# Patient Record
Sex: Male | Born: 1970 | Race: White | Hispanic: No | Marital: Married | Smoking: Never smoker
Health system: Southern US, Community
[De-identification: ages and names within clinical notes are randomized; demographics above are authoritative.]

## PROBLEM LIST (undated history)

## (undated) DIAGNOSIS — M545 Low back pain, unspecified: Secondary | ICD-10-CM

## (undated) DIAGNOSIS — M5136 Other intervertebral disc degeneration, lumbar region: Secondary | ICD-10-CM

## (undated) DIAGNOSIS — M51369 Other intervertebral disc degeneration, lumbar region without mention of lumbar back pain or lower extremity pain: Secondary | ICD-10-CM

## (undated) DIAGNOSIS — I10 Essential (primary) hypertension: Secondary | ICD-10-CM

## (undated) DIAGNOSIS — G43909 Migraine, unspecified, not intractable, without status migrainosus: Secondary | ICD-10-CM

## (undated) DIAGNOSIS — E669 Obesity, unspecified: Secondary | ICD-10-CM

## (undated) DIAGNOSIS — F0781 Postconcussional syndrome: Secondary | ICD-10-CM

## (undated) HISTORY — DX: Obesity, unspecified: E66.9

## (undated) HISTORY — DX: Low back pain, unspecified: M54.50

## (undated) HISTORY — DX: Migraine, unspecified, not intractable, without status migrainosus: G43.909

## (undated) HISTORY — DX: Postconcussional syndrome: F07.81

## (undated) HISTORY — DX: Other intervertebral disc degeneration, lumbar region without mention of lumbar back pain or lower extremity pain: M51.369

## (undated) HISTORY — DX: Essential (primary) hypertension: I10

## (undated) HISTORY — DX: Low back pain: M54.5

## (undated) HISTORY — DX: Other intervertebral disc degeneration, lumbar region: M51.36

---

## 2009-09-25 ENCOUNTER — Emergency Department (HOSPITAL_COMMUNITY): Admission: EM | Admit: 2009-09-25 | Discharge: 2009-09-25 | Payer: Self-pay | Admitting: Emergency Medicine

## 2011-02-08 ENCOUNTER — Emergency Department: Payer: Self-pay | Admitting: Emergency Medicine

## 2012-12-16 DIAGNOSIS — G43709 Chronic migraine without aura, not intractable, without status migrainosus: Secondary | ICD-10-CM | POA: Insufficient documentation

## 2013-01-03 ENCOUNTER — Ambulatory Visit (INDEPENDENT_AMBULATORY_CARE_PROVIDER_SITE_OTHER): Payer: BC Managed Care – PPO | Admitting: Family Medicine

## 2013-01-03 VITALS — BP 128/96 | HR 76

## 2013-01-03 DIAGNOSIS — I10 Essential (primary) hypertension: Secondary | ICD-10-CM

## 2013-01-03 NOTE — Progress Notes (Signed)
Patient ID: Darren Baker, male   DOB: [birth year], 42 y.o.   MRN: [id number] Pt here for nurse visit BP check.

## 2013-05-12 ENCOUNTER — Telehealth: Payer: Self-pay | Admitting: Family Medicine

## 2013-05-13 NOTE — Telephone Encounter (Signed)
Pt had question about Hepatitis.  Also has started Twinrix series and did not complete.  Told OK to come get last vaccine of series

## 2013-05-13 NOTE — Telephone Encounter (Signed)
lmtrc

## 2013-05-14 ENCOUNTER — Other Ambulatory Visit: Payer: Self-pay | Admitting: Family Medicine

## 2013-05-16 ENCOUNTER — Ambulatory Visit (INDEPENDENT_AMBULATORY_CARE_PROVIDER_SITE_OTHER): Payer: BC Managed Care – PPO | Admitting: Family Medicine

## 2013-05-16 VITALS — BP 126/92

## 2013-05-16 DIAGNOSIS — E669 Obesity, unspecified: Secondary | ICD-10-CM

## 2013-05-16 DIAGNOSIS — Z79899 Other long term (current) drug therapy: Secondary | ICD-10-CM

## 2013-05-16 DIAGNOSIS — Z23 Encounter for immunization: Secondary | ICD-10-CM

## 2013-05-16 DIAGNOSIS — Z0000 Encounter for general adult medical examination without abnormal findings: Secondary | ICD-10-CM

## 2013-05-16 DIAGNOSIS — I10 Essential (primary) hypertension: Secondary | ICD-10-CM

## 2013-05-16 NOTE — Progress Notes (Signed)
Patient ID: Darren Baker, male   DOB: [birth year], 42 y.o.   MRN: [id number] Patient here to get third Twinrix vaccine (HepA/HepB).  Also for BP check.  Due for annual exam end of September.  Appointment made.  Future routine labs orders placed to be completed prior to OV.  Also wants Hep B antibodies check post completion of vaccinations.  That order also placed.

## 2013-07-04 ENCOUNTER — Encounter: Payer: Self-pay | Admitting: Family Medicine

## 2013-07-04 ENCOUNTER — Ambulatory Visit (INDEPENDENT_AMBULATORY_CARE_PROVIDER_SITE_OTHER): Payer: BC Managed Care – PPO | Admitting: Family Medicine

## 2013-07-04 VITALS — BP 122/86

## 2013-07-04 DIAGNOSIS — I10 Essential (primary) hypertension: Secondary | ICD-10-CM

## 2013-07-04 DIAGNOSIS — Z79899 Other long term (current) drug therapy: Secondary | ICD-10-CM

## 2013-07-04 DIAGNOSIS — E669 Obesity, unspecified: Secondary | ICD-10-CM

## 2013-07-04 DIAGNOSIS — Z0000 Encounter for general adult medical examination without abnormal findings: Secondary | ICD-10-CM

## 2013-07-04 DIAGNOSIS — Z23 Encounter for immunization: Secondary | ICD-10-CM

## 2013-07-04 LAB — COMPLETE METABOLIC PANEL WITH GFR
ALT: 19 U/L (ref 0–53)
AST: 19 U/L (ref 0–37)
Albumin: 4 g/dL (ref 3.5–5.2)
CO2: 28 mEq/L (ref 19–32)
Calcium: 9.4 mg/dL (ref 8.4–10.5)
Chloride: 105 mEq/L (ref 96–112)
Creat: 1.02 mg/dL (ref 0.50–1.35)
GFR, Est African American: >89 mL/min
Potassium: 4.4 mEq/L (ref 3.5–5.3)

## 2013-07-04 LAB — CBC WITH DIFFERENTIAL/PLATELET
Eosinophils Absolute: 0.4 10*3/uL (ref 0.0–0.7)
Eosinophils Relative: 5 % (ref 0–5)
Hemoglobin: 14.3 g/dL (ref 13.0–17.0)
Lymphocytes Relative: 26 % (ref 12–46)
Lymphs Abs: 2.1 10*3/uL (ref 0.7–4.0)
MCH: 29.1 pg (ref 26.0–34.0)
MCV: 82.9 fL (ref 78.0–100.0)
Monocytes Relative: 8 % (ref 3–12)
Platelets: 295 10*3/uL (ref 150–400)
RBC: 4.91 MIL/uL (ref 4.22–5.81)
WBC: 8 10*3/uL (ref 4.0–10.5)

## 2013-07-04 LAB — TSH: TSH: 2.557 u[IU]/mL (ref 0.350–4.500)

## 2013-07-04 LAB — LIPID PANEL: Cholesterol: 125 mg/dL (ref 0–200)

## 2013-07-11 ENCOUNTER — Encounter: Payer: Self-pay | Admitting: Family Medicine

## 2013-07-11 ENCOUNTER — Ambulatory Visit (INDEPENDENT_AMBULATORY_CARE_PROVIDER_SITE_OTHER): Payer: BC Managed Care – PPO | Admitting: Family Medicine

## 2013-07-11 VITALS — BP 120/98 | HR 72 | Temp 97.7°F | Resp 18 | Ht 71.0 in | Wt 275.0 lb

## 2013-07-11 DIAGNOSIS — Z0000 Encounter for general adult medical examination without abnormal findings: Secondary | ICD-10-CM

## 2013-07-11 DIAGNOSIS — E669 Obesity, unspecified: Secondary | ICD-10-CM | POA: Insufficient documentation

## 2013-07-11 DIAGNOSIS — M5136 Other intervertebral disc degeneration, lumbar region: Secondary | ICD-10-CM | POA: Insufficient documentation

## 2013-07-11 DIAGNOSIS — I10 Essential (primary) hypertension: Secondary | ICD-10-CM | POA: Insufficient documentation

## 2013-07-11 DIAGNOSIS — G43909 Migraine, unspecified, not intractable, without status migrainosus: Secondary | ICD-10-CM | POA: Insufficient documentation

## 2013-07-11 DIAGNOSIS — F0781 Postconcussional syndrome: Secondary | ICD-10-CM | POA: Insufficient documentation

## 2013-07-11 DIAGNOSIS — M545 Low back pain, unspecified: Secondary | ICD-10-CM | POA: Insufficient documentation

## 2013-07-11 MED ORDER — MELOXICAM 7.5 MG PO TABS: 7.5000 mg | ORAL_TABLET | Freq: Every day | ORAL | Status: DC

## 2013-07-11 MED ORDER — LOSARTAN POTASSIUM 50 MG PO TABS: 50.0000 mg | ORAL_TABLET | Freq: Every day | ORAL | Status: DC

## 2013-07-11 MED ORDER — HYDROCODONE-ACETAMINOPHEN 5-325 MG PO TABS: 1.0000 | ORAL_TABLET | Freq: Four times a day (QID) | ORAL | Status: DC | PRN

## 2013-07-11 MED ORDER — ORLISTAT 120 MG PO CAPS: 120.0000 mg | ORAL_CAPSULE | Freq: Three times a day (TID) | ORAL | Status: DC

## 2013-07-11 NOTE — Progress Notes (Signed)
Subjective:    Patient ID: Darren Baker, male    DOB: 1971-07-26, 42 y.o.   MRN: [id number]  HPI Patient is here today for complete physical exam.  He continues to have daily headaches. He seen by neurologist. He denied asthma migraines status post postconcussion syndrome. He is failed numerous medications for this. He reports a headache every morning when he awakens. It is a dull constant headache. He is extremely irritable. He also felt like he does not sleep very well. He is very fatigued and tired all the time. He's never been screened for sleep apnea.  He's gained 40 pounds over the last year since he is married. This could be contributing also to his fatigue. His blood pressure is also significantly elevated. He denies any chest pain although he does report some dyspnea on exertion. Past Medical History  Diagnosis Date  . Migraine   . Low back pain   . Obesity   . DDD (degenerative disc disease), lumbar   . Post concussion syndrome   . Hypertension    Current Outpatient Prescriptions on File Prior to Visit  Medication Sig Dispense Refill  . hydrochlorothiazide (HYDRODIURIL) 25 MG tablet TAKE 1 TABLET BY MOUTH DAILY  90 tablet  1   No current facility-administered medications on file prior to visit.   No Known Allergies History   Social History  . Marital Status: Single    Spouse Name: N/A    Number of Children: N/A  . Years of Education: N/A   Occupational History  . Not on file.   Social History Main Topics  . Smoking status: Never Smoker   . Smokeless tobacco: Never Used  . Alcohol Use: Yes     Comment: Rare  . Drug Use: No  . 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   Other Topics Concern  . Not on file   Social History Narrative  . No narrative on file   Family History  Problem Relation Age of Onset  . Diabetes Father   . Heart disease Father   . Hyperlipidemia Father   . Hypertension Father       Review of  Systems  All other systems reviewed and are negative.       Objective:   Physical Exam  Vitals reviewed. Constitutional: He is oriented to person, place, and time. He appears well-developed and well-nourished. No distress.  HENT:  Head: Normocephalic and atraumatic.  Right Ear: External ear normal.  Left Ear: External ear normal.  Nose: Nose normal.  Mouth/Throat: Oropharynx is clear and moist. No oropharyngeal exudate.  Eyes: Conjunctivae are normal. Pupils are equal, round, and reactive to light. Right eye exhibits no discharge. Left eye exhibits no discharge. No scleral icterus.  Neck: Normal range of motion. Neck supple. No JVD present. No tracheal deviation present. No thyromegaly present.  Cardiovascular: Normal rate, regular rhythm, normal heart sounds and intact distal pulses.  Exam reveals no gallop and no friction rub.   No murmur heard. Pulmonary/Chest: Effort normal and breath sounds normal. No stridor. No respiratory distress. He has no wheezes. He has no rales. He exhibits no tenderness.  Abdominal: Soft. Bowel sounds are normal. He exhibits no distension and no mass. There is no tenderness. There is no rebound and no guarding.  Genitourinary: Penis normal. No penile tenderness.  Musculoskeletal: Normal range of motion. He exhibits no edema and no tenderness.  Lymphadenopathy:    He has no  cervical adenopathy.  Neurological: He is alert and oriented to person, place, and time. He has normal reflexes. He displays normal reflexes. No cranial nerve deficit. He exhibits normal muscle tone. Coordination normal.  Skin: Skin is warm. No rash noted. He is not diaphoretic. No erythema. No pallor.  Psychiatric: He has a normal mood and affect. His behavior is normal. Judgment and thought content normal.   patient has a 2 cm subcutaneous mass in the suprapubic region it feels like a sebaceous cyst. It does not enlarge with Valsalva maneuver and therefore I do not feel like a  hernia.        Assessment & Plan:  1. Routine general medical examination at a health care facility Add  losartan 50 mg by mouth daily. Recheck blood pressure in one month. I recommended a sleep study to evaluate for obstructive sleep apnea given his daily headaches, his obesity, and hypertension. The patient will consider that. I reviewed the remainder of his labs with the patient they're listed below and are normal: Clinical Support on 07/04/2013  Component Date Value Range Status  . Cholesterol 07/04/2013 125  0 - 200 mg/dL Final   Comment: ATP III Classification:                                < 200        mg/dL        Desirable                               200 - 239     mg/dL        Borderline High                               >= 240        mg/dL        High                             . Triglycerides 07/04/2013 97  <150 mg/dL Final  . HDL 16/07/9603 46  >39 mg/dL Final  . Total CHOL/HDL Ratio 07/04/2013 2.7   Final  . VLDL 07/04/2013 19  0 - 40 mg/dL Final  . LDL Cholesterol 07/04/2013 60  0 - 99 mg/dL Final   Comment:                            Total Cholesterol/HDL Ratio:CHD Risk                                                 Coronary Heart Disease Risk Table                                                                 Men       Women  1/2 Average Risk              3.4        3.3                                       Average Risk              5.0        4.4                                    2X Average Risk              9.6        7.1                                    3X Average Risk             23.4       11.0                          Use the calculated Patient Ratio above and the CHD Risk table                           to determine the patient's CHD Risk.                          ATP III Classification (LDL):                                < 100        mg/dL         Optimal                               100 - 129     mg/dL          Near or Above Optimal                               130 - 159     mg/dL         Borderline High                               160 - 189     mg/dL         High                                > 190        mg/dL         Very High                             . WBC 07/04/2013 8.0  4.0 - 10.5 K/uL Final  . RBC 07/04/2013 4.91  4.22 - 5.81 MIL/uL Final  .  Hemoglobin 07/04/2013 14.3  13.0 - 17.0 g/dL Final  . HCT 16/07/9603 40.7  39.0 - 52.0 % Final  . MCV 07/04/2013 82.9  78.0 - 100.0 fL Final  . MCH 07/04/2013 29.1  26.0 - 34.0 pg Final  . MCHC 07/04/2013 35.1  30.0 - 36.0 g/dL Final  . RDW 54/06/8118 13.5  11.5 - 15.5 % Final  . Platelets 07/04/2013 295  150 - 400 K/uL Final  . Neutrophils Relative % 07/04/2013 60  43 - 77 % Final  . Neutro Abs 07/04/2013 4.9  1.7 - 7.7 K/uL Final  . Lymphocytes Relative 07/04/2013 26  12 - 46 % Final  . Lymphs Abs 07/04/2013 2.1  0.7 - 4.0 K/uL Final  . Monocytes Relative 07/04/2013 8  3 - 12 % Final  . Monocytes Absolute 07/04/2013 0.6  0.1 - 1.0 K/uL Final  . Eosinophils Relative 07/04/2013 5  0 - 5 % Final  . Eosinophils Absolute 07/04/2013 0.4  0.0 - 0.7 K/uL Final  . Basophils Relative 07/04/2013 1  0 - 1 % Final  . Basophils Absolute 07/04/2013 0.0  0.0 - 0.1 K/uL Final  . Smear Review 07/04/2013 Criteria for review not met   Final  . TSH 07/04/2013 2.557  0.350 - 4.500 uIU/mL Final  . Sodium 07/04/2013 140  135 - 145 mEq/L Final  . Potassium 07/04/2013 4.4  3.5 - 5.3 mEq/L Final  . Chloride 07/04/2013 105  96 - 112 mEq/L Final  . CO2 07/04/2013 28  19 - 32 mEq/L Final  . Glucose, Bld 07/04/2013 101* 70 - 99 mg/dL Final  . BUN 14/78/2956 14  6 - 23 mg/dL Final  . Creat 21/30/8657 1.02  0.50 - 1.35 mg/dL Final  . Total Bilirubin 07/04/2013 0.5  0.3 - 1.2 mg/dL Final  . Alkaline Phosphatase 07/04/2013 77  39 - 117 U/L Final  . AST 07/04/2013 19  0 - 37 U/L Final  . ALT 07/04/2013 19  0 - 53 U/L Final  . Total Protein 07/04/2013 6.3  6.0 - 8.3  g/dL Final  . Albumin 84/69/6295 4.0  3.5 - 5.2 g/dL Final  . Calcium 28/41/3244 9.4  8.4 - 10.5 mg/dL Final  . GFR, Est African American 07/04/2013 >89   Final  . GFR, Est Non African American 07/04/2013 89   Final   Comment:                            The estimated GFR is a calculation valid for adults (>=3 years old)                          that uses the CKD-EPI algorithm to adjust for age and sex. It is                            not to be used for children, pregnant women, hospitalized patients,                             patients on dialysis, or with rapidly changing kidney function.                          According to the NKDEP, eGFR >89 is normal, 60-89 shows mild  impairment, 30-59 shows moderate impairment, 15-29 shows severe                          impairment and <15 is ESRD.                             Marland Kitchen Hepatitis B-Post 07/04/2013 45.1   Final   Comment: A level of 10.0 mIU/mL or greater after 3 doses of Hepatitis B Vaccine                          suggests immunity to Hepatitis B.                                                     This test is performed using the [employer] Chemiluminescence                          method.  Quantitative results from this method should not be used                          interchangeably with other methods.   I recommended diet exercise and weight loss. I started the patient on orlistat 120 mg by mouth 3 times a day to help with weight loss. I also refilled his meloxicam 7.5 mg by mouth daily for low back pain. I cautioned the patient has gastrointestinal side effects. I also gave him a prescription for Norco 5/325 one by mouth every 6 hours to be used as needed for severe pain. I gave the patient 60 tablets. His previous prescription lasted 10 months. He uses the medicine very sparingly.

## 2013-07-17 ENCOUNTER — Ambulatory Visit: Payer: BC Managed Care – PPO | Admitting: *Deleted

## 2013-07-17 VITALS — BP 110/80

## 2013-07-17 DIAGNOSIS — Z0130 Encounter for examination of blood pressure without abnormal findings: Secondary | ICD-10-CM

## 2013-07-22 ENCOUNTER — Telehealth: Payer: Self-pay | Admitting: Family Medicine

## 2013-07-22 NOTE — Telephone Encounter (Signed)
Patient is concerned about the BP meds. That he has been put on recently. COZARR 50 mg  . Patient's BP is much lower. But now he is feeling weak and tired. Does he need to take half dose or continue with 50 mg ? Please call and advise .  Patient is also C/O weakness in his legs.

## 2013-07-22 NOTE — Telephone Encounter (Signed)
Has been sent to insurance. Have not heard back from them.

## 2013-07-22 NOTE — Telephone Encounter (Signed)
Prior Auth . For his Xenical needed.    [facility]  [address]   [phone]

## 2013-07-23 NOTE — Telephone Encounter (Signed)
Called pt back he advised me that his BP has been running low last couple of days, he went to pharmacy and they told heim to try taking half of the 50mg , I told him that was fine but to monitor his BP write them down and give [place] a call to see if we need to adjust his meds. He will cal me back in a week.

## 2013-11-11 ENCOUNTER — Other Ambulatory Visit: Payer: Self-pay | Admitting: Family Medicine

## 2013-12-21 ENCOUNTER — Other Ambulatory Visit: Payer: Self-pay | Admitting: Family Medicine

## 2013-12-21 DIAGNOSIS — I10 Essential (primary) hypertension: Secondary | ICD-10-CM

## 2013-12-22 ENCOUNTER — Encounter: Payer: Self-pay | Admitting: Family Medicine

## 2013-12-22 NOTE — Telephone Encounter (Signed)
Medication refill for one time only.  Patient needs to be seen.  Letter sent for patient to call and schedule 

## 2014-01-06 ENCOUNTER — Telehealth: Payer: Self-pay | Admitting: Family Medicine

## 2014-01-06 NOTE — Telephone Encounter (Signed)
Received PA request from pharmacy for [place].  Pt has been receiving since October 2014 and has been paying Insurance co-pay.  Is on next to last refill of that RX.  Now insurance is requesting PA??  PA submitted thru "Cover My Meds"  #[id number]

## 2014-01-07 ENCOUNTER — Ambulatory Visit: Payer: BC Managed Care – PPO | Admitting: Family Medicine

## 2014-01-07 VITALS — BP 108/74

## 2014-01-07 DIAGNOSIS — Z0130 Encounter for examination of blood pressure without abnormal findings: Secondary | ICD-10-CM

## 2014-01-07 NOTE — Patient Instructions (Signed)
Instructed pt to continue Atenolol 25mg  1/2 tab po qd and follow up with Dr.Pickard

## 2014-01-07 NOTE — Telephone Encounter (Signed)
rec'd message from [facility].  Told PA needs to go through [employer].  [employer] PA form completed and faxed.

## 2014-01-12 NOTE — Telephone Encounter (Signed)
Pt called back and states his current weight now is 245lb.  Down 30 lbs from 275lb at CPE in October.  BMI now 35

## 2014-01-12 NOTE — Telephone Encounter (Signed)
Received form today from [employer] that needs completing.  I need current patient's weight.  Have called and leff message for him to call me back.

## 2014-01-16 NOTE — Telephone Encounter (Signed)
Yet another form from [employer] today.  Form completed and returned

## 2014-01-20 NOTE — Telephone Encounter (Signed)
Finally have received approval for Xenical.  App good from 12/14/13 thru 01/20/15  Case ID# [id number].  Pt made aware

## 2014-01-23 ENCOUNTER — Encounter: Payer: Self-pay | Admitting: Family Medicine

## 2014-01-23 ENCOUNTER — Ambulatory Visit (INDEPENDENT_AMBULATORY_CARE_PROVIDER_SITE_OTHER): Payer: BC Managed Care – PPO | Admitting: Family Medicine

## 2014-01-23 VITALS — BP 100/62 | HR 80 | Temp 97.4°F | Resp 16 | Ht 71.0 in | Wt 248.0 lb

## 2014-01-23 DIAGNOSIS — I10 Essential (primary) hypertension: Secondary | ICD-10-CM

## 2014-01-23 MED ORDER — HYDROCODONE-ACETAMINOPHEN 5-325 MG PO TABS: 1.0000 | ORAL_TABLET | Freq: Four times a day (QID) | ORAL | Status: DC | PRN

## 2014-01-23 NOTE — Progress Notes (Signed)
Subjective:    Patient ID: Darren Baker, male    DOB: 1971/05/05, 43 y.o.   MRN: [id number]  HPI Patient is here today for recheck of his blood pressure. Since I last saw the patient, his neurologist discontinued [place] in and have the patient start atenolol 25 mg by mouth daily for headache prevention. The patient has only been able to take half of the atenolol tablet due to low bp.  He is also on hydrochlorothiazide which he had been on for years for his high blood pressure. Today his blood pressure is low at 100/62. He does think the atenolol is helping his headaches. He still complains of low back pain and neck pain. He is having MRI scan 7.5 mg twice a day. We had a discussion about a possible gastrointestinal risk of doing that including ulcers and gastritis. The patient is not currently taking anything for GI prophylaxis he does request a refill on his hydrocodone. I gave him 60 tablets in October. He still has 10 tablets remaining. He uses sparingly for when his back pain is severe. Past Medical History  Diagnosis Date  . Migraine   . Low back pain   . Obesity   . DDD (degenerative disc disease), lumbar   . Post concussion syndrome   . Hypertension    No past surgical history on file. Current Outpatient Prescriptions on File Prior to Visit  Medication Sig Dispense Refill  . eletriptan (RELPAX) 40 MG tablet Take 40 mg by mouth as needed for migraine. One tablet by mouth at onset of headache. May repeat in 2 hours if headache persists or recurs.      . hydrochlorothiazide (HYDRODIURIL) 25 MG tablet TAKE 1 TABLET BY MOUTH DAILY  90 tablet  1  . ibuprofen (ADVIL,MOTRIN) 800 MG tablet Take 800 mg by mouth every 8 (eight) hours as needed for pain.      . meloxicam (MOBIC) 7.5 MG tablet Take 1 tablet (7.5 mg total) by mouth daily.  30 tablet  5  . orlistat (XENICAL) 120 MG capsule Take 1 capsule (120 mg total) by mouth 3 (three) times daily with meals.  90 capsule  5   No current  facility-administered medications on file prior to visit.   No Known Allergies History   Social History  . Marital Status: Single    Spouse Name: N/A    Number of Children: N/A  . Years of Education: N/A   Occupational History  . Not on file.   Social History Main Topics  . Smoking status: Never Smoker   . Smokeless tobacco: Never Used  . Alcohol Use: Yes     Comment: Rare  . Drug Use: No  . Sexual Activity: Not on file     Comment: Married, works for Building surveyor.   Other Topics Concern  . Not on file   Social History Narrative  . No narrative on file      Review of Systems  All other systems reviewed and are negative.      Objective:   Physical Exam  Vitals reviewed. Constitutional: He appears well-developed and well-nourished.  Cardiovascular: Normal rate, regular rhythm, normal heart sounds and intact distal pulses.  Exam reveals no gallop and no friction rub.   No murmur heard. Pulmonary/Chest: Effort normal and breath sounds normal. No respiratory distress. He has no wheezes. He has no rales. He exhibits no tenderness.  Abdominal: Soft. Bowel sounds are normal. He exhibits no distension. There is no  tenderness. There is no rebound and no guarding.          Assessment & Plan:  1. HTN (hypertension) Blood pressure is low. He recommended that the patient discontinue hydrochlorothiazide. I recommended that he increase atenolol to 25 mg by mouth daily. I like the patient to return fasting so that I can check a CBC, CMP, and fasting lipid panel.

## 2014-02-13 ENCOUNTER — Other Ambulatory Visit: Payer: BC Managed Care – PPO

## 2014-02-13 ENCOUNTER — Ambulatory Visit: Payer: BC Managed Care – PPO | Admitting: *Deleted

## 2014-02-13 VITALS — BP 120/86

## 2014-02-13 DIAGNOSIS — I10 Essential (primary) hypertension: Secondary | ICD-10-CM

## 2014-02-13 DIAGNOSIS — Z0130 Encounter for examination of blood pressure without abnormal findings: Secondary | ICD-10-CM

## 2014-02-13 LAB — COMPLETE METABOLIC PANEL WITH GFR
ALBUMIN: 4.2 g/dL (ref 3.5–5.2)
ALT: 17 U/L (ref 0–53)
AST: 18 U/L (ref 0–37)
Alkaline Phosphatase: 87 U/L (ref 39–117)
BUN: 23 mg/dL (ref 6–23)
CALCIUM: 9.7 mg/dL (ref 8.4–10.5)
CHLORIDE: 105 meq/L (ref 96–112)
CO2: 25 mEq/L (ref 19–32)
Creat: 1.17 mg/dL (ref 0.50–1.35)
GFR, Est African American: 87 mL/min
GFR, Est Non African American: 75 mL/min
Glucose, Bld: 90 mg/dL (ref 70–99)
POTASSIUM: 4.6 meq/L (ref 3.5–5.3)
SODIUM: 139 meq/L (ref 135–145)
TOTAL PROTEIN: 6.3 g/dL (ref 6.0–8.3)
Total Bilirubin: 0.6 mg/dL (ref 0.2–1.2)

## 2014-02-13 LAB — CBC WITH DIFFERENTIAL/PLATELET
BASOS PCT: 1 % (ref 0–1)
Basophils Absolute: 0.1 10*3/uL (ref 0.0–0.1)
Eosinophils Absolute: 0.6 10*3/uL (ref 0.0–0.7)
Eosinophils Relative: 8 % — ABNORMAL HIGH (ref 0–5)
HCT: 40.6 % (ref 39.0–52.0)
HEMOGLOBIN: 14 g/dL (ref 13.0–17.0)
LYMPHS ABS: 1.8 10*3/uL (ref 0.7–4.0)
Lymphocytes Relative: 24 % (ref 12–46)
MCH: 29 pg (ref 26.0–34.0)
MCHC: 34.5 g/dL (ref 30.0–36.0)
MCV: 84.1 fL (ref 78.0–100.0)
MONOS PCT: 9 % (ref 3–12)
Monocytes Absolute: 0.7 10*3/uL (ref 0.1–1.0)
NEUTROS ABS: 4.4 10*3/uL (ref 1.7–7.7)
NEUTROS PCT: 58 % (ref 43–77)
PLATELETS: 261 10*3/uL (ref 150–400)
RBC: 4.83 MIL/uL (ref 4.22–5.81)
RDW: 13.8 % (ref 11.5–15.5)
WBC: 7.5 10*3/uL (ref 4.0–10.5)

## 2014-02-13 LAB — LIPID PANEL
Cholesterol: 135 mg/dL (ref 0–200)
HDL: 42 mg/dL (ref >39)
LDL CALC: 74 mg/dL (ref 0–99)
Total CHOL/HDL Ratio: 3.2 Ratio
Triglycerides: 95 mg/dL (ref <150)
VLDL: 19 mg/dL (ref 0–40)

## 2014-02-13 NOTE — Progress Notes (Signed)
Pt BP is 120/86 today, came in for nurse visit for BP check and lab work, pt states that he believes Dr. Dennard Schaumann took him off Atenolol and HCTZ and that his neurologist put him on some other BP medication but is not sure what name of it is. I advised pt to call me and let me know when he got home what medication he was stating about. He agrees to call.

## 2014-02-16 ENCOUNTER — Encounter: Payer: Self-pay | Admitting: Family Medicine

## 2014-06-11 ENCOUNTER — Other Ambulatory Visit: Payer: Self-pay | Admitting: Family Medicine

## 2014-06-11 NOTE — Telephone Encounter (Signed)
Ok to refill 

## 2014-06-12 NOTE — Telephone Encounter (Signed)
RX sent

## 2014-06-12 NOTE — Telephone Encounter (Signed)
ok 

## 2014-08-06 ENCOUNTER — Ambulatory Visit (INDEPENDENT_AMBULATORY_CARE_PROVIDER_SITE_OTHER): Payer: BC Managed Care – PPO | Admitting: *Deleted

## 2014-08-06 ENCOUNTER — Telehealth: Payer: Self-pay | Admitting: *Deleted

## 2014-08-06 ENCOUNTER — Encounter: Payer: Self-pay | Admitting: Family Medicine

## 2014-08-06 VITALS — BP 142/78 | HR 76 | Resp 16

## 2014-08-06 DIAGNOSIS — Z23 Encounter for immunization: Secondary | ICD-10-CM

## 2014-08-06 NOTE — Progress Notes (Signed)
Patient ID: Darren Baker, male   DOB: [birth year], 43 y.o.   MRN: [id number] Patient seen in office for Influenza Vaccination and BP check.   Tolerated IM administration well.   Patient has change medications per Neurologist. Medication list updated.   BP noted as 142/ 78.
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
## 2014-10-01 ENCOUNTER — Telehealth: Payer: Self-pay | Admitting: *Deleted

## 2014-10-01 MED ORDER — AZITHROMYCIN 250 MG PO TABS: ORAL_TABLET | ORAL | Status: DC

## 2014-10-01 NOTE — Telephone Encounter (Signed)
Call placed to patient and patient made aware.  

## 2014-10-01 NOTE — Telephone Encounter (Signed)
Received call from patient.   Reports that he has head congestion and cough x2 weeks. States that it started out with sore throat, but that has since resolved. States that cough is productive with yellow/ green mucus. States that he has head congestion.   Reports that he has been gargling salt water and has been using Mucinex DM.   Requested MD recommendations.   MD please advise.

## 2014-10-01 NOTE — Telephone Encounter (Signed)
Try z pack.   

## 2015-01-01 ENCOUNTER — Ambulatory Visit (INDEPENDENT_AMBULATORY_CARE_PROVIDER_SITE_OTHER): Payer: BC Managed Care – PPO | Admitting: Family Medicine

## 2015-01-01 ENCOUNTER — Encounter: Payer: Self-pay | Admitting: Family Medicine

## 2015-01-01 VITALS — BP 118/86 | HR 100 | Temp 98.9°F | Resp 18 | Ht 71.0 in | Wt 267.0 lb

## 2015-01-01 DIAGNOSIS — J019 Acute sinusitis, unspecified: Secondary | ICD-10-CM | POA: Diagnosis not present

## 2015-01-01 DIAGNOSIS — J029 Acute pharyngitis, unspecified: Secondary | ICD-10-CM | POA: Diagnosis not present

## 2015-01-01 LAB — RAPID STREP SCREEN (MED CTR MEBANE ONLY): STREPTOCOCCUS, GROUP A SCREEN (DIRECT): NEGATIVE

## 2015-01-01 MED ORDER — AMOXICILLIN 875 MG PO TABS
875.0000 mg | ORAL_TABLET | Freq: Two times a day (BID) | ORAL | Status: DC
Start: 2015-01-01 — End: 2015-07-16

## 2015-01-01 NOTE — Progress Notes (Signed)
   Subjective:    Patient ID: Darren Baker, male    DOB: [birth year], 44 y.o.   MRN: [id number]  HPI  Patient resents with over a week of sore throat, sinus pressure, postnasal drip, eustachian tube dysfunction, left ear pain, and subjective fevers. Strep test today in office is negative. Patient is tender to palpation over his left maxillary sinus. He is has a severe sore throat on the left side of his throat. He denies any trismus. Past Medical History  Diagnosis Date  . Migraine   . Low back pain   . Obesity   . DDD (degenerative disc disease), lumbar   . Post concussion syndrome   . Hypertension    No past surgical history on file. Current Outpatient Prescriptions on File Prior to Visit  Medication Sig Dispense Refill  . eletriptan (RELPAX) 40 MG tablet Take 40 mg by mouth as needed for migraine. One tablet by mouth at onset of headache. May repeat in 2 hours if headache persists or recurs.    . meloxicam (MOBIC) 7.5 MG tablet Take 1 tablet (7.5 mg total) by mouth daily. (Patient taking differently: Take 7.5-15 mg by mouth daily. ) 30 tablet 5   No current facility-administered medications on file prior to visit.   No Known Allergies History   Social History  . Marital Status: Single    Spouse Name: N/A  . Number of Children: N/A  . Years of Education: N/A   Occupational History  . Not on file.   Social History Main Topics  . Smoking status: Never Smoker   . Smokeless tobacco: Never Used  . Alcohol Use: Yes     Comment: Rare  . Drug Use: No  . Sexual Activity: Not on file     Comment: Married, works for Building surveyor.   Other Topics Concern  . Not on file   Social History Narrative     Review of Systems  All other systems reviewed and are negative.      Objective:   Physical Exam  Constitutional: He appears well-developed and well-nourished.  HENT:  Right Ear: Tympanic membrane, external ear and ear canal normal.  Left Ear: Tympanic membrane,  external ear and ear canal normal.  Nose: Mucosal edema and rhinorrhea present.  Mouth/Throat: Posterior oropharyngeal edema and posterior oropharyngeal erythema present.  Neck: Neck supple.  Cardiovascular: Normal rate, regular rhythm and normal heart sounds.   Pulmonary/Chest: Effort normal and breath sounds normal.  Lymphadenopathy:    He has no cervical adenopathy.  Vitals reviewed.         Assessment & Plan:  Sore throat - Plan: Rapid strep screen  Acute rhinosinusitis - Plan: amoxicillin (AMOXIL) 875 MG tablet  I believe the patient has a sinus infection causing postnasal drip which is irritating his throat. I believe it is causing the pain in his left maxillary sinus. I believe it is causing his eustachian tube dysfunction causing his left ear pain.  Begin amoxicillin 875 mg by mouth twice a day for 10 days

## 2015-01-20 ENCOUNTER — Telehealth: Payer: Self-pay | Admitting: Family Medicine

## 2015-01-20 NOTE — Telephone Encounter (Signed)
[phone] PT called and left a voicemail stating that his left ear and his throat is starting to hurt again really bad when he swallows and he believes his ear is stopped up. And is wanting to speak to you about this.

## 2015-01-20 NOTE — Telephone Encounter (Signed)
Spoke to pt and he states that he did get better on his antibx but now he is having left sided ear pain that radiates down from his ear to his throat with very sharp shooting pain in his throat and was wanting to know what to do? He is not using any nasal spray for ETD or congestion but feels better after taking sudafed. He also stated that he seen and ENT years ago and wonders if that would be an option?

## 2015-01-21 NOTE — Telephone Encounter (Signed)
Have him come in for eval.

## 2015-01-21 NOTE — Telephone Encounter (Signed)
Pt aware and appt made.

## 2015-01-22 ENCOUNTER — Ambulatory Visit (INDEPENDENT_AMBULATORY_CARE_PROVIDER_SITE_OTHER): Payer: BC Managed Care – PPO | Admitting: Family Medicine

## 2015-01-22 ENCOUNTER — Encounter: Payer: Self-pay | Admitting: Family Medicine

## 2015-01-22 VITALS — BP 116/68 | HR 86 | Temp 97.7°F | Resp 18 | Ht 71.0 in | Wt 261.0 lb

## 2015-01-22 DIAGNOSIS — J039 Acute tonsillitis, unspecified: Secondary | ICD-10-CM | POA: Diagnosis not present

## 2015-01-22 MED ORDER — CEFDINIR 300 MG PO CAPS: 300.0000 mg | ORAL_CAPSULE | Freq: Two times a day (BID) | ORAL | Status: DC

## 2015-01-22 MED ORDER — HYDROCODONE-HOMATROPINE 5-1.5 MG/5ML PO SYRP: 5.0000 mL | ORAL_SOLUTION | Freq: Three times a day (TID) | ORAL | Status: DC | PRN

## 2015-01-22 NOTE — Progress Notes (Signed)
Subjective:    Patient ID: Darren Baker, male    DOB: 06/20/1971, 44 y.o.   MRN: [id number]  HPI Please see last office visit. Patient continues to have pain in the left side of his throat. On examination today he does have a slightly swollen left posterior oropharynx with erythema. There is also white particulate matter stuck in the crypts on his residual tonsil. This is referring pain into his left ear. He has a difficult time swallowing food. He is difficult time chewing. He is tried Oceanographer mouthwash. He is tried Firefighter. He is tried ibuprofen and nothing seems to be helping. Past Medical History  Diagnosis Date  . Migraine   . Low back pain   . Obesity   . DDD (degenerative disc disease), lumbar   . Post concussion syndrome   . Hypertension    No past surgical history on file. Current Outpatient Prescriptions on File Prior to Visit  Medication Sig Dispense Refill  . amoxicillin (AMOXIL) 875 MG tablet Take 1 tablet (875 mg total) by mouth 2 (two) times daily. 20 tablet 0  . eletriptan (RELPAX) 40 MG tablet Take 40 mg by mouth as needed for migraine. One tablet by mouth at onset of headache. May repeat in 2 hours if headache persists or recurs.    Marland Kitchen HYDROcodone-acetaminophen (NORCO/VICODIN) 5-325 MG per tablet Take 1 tablet by mouth every 6 (six) hours as needed for moderate pain.    . meloxicam (MOBIC) 7.5 MG tablet Take 1 tablet (7.5 mg total) by mouth daily. (Patient taking differently: Take 7.5-15 mg by mouth daily. ) 30 tablet 5  . orlistat (XENICAL) 120 MG capsule Take 120 mg by mouth 3 (three) times daily with meals.    . verapamil (CALAN-SR) 180 MG CR tablet Take 180 mg by mouth.     No current facility-administered medications on file prior to visit.   No Known Allergies History   Social History  . Marital Status: Single    Spouse Name: N/A  . Number of Children: N/A  . Years of Education: N/A   Occupational History  . Not on file.   Social History Main  Topics  . Smoking status: Never Smoker   . Smokeless tobacco: Never Used  . Alcohol Use: Yes     Comment: Rare  . Drug Use: No  . Sexual Activity: Not on file     Comment: Married, works for Building surveyor.   Other Topics Concern  . Not on file   Social History Narrative      Review of Systems  All other systems reviewed and are negative.      Objective:   Physical Exam  Constitutional: He appears well-developed and well-nourished.  HENT:  Right Ear: External ear normal.  Left Ear: External ear normal.  Nose: Nose normal.  Mouth/Throat: Oropharyngeal exudate and posterior oropharyngeal erythema present.  Cardiovascular: Normal rate, regular rhythm and normal heart sounds.   Pulmonary/Chest: Effort normal and breath sounds normal. No respiratory distress. He has no wheezes. He has no rales.  Vitals reviewed.         Assessment & Plan:  Acute tonsillitis - Plan: cefdinir (OMNICEF) 300 MG capsule, HYDROcodone-homatropine (HYCODAN) 5-1.5 MG/5ML syrup  Patient apparently has tonsillitis that has not improved with amoxicillin. Switch the patient Omnicef 300 mg by mouth twice a day for 10 days. He has been using Hycodan for the pain due to the sore throat. I did give him a refill on this prescription  as this does seem to help with his throat more than the Chloraseptic. Recheck next week if no better

## 2015-01-26 ENCOUNTER — Ambulatory Visit: Payer: BC Managed Care – PPO | Admitting: Family Medicine

## 2015-01-26 ENCOUNTER — Telehealth: Payer: Self-pay | Admitting: *Deleted

## 2015-01-26 NOTE — Telephone Encounter (Signed)
Received request from pharmacy for PA on Xenical.   PA submitted.   Dx: E66.9  BMI: 36.5

## 2015-01-27 NOTE — Telephone Encounter (Signed)
Received PA determination.   PA approved 12/27/2014- 01/26/2016.  Case ID: [id number].  Pharmacy made aware.

## 2015-07-08 ENCOUNTER — Ambulatory Visit: Payer: BC Managed Care – PPO | Admitting: *Deleted

## 2015-07-08 ENCOUNTER — Encounter: Payer: Self-pay | Admitting: *Deleted

## 2015-07-08 VITALS — BP 132/88

## 2015-07-08 NOTE — Progress Notes (Signed)
Patient ID: Darren Baker, male   DOB: 1971-09-23, 44 y.o.   MRN: [id number]  Patient seen in office for BP check.   States that BP had been elevated at last neurology appointment, and he has been checking infrequently at home.   Advised by neurology to have BP checked x2 weeks and if BP remains elevated, HTN medications would be changed.   BP noted at 132/88.

## 2015-07-16 ENCOUNTER — Encounter: Payer: Self-pay | Admitting: Family Medicine

## 2015-07-16 ENCOUNTER — Encounter: Payer: BC Managed Care – PPO | Admitting: Family Medicine

## 2015-07-16 ENCOUNTER — Other Ambulatory Visit: Payer: Self-pay | Admitting: Family Medicine

## 2015-07-16 ENCOUNTER — Other Ambulatory Visit: Payer: BC Managed Care – PPO

## 2015-07-16 ENCOUNTER — Ambulatory Visit (INDEPENDENT_AMBULATORY_CARE_PROVIDER_SITE_OTHER): Payer: BC Managed Care – PPO | Admitting: Family Medicine

## 2015-07-16 VITALS — BP 174/110 | HR 96 | Temp 98.2°F | Resp 18 | Ht 70.5 in | Wt 262.0 lb

## 2015-07-16 DIAGNOSIS — Z79899 Other long term (current) drug therapy: Secondary | ICD-10-CM

## 2015-07-16 DIAGNOSIS — E669 Obesity, unspecified: Secondary | ICD-10-CM

## 2015-07-16 DIAGNOSIS — Z0000 Encounter for general adult medical examination without abnormal findings: Secondary | ICD-10-CM

## 2015-07-16 DIAGNOSIS — I10 Essential (primary) hypertension: Secondary | ICD-10-CM

## 2015-07-16 DIAGNOSIS — Z23 Encounter for immunization: Secondary | ICD-10-CM | POA: Diagnosis not present

## 2015-07-16 DIAGNOSIS — R0789 Other chest pain: Secondary | ICD-10-CM | POA: Diagnosis not present

## 2015-07-16 LAB — CBC WITH DIFFERENTIAL/PLATELET
Basophils Absolute: 0.1 10*3/uL (ref 0.0–0.1)
Basophils Relative: 1 % (ref 0–1)
Eosinophils Absolute: 0.3 10*3/uL (ref 0.0–0.7)
Eosinophils Relative: 5 % (ref 0–5)
HEMATOCRIT: 41.8 % (ref 39.0–52.0)
HEMOGLOBIN: 14.4 g/dL (ref 13.0–17.0)
LYMPHS ABS: 1.5 10*3/uL (ref 0.7–4.0)
LYMPHS PCT: 24 % (ref 12–46)
MCH: 28.8 pg (ref 26.0–34.0)
MCHC: 34.4 g/dL (ref 30.0–36.0)
MCV: 83.6 fL (ref 78.0–100.0)
MONO ABS: 0.5 10*3/uL (ref 0.1–1.0)
MPV: 10 fL (ref 8.6–12.4)
Monocytes Relative: 8 % (ref 3–12)
NEUTROS ABS: 3.9 10*3/uL (ref 1.7–7.7)
Neutrophils Relative %: 62 % (ref 43–77)
Platelets: 271 10*3/uL (ref 150–400)
RBC: 5 MIL/uL (ref 4.22–5.81)
RDW: 13.3 % (ref 11.5–15.5)
WBC: 6.3 10*3/uL (ref 4.0–10.5)

## 2015-07-16 LAB — LIPID PANEL
Cholesterol: 130 mg/dL (ref 125–200)
HDL: 51 mg/dL (ref >=40)
LDL Cholesterol: 67 mg/dL (ref <130)
Total CHOL/HDL Ratio: 2.5 Ratio (ref <=5.0)
Triglycerides: 58 mg/dL (ref <150)
VLDL: 12 mg/dL (ref <30)

## 2015-07-16 LAB — COMPLETE METABOLIC PANEL WITH GFR
ALBUMIN: 4.2 g/dL (ref 3.6–5.1)
ALK PHOS: 87 U/L (ref 40–115)
ALT: 17 U/L (ref 9–46)
AST: 20 U/L (ref 10–40)
BUN: 22 mg/dL (ref 7–25)
CALCIUM: 9.6 mg/dL (ref 8.6–10.3)
CO2: 25 mmol/L (ref 20–31)
CREATININE: 1.1 mg/dL (ref 0.60–1.35)
Chloride: 104 mmol/L (ref 98–110)
GFR, Est Non African American: 80 mL/min (ref >=60)
Glucose, Bld: 87 mg/dL (ref 70–99)
POTASSIUM: 4.4 mmol/L (ref 3.5–5.3)
Sodium: 138 mmol/L (ref 135–146)
Total Bilirubin: 0.6 mg/dL (ref 0.2–1.2)
Total Protein: 6.6 g/dL (ref 6.1–8.1)

## 2015-07-16 LAB — TSH: TSH: 2.092 u[IU]/mL (ref 0.350–4.500)

## 2015-07-16 MED ORDER — LOSARTAN POTASSIUM-HCTZ 50-12.5 MG PO TABS: 1.0000 | ORAL_TABLET | Freq: Every day | ORAL | Status: DC
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
## 2015-07-20 ENCOUNTER — Encounter: Payer: Self-pay | Admitting: Family Medicine

## 2015-07-22 ENCOUNTER — Ambulatory Visit (INDEPENDENT_AMBULATORY_CARE_PROVIDER_SITE_OTHER): Payer: BC Managed Care – PPO | Admitting: Family Medicine

## 2015-07-22 ENCOUNTER — Encounter: Payer: Self-pay | Admitting: Family Medicine

## 2015-07-22 VITALS — BP 112/78 | HR 84 | Temp 97.9°F | Resp 16 | Ht 71.0 in | Wt 266.0 lb

## 2015-07-22 DIAGNOSIS — I10 Essential (primary) hypertension: Secondary | ICD-10-CM
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
## 2015-07-30 ENCOUNTER — Ambulatory Visit: Payer: BC Managed Care – PPO | Admitting: Family Medicine

## 2015-10-28 ENCOUNTER — Ambulatory Visit: Payer: BC Managed Care – PPO | Admitting: *Deleted

## 2015-10-28 VITALS — BP 140/72

## 2015-10-28 DIAGNOSIS — I10 Essential (primary) hypertension: Secondary | ICD-10-CM
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
## 2016-02-04 ENCOUNTER — Telehealth: Payer: Self-pay | Admitting: Family Medicine

## 2016-02-04 MED ORDER — ORLISTAT 120 MG PO CAPS: 120.0000 mg | ORAL_CAPSULE | Freq: Three times a day (TID) | ORAL | Status: DC

## 2016-02-04 NOTE — Telephone Encounter (Signed)
Patient calling to speak to you regarding the med Xenical please call him at [phone]

## 2016-02-04 NOTE — Telephone Encounter (Signed)
Call returned to patient.   Requested refill on Xenical for regularity.   Prescription sent to pharmacy.   Call placed to pharmacy. Was advised PA is required.   PA submitted.   DX: E66.09.   BMI: 37.2.

## 2016-02-07 NOTE — Telephone Encounter (Signed)
Received PA determination.  ° °PA approved.  ° °Pharmacy made aware.  °

## 2016-02-11 ENCOUNTER — Telehealth: Payer: Self-pay | Admitting: Family Medicine

## 2016-02-11 NOTE — Telephone Encounter (Signed)
Pt would like a call back regarding his Xenical prescription. Please call [phone]

## 2016-02-11 NOTE — Telephone Encounter (Signed)
Spoke to pt and he states that this medication is still too expensive. He spoke to [facility] and they said we may need to do a tier exception for it and get the price lowered. Informed him to call the pharmacy and either fax [place] something or have them call us to find out what it is we need to actually do for him to try and get this covered.

## 2016-02-24 DIAGNOSIS — D239 Other benign neoplasm of skin, unspecified: Secondary | ICD-10-CM

## 2016-02-24 HISTORY — DX: Other benign neoplasm of skin, unspecified: D23.9

## 2016-07-03 ENCOUNTER — Other Ambulatory Visit: Payer: Self-pay | Admitting: Family Medicine

## 2016-07-03 DIAGNOSIS — I10 Essential (primary) hypertension: Secondary | ICD-10-CM

## 2017-01-09 ENCOUNTER — Other Ambulatory Visit: Payer: Self-pay | Admitting: Family Medicine

## 2017-01-09 ENCOUNTER — Ambulatory Visit (INDEPENDENT_AMBULATORY_CARE_PROVIDER_SITE_OTHER): Payer: BC Managed Care – PPO | Admitting: Family Medicine

## 2017-01-09 ENCOUNTER — Encounter: Payer: Self-pay | Admitting: Family Medicine

## 2017-01-09 VITALS — BP 114/82 | HR 69 | Temp 97.8°F | Resp 14 | Wt 262.0 lb

## 2017-01-09 DIAGNOSIS — N4889 Other specified disorders of penis: Secondary | ICD-10-CM | POA: Diagnosis not present

## 2017-01-09 DIAGNOSIS — Z0000 Encounter for general adult medical examination without abnormal findings: Secondary | ICD-10-CM

## 2017-01-09 DIAGNOSIS — N50819 Testicular pain, unspecified: Secondary | ICD-10-CM

## 2017-01-09 DIAGNOSIS — R3129 Other microscopic hematuria: Secondary | ICD-10-CM

## 2017-01-09 LAB — URINALYSIS, ROUTINE W REFLEX MICROSCOPIC
BILIRUBIN URINE: NEGATIVE
GLUCOSE, UA: NEGATIVE
Ketones, ur: NEGATIVE
Leukocytes, UA: NEGATIVE
Nitrite: NEGATIVE
Protein, ur: NEGATIVE
SPECIFIC GRAVITY, URINE: 1.005 (ref 1.001–1.035)
pH: 6.5 (ref 5.0–8.0)

## 2017-01-09 LAB — URINALYSIS, MICROSCOPIC ONLY
BACTERIA UA: NONE SEEN [HPF]
CASTS: NONE SEEN [LPF]
Crystals: NONE SEEN [HPF]
Squamous Epithelial / HPF: NONE SEEN [HPF] (ref <=5)
YEAST: NONE SEEN [HPF]

## 2017-01-09 NOTE — Progress Notes (Signed)
Dictation #1 [id number]  [id number]   Subjective:    Patient ID: Darren Baker, male    DOB: 07/02/1971, 46 y.o.   MRN: [id number]  HPI  2 weeks ago, the patient developed the sudden onset of moderate intensity pain in his testicles and in his penis.  Pain was unprovoked. Pain lasted for approximately an hour and resolve spontaneously. He was sitting. There is no dysuria. There is no visible hematuria. He denies any penile discharge or rash. He denies any injury. For almost 1 week, the patient was asymptomatic and then the pain occurred again begin with moderate to severe intensity in the testicles.  He is not able to specify exactly which one was for. The pain lasted for approximately 1 hour and then subsided. He had similar pain again today and this time some mild dysuria. Past Medical History:  Diagnosis Date  . DDD (degenerative disc disease), lumbar   . Hypertension   . Low back pain   . Migraine   . Obesity   . Post concussion syndrome    No past surgical history on file. Current Outpatient Prescriptions on File Prior to Visit  Medication Sig Dispense Refill  . eletriptan (RELPAX) 40 MG tablet Take 40 mg by mouth as needed for migraine. One tablet by mouth at onset of headache. May repeat in 2 hours if headache persists or recurs.    Marland Kitchen HYDROcodone-acetaminophen (NORCO/VICODIN) 5-325 MG per tablet Take 1 tablet by mouth every 6 (six) hours as needed for moderate pain.    Marland Kitchen losartan-hydrochlorothiazide (HYZAAR) 50-12.5 MG tablet TAKE 1 TABLET BY MOUTH DAILY. 90 tablet 3  . magnesium oxide (MAG-OX) 400 (241.3 MG) MG tablet TAKE 2 OR 3 TABLETS PER DAY AS NEEDED FOR SPASMS    . meloxicam (MOBIC) 7.5 MG tablet Take 1 tablet (7.5 mg total) by mouth daily. (Patient taking differently: Take 7.5-15 mg by mouth daily. ) 30 tablet 5  . orlistat (XENICAL) 120 MG capsule Take 1 capsule (120 mg total) by mouth 3 (three) times daily with meals. 270 capsule 3  . tiZANidine (ZANAFLEX) 4 MG  tablet Take 4 mg by mouth at bedtime.    . verapamil (CALAN-SR) 180 MG CR tablet Take 180 mg by mouth.     No current facility-administered medications on file prior to visit.    No Known Allergies Social History   Social History  . Marital status: Single    Spouse name: N/A  . Number of children: N/A  . Years of education: N/A   Occupational History  . Not on file.   Social History Main Topics  . Smoking status: Never Smoker  . Smokeless tobacco: Never Used  . Alcohol use Yes     Comment: Rare  . Drug use: No  . Sexual activity: Not on file     Comment: Married, works for [employer].   Other Topics Concern  . Not on file   Social History Narrative  . No narrative on file   Family History  Problem Relation Age of Onset  . Diabetes Father   . Heart disease Father   . Hyperlipidemia Father   . Hypertension Father      Review of Systems  All other systems reviewed and are negative.      Objective:   Physical Exam  Constitutional: He is oriented to person, place, and time. He appears well-developed and well-nourished. No distress.  HENT:  Head: Normocephalic and atraumatic.  Right Ear: External ear normal.  Left Ear: External ear normal.  Nose: Nose normal.  Mouth/Throat: Oropharynx is clear and moist. No oropharyngeal exudate.  Eyes: Conjunctivae and EOM are normal. Pupils are equal, round, and reactive to light. Right eye exhibits no discharge. Left eye exhibits no discharge. No scleral icterus.  Neck: Normal range of motion. Neck supple. No JVD present. No tracheal deviation present. No thyromegaly present.  Cardiovascular: Normal rate, regular rhythm, normal heart sounds and intact distal pulses.  Exam reveals no gallop and no friction rub.   No murmur heard. Pulmonary/Chest: Effort normal and breath sounds normal. No stridor. No respiratory distress. He has no wheezes. He has no rales. He exhibits no tenderness.  Abdominal: Soft. Bowel sounds are  normal. He exhibits no distension and no mass. There is no tenderness. There is no rebound and no guarding. Hernia confirmed negative in the right inguinal area and confirmed negative in the left inguinal area.  Genitourinary: Testes normal. Right testis shows no mass, no swelling and no tenderness. Left testis shows no mass, no swelling and no tenderness. Uncircumcised. No phimosis, paraphimosis, hypospadias, penile erythema or penile tenderness. No discharge found.  Musculoskeletal: Normal range of motion. He exhibits no edema.  Lymphadenopathy:    He has no cervical adenopathy.       Right: No inguinal adenopathy present.       Left: No inguinal adenopathy present.  Neurological: He is alert and oriented to person, place, and time. He has normal reflexes. No cranial nerve deficit. He exhibits normal muscle tone. Coordination normal.  Skin: Skin is warm. No rash noted. He is not diaphoretic. No erythema. No pallor.  Psychiatric: He has a normal mood and affect. His behavior is normal. Judgment and thought content normal.  Vitals reviewed.         Assessment & Plan:  Penile pain - Plan: Urinalysis, Routine w reflex microscopic  Other microscopic hematuria - Plan: CT RENAL STONE STUDY  Testicular pain, unspecified - Plan: CT RENAL STONE STUDY  Urine sample was significant for microscopic hematuria. Testicular exam is normal. History is concerning for possible kidney stone. Proceed with a CT scan renal stone protocol to evaluate for kidney stone.

## 2017-01-11 ENCOUNTER — Other Ambulatory Visit: Payer: BC Managed Care – PPO

## 2017-01-11 DIAGNOSIS — Z0000 Encounter for general adult medical examination without abnormal findings: Secondary | ICD-10-CM

## 2017-01-11 LAB — CBC WITH DIFFERENTIAL/PLATELET
BASOS ABS: 0 {cells}/uL (ref 0–200)
Basophils Relative: 0 %
EOS PCT: 3 %
Eosinophils Absolute: 342 cells/uL (ref 15–500)
HCT: 40.6 % (ref 38.5–50.0)
HEMOGLOBIN: 13.7 g/dL (ref 13.0–17.0)
LYMPHS ABS: 1938 {cells}/uL (ref 850–3900)
Lymphocytes Relative: 17 %
MCH: 29.2 pg (ref 27.0–33.0)
MCHC: 33.7 g/dL (ref 32.0–36.0)
MCV: 86.6 fL (ref 80.0–100.0)
MPV: 10.2 fL (ref 7.5–12.5)
Monocytes Absolute: 798 cells/uL (ref 200–950)
Monocytes Relative: 7 %
NEUTROS PCT: 73 %
Neutro Abs: 8322 cells/uL — ABNORMAL HIGH (ref 1500–7800)
PLATELETS: 278 10*3/uL (ref 140–400)
RBC: 4.69 MIL/uL (ref 4.20–5.80)
RDW: 13.4 % (ref 11.0–15.0)
WBC: 11.4 10*3/uL — ABNORMAL HIGH (ref 3.8–10.8)

## 2017-01-11 LAB — COMPLETE METABOLIC PANEL WITH GFR
ALBUMIN: 4.1 g/dL (ref 3.6–5.1)
ALK PHOS: 89 U/L (ref 40–115)
ALT: 17 U/L (ref 9–46)
AST: 20 U/L (ref 10–40)
BILIRUBIN TOTAL: 0.6 mg/dL (ref 0.2–1.2)
BUN: 19 mg/dL (ref 7–25)
CO2: 28 mmol/L (ref 20–31)
CREATININE: 1.18 mg/dL (ref 0.60–1.35)
Calcium: 9.5 mg/dL (ref 8.6–10.3)
Chloride: 104 mmol/L (ref 98–110)
GFR, Est African American: 84 mL/min (ref >=60)
GFR, Est Non African American: 73 mL/min (ref >=60)
GLUCOSE: 91 mg/dL (ref 70–99)
Potassium: 4.2 mmol/L (ref 3.5–5.3)
SODIUM: 138 mmol/L (ref 135–146)
TOTAL PROTEIN: 6.4 g/dL (ref 6.1–8.1)

## 2017-01-11 LAB — LIPID PANEL
Cholesterol: 133 mg/dL (ref <200)
HDL: 49 mg/dL (ref >40)
LDL CALC: 63 mg/dL (ref <100)
Total CHOL/HDL Ratio: 2.7 Ratio (ref <5.0)
Triglycerides: 107 mg/dL (ref <150)
VLDL: 21 mg/dL (ref <30)

## 2017-01-12 ENCOUNTER — Ambulatory Visit
Admission: RE | Admit: 2017-01-12 | Discharge: 2017-01-12 | Disposition: A | Discharge status: Home / Self Care | Payer: BC Managed Care – PPO | Source: Ambulatory Visit | Attending: Family Medicine | Admitting: Family Medicine

## 2017-01-12 DIAGNOSIS — R3129 Other microscopic hematuria: Secondary | ICD-10-CM

## 2017-01-12 DIAGNOSIS — N50819 Testicular pain, unspecified: Secondary | ICD-10-CM

## 2017-01-12 LAB — HEMOGLOBIN A1C
HEMOGLOBIN A1C: 5.3 % (ref <5.7)
MEAN PLASMA GLUCOSE: 105 mg/dL

## 2017-01-19 ENCOUNTER — Ambulatory Visit (INDEPENDENT_AMBULATORY_CARE_PROVIDER_SITE_OTHER): Payer: BC Managed Care – PPO | Admitting: Family Medicine

## 2017-01-19 ENCOUNTER — Encounter: Payer: Self-pay | Admitting: Family Medicine

## 2017-01-19 VITALS — BP 118/84 | HR 88 | Temp 97.6°F | Resp 18 | Ht 71.0 in | Wt 262.0 lb

## 2017-01-19 DIAGNOSIS — Z0000 Encounter for general adult medical examination without abnormal findings: Secondary | ICD-10-CM

## 2017-01-19 MED ORDER — CIPROFLOXACIN HCL 500 MG PO TABS: 500.0000 mg | ORAL_TABLET | Freq: Two times a day (BID) | ORAL | 0 refills | Status: DC

## 2017-01-19 NOTE — Progress Notes (Signed)
Dictation #1 [id number]  [id number]   Subjective:    Patient ID: Darren Baker, male    DOB: 11/23/1970, 46 y.o.   MRN: [id number]  HPI  01/09/17 2 weeks ago, the patient developed the sudden onset of moderate intensity pain in his testicles and in his penis.  Pain was unprovoked. Pain lasted for approximately an hour and resolve spontaneously. He was sitting. There is no dysuria. There is no visible hematuria. He denies any penile discharge or rash. He denies any injury. For almost 1 week, the patient was asymptomatic and then the pain occurred again begin with moderate to severe intensity in the testicles.  He is not able to specify exactly which one was for. The pain lasted for approximately 1 hour and then subsided. He had similar pain again today and this time some mild dysuria. At that time, my plan was: Urine sample was significant for microscopic hematuria. Testicular exam is normal. History is concerning for possible kidney stone. Proceed with a CT scan renal stone protocol to evaluate for kidney stone.  CT revealed: Lower chest: Lung bases clear Hepatobiliary: Gallbladder and liver normal appearance Pancreas: Normal appearance Spleen: Normal appearance Adrenals/Urinary Tract: Adrenal glands normal appearance. Tiny nonobstructing LEFT renal calculus. No hydronephrosis or hydroureter. No renal mass. Bladder and ureters unremarkable. Stomach/Bowel: Normal appendix. Descending and sigmoid diverticulosis without evidence of diverticulitis. Stomach and bowel loops otherwise normal appearance. Vascular/Lymphatic: Aorta normal caliber.  No adenopathy. Reproductive: Unremarkable prostate gland and seminal vesicles Other: No free air free fluid.  No hernia or inflammatory process. Musculoskeletal: Mild degenerative disc disease changes L4-L5 with AP narrowing of spinal canal. Subcutaneous nodule anterior pelvis 2.4 x 3.5 x 2.5 cm potentially a sebaceous cyst or  epidermal inclusion cyst, located RIGHT of midline at the level of the acetabular roofs. No acute osseous findings.  4/201/18 Here for CPE.  There was no finding on the CT scan that would explain his pain. Continues to have pain in his left greater than right testicle. He is now also reporting pain up in his rectum. He also continues to have some dysuria. Prostate exam was performed today and was significant for a slightly swollen but extremely tender prostate without nodularity patient does not require colonoscopy until age 78. Prostate exam was just performed. Most recent lab work as listed below  Appointment on 01/11/2017  Component Date Value Ref Range Status  . WBC 01/11/2017 11.4* 3.8 - 10.8 K/uL Final  . RBC 01/11/2017 4.69  4.20 - 5.80 MIL/uL Final  . Hemoglobin 01/11/2017 13.7  13.0 - 17.0 g/dL Final  . HCT 01/11/2017 40.6  38.5 - 50.0 % Final  . MCV 01/11/2017 86.6  80.0 - 100.0 fL Final  . MCH 01/11/2017 29.2  27.0 - 33.0 pg Final  . MCHC 01/11/2017 33.7  32.0 - 36.0 g/dL Final  . RDW 01/11/2017 13.4  11.0 - 15.0 % Final  . Platelets 01/11/2017 278  140 - 400 K/uL Final  . MPV 01/11/2017 10.2  7.5 - 12.5 fL Final  . Neutro Abs 01/11/2017 8322* 1,500 - 7,800 cells/uL Final  . Lymphs Abs 01/11/2017 1938  850 - 3,900 cells/uL Final  . Monocytes Absolute 01/11/2017 798  200 - 950 cells/uL Final  . Eosinophils Absolute 01/11/2017 342  15 - 500 cells/uL Final  . Basophils Absolute 01/11/2017 0  0 - 200 cells/uL Final  . Neutrophils Relative % 01/11/2017 73  % Final  . Lymphocytes Relative 01/11/2017 17  % Final  .  Monocytes Relative 01/11/2017 7  % Final  . Eosinophils Relative 01/11/2017 3  % Final  . Basophils Relative 01/11/2017 0  % Final  . Smear Review 01/11/2017 Criteria for review not met   Final  . Sodium 01/11/2017 138  135 - 146 mmol/L Final  . Potassium 01/11/2017 4.2  3.5 - 5.3 mmol/L Final  . Chloride 01/11/2017 104  98 - 110 mmol/L Final  . CO2 01/11/2017 28  20 -  31 mmol/L Final  . Glucose, Bld 01/11/2017 91  70 - 99 mg/dL Final  . BUN 01/11/2017 19  7 - 25 mg/dL Final  . Creat 01/11/2017 1.18  0.60 - 1.35 mg/dL Final  . Total Bilirubin 01/11/2017 0.6  0.2 - 1.2 mg/dL Final  . Alkaline Phosphatase 01/11/2017 89  40 - 115 U/L Final  . AST 01/11/2017 20  10 - 40 U/L Final  . ALT 01/11/2017 17  9 - 46 U/L Final  . Total Protein 01/11/2017 6.4  6.1 - 8.1 g/dL Final  . Albumin 01/11/2017 4.1  3.6 - 5.1 g/dL Final  . Calcium 01/11/2017 9.5  8.6 - 10.3 mg/dL Final  . GFR, Est African American 01/11/2017 84  >=60 mL/min Final  . GFR, Est Non African American 01/11/2017 73  >=60 mL/min Final  . Cholesterol 01/11/2017 133  <200 mg/dL Final  . Triglycerides 01/11/2017 107  <150 mg/dL Final  . HDL 01/11/2017 49  >40 mg/dL Final  . Total CHOL/HDL Ratio 01/11/2017 2.7  <5.0 Ratio Final  . VLDL 01/11/2017 21  <30 mg/dL Final  . LDL Cholesterol 01/11/2017 63  <100 mg/dL Final  . Hgb A1c MFr Bld 01/11/2017 5.3  <5.7 % Final   Comment:   For the purpose of screening for the presence of diabetes:   <5.7%       Consistent with the absence of diabetes 5.7-6.4 %   Consistent with increased risk for diabetes (prediabetes) >=6.5 %     Consistent with diabetes   This assay result is consistent with a decreased risk of diabetes.   Currently, no consensus exists regarding use of hemoglobin A1c for diagnosis of diabetes in children.   According to American Diabetes Association (ADA) guidelines, hemoglobin A1c <7.0% represents optimal control in non-pregnant diabetic patients. Different metrics may apply to specific patient populations. Standards of Medical Care in Diabetes (ADA).     . Mean Plasma Glucose 01/11/2017 105  mg/dL Final  Office Visit on 01/09/2017  Component Date Value Ref Range Status  . Color, Urine 01/09/2017 YELLOW  YELLOW Final  . APPearance 01/09/2017 CLEAR  CLEAR Final  . Specific Gravity, Urine 01/09/2017 1.005  1.001 - 1.035 Final  . pH  01/09/2017 6.5  5.0 - 8.0 Final  . Glucose, UA 01/09/2017 NEGATIVE  NEGATIVE Final  . Bilirubin Urine 01/09/2017 NEGATIVE  NEGATIVE Final  . Ketones, ur 01/09/2017 NEGATIVE  NEGATIVE Final  . Hgb urine dipstick 01/09/2017 TRACE* NEGATIVE Final  . Protein, ur 01/09/2017 NEGATIVE  NEGATIVE Final  . Nitrite 01/09/2017 NEGATIVE  NEGATIVE Final  . Leukocytes, UA 01/09/2017 NEGATIVE  NEGATIVE Final  . WBC, UA 01/09/2017 0-5  <=5 WBC/HPF Final  . RBC / HPF 01/09/2017 0-2  <=2 RBC/HPF Final  . Squamous Epithelial / LPF 01/09/2017 NONE SEEN  <=5 HPF Final  . Bacteria, UA 01/09/2017 NONE SEEN  NONE SEEN HPF Final  . Crystals 01/09/2017 NONE SEEN  NONE SEEN HPF Final  . Casts 01/09/2017 NONE SEEN  NONE SEEN LPF Final  .  Yeast 01/09/2017 NONE SEEN  NONE SEEN HPF Final     Past Medical History:  Diagnosis Date  . DDD (degenerative disc disease), lumbar   . Hypertension   . Low back pain   . Migraine   . Obesity   . Post concussion syndrome    No past surgical history on file. Current Outpatient Prescriptions on File Prior to Visit  Medication Sig Dispense Refill  . eletriptan (RELPAX) 40 MG tablet Take 40 mg by mouth as needed for migraine. One tablet by mouth at onset of headache. May repeat in 2 hours if headache persists or recurs.    Marland Kitchen HYDROcodone-acetaminophen (NORCO/VICODIN) 5-325 MG per tablet Take 1 tablet by mouth every 6 (six) hours as needed for moderate pain.    Marland Kitchen losartan-hydrochlorothiazide (HYZAAR) 50-12.5 MG tablet TAKE 1 TABLET BY MOUTH DAILY. 90 tablet 3  . magnesium oxide (MAG-OX) 400 (241.3 MG) MG tablet TAKE 2 OR 3 TABLETS PER DAY AS NEEDED FOR SPASMS    . meloxicam (MOBIC) 7.5 MG tablet Take 1 tablet (7.5 mg total) by mouth daily. (Patient taking differently: Take 7.5-15 mg by mouth daily. ) 30 tablet 5  . orlistat (XENICAL) 120 MG capsule Take 1 capsule (120 mg total) by mouth 3 (three) times daily with meals. 270 capsule 3  . verapamil (CALAN-SR) 180 MG CR tablet Take  180 mg by mouth.    Marland Kitchen tiZANidine (ZANAFLEX) 4 MG tablet Take 4 mg by mouth at bedtime.     No current facility-administered medications on file prior to visit.    No Known Allergies Social History   Social History  . Marital status: Single    Spouse name: N/A  . Number of children: N/A  . Years of education: N/A   Occupational History  . Not on file.   Social History Main Topics  . Smoking status: Never Smoker  . Smokeless tobacco: Never Used  . Alcohol use Yes     Comment: Rare  . Drug use: No  . Sexual activity: Not on file     Comment: Married, works for [employer].   Other Topics Concern  . Not on file   Social History Narrative  . No narrative on file   Family History  Problem Relation Age of Onset  . Diabetes Father   . Heart disease Father   . Hyperlipidemia Father   . Hypertension Father      Review of Systems  All other systems reviewed and are negative.      Objective:   Physical Exam  Constitutional: He is oriented to person, place, and time. He appears well-developed and well-nourished. No distress.  HENT:  Head: Normocephalic and atraumatic.  Right Ear: External ear normal.  Left Ear: External ear normal.  Nose: Nose normal.  Mouth/Throat: Oropharynx is clear and moist. No oropharyngeal exudate.  Eyes: Conjunctivae and EOM are normal. Pupils are equal, round, and reactive to light. Right eye exhibits no discharge. Left eye exhibits no discharge. No scleral icterus.  Neck: Normal range of motion. Neck supple. No JVD present. No tracheal deviation present. No thyromegaly present.  Cardiovascular: Normal rate, regular rhythm, normal heart sounds and intact distal pulses.  Exam reveals no gallop and no friction rub.   No murmur heard. Pulmonary/Chest: Effort normal and breath sounds normal. No stridor. No respiratory distress. He has no wheezes. He has no rales. He exhibits no tenderness.  Abdominal: Soft. Bowel sounds are normal. He exhibits  no distension and no  mass. There is no tenderness. There is no rebound and no guarding. Hernia confirmed negative in the right inguinal area and confirmed negative in the left inguinal area.  Genitourinary: Testes normal. Prostate is enlarged and tender. Right testis shows no mass, no swelling and no tenderness. Left testis shows no mass, no swelling and no tenderness. Uncircumcised. No phimosis, paraphimosis, hypospadias, penile erythema or penile tenderness. No discharge found.  Musculoskeletal: Normal range of motion. He exhibits no edema.  Lymphadenopathy:    He has no cervical adenopathy.       Right: No inguinal adenopathy present.       Left: No inguinal adenopathy present.  Neurological: He is alert and oriented to person, place, and time. He has normal reflexes. No cranial nerve deficit. He exhibits normal muscle tone. Coordination normal.  Skin: Skin is warm. No rash noted. He is not diaphoretic. No erythema. No pallor.  Psychiatric: He has a normal mood and affect. His behavior is normal. Judgment and thought content normal.  Vitals reviewed.         Assessment & Plan:  General medical exam  I believe that the patient has prostatitis. This will explain the enlarged tender prostate, dysuria, the rectal pain, and the referral of the pain into his scrotal area. Begin Cipro 500 mg by mouth twice a day for 10 days. Drink plenty of fluids. Recheck if no better. CBC, CMP, fasting lipid panel, hemoglobin A1c were excellent. Cancer screening is not yet due. Blood pressure today is well controlled. I did recommend 30 minutes a day of aerobic exercise 5 days a week and a 1500-calorie a day diet to help reduce weight.

## 2017-01-26 ENCOUNTER — Other Ambulatory Visit: Payer: Self-pay | Admitting: Family Medicine

## 2017-01-26 MED ORDER — CIPROFLOXACIN HCL 500 MG PO TABS: 500.0000 mg | ORAL_TABLET | Freq: Two times a day (BID) | ORAL | 0 refills | Status: DC

## 2017-02-02 ENCOUNTER — Ambulatory Visit (INDEPENDENT_AMBULATORY_CARE_PROVIDER_SITE_OTHER): Payer: BC Managed Care – PPO | Admitting: Family Medicine

## 2017-02-02 ENCOUNTER — Encounter: Payer: Self-pay | Admitting: Family Medicine

## 2017-02-02 VITALS — BP 110/76 | HR 74 | Temp 97.8°F | Resp 18 | Ht 71.0 in | Wt 263.0 lb

## 2017-02-02 DIAGNOSIS — N4889 Other specified disorders of penis: Secondary | ICD-10-CM

## 2017-02-02 DIAGNOSIS — R300 Dysuria: Secondary | ICD-10-CM

## 2017-02-02 DIAGNOSIS — N50819 Testicular pain, unspecified: Secondary | ICD-10-CM

## 2017-02-02 LAB — URINALYSIS, ROUTINE W REFLEX MICROSCOPIC
BILIRUBIN URINE: NEGATIVE
GLUCOSE, UA: NEGATIVE
Hgb urine dipstick: NEGATIVE
KETONES UR: NEGATIVE
Leukocytes, UA: NEGATIVE
Nitrite: NEGATIVE
PROTEIN: NEGATIVE
Specific Gravity, Urine: 1.005 (ref 1.001–1.035)
pH: 6 (ref 5.0–8.0)

## 2017-02-05 LAB — PSA

## 2017-02-05 NOTE — Progress Notes (Signed)
Dictation #1 [id number]  [id number]   Subjective:    Patient ID: Darren Baker, male    DOB: May 18, 1971, 46 y.o.   MRN: [id number]  HPI  01/09/17 2 weeks ago, the patient developed the sudden onset of moderate intensity pain in his testicles and in his penis.  Pain was unprovoked. Pain lasted for approximately an hour and resolve spontaneously. He was sitting. There is no dysuria. There is no visible hematuria. He denies any penile discharge or rash. He denies any injury. For almost 1 week, the patient was asymptomatic and then the pain occurred again begin with moderate to severe intensity in the testicles.  He is not able to specify exactly which one was for. The pain lasted for approximately 1 hour and then subsided. He had similar pain again today and this time some mild dysuria. At that time, my plan was: Urine sample was significant for microscopic hematuria. Testicular exam is normal. History is concerning for possible kidney stone. Proceed with a CT scan renal stone protocol to evaluate for kidney stone.  CT revealed: Lower chest: Lung bases clear Hepatobiliary: Gallbladder and liver normal appearance Pancreas: Normal appearance Spleen: Normal appearance Adrenals/Urinary Tract: Adrenal glands normal appearance. Tiny nonobstructing LEFT renal calculus. No hydronephrosis or hydroureter. No renal mass. Bladder and ureters unremarkable. Stomach/Bowel: Normal appendix. Descending and sigmoid diverticulosis without evidence of diverticulitis. Stomach and bowel loops otherwise normal appearance. Vascular/Lymphatic: Aorta normal caliber.  No adenopathy. Reproductive: Unremarkable prostate gland and seminal vesicles Other: No free air free fluid.  No hernia or inflammatory process. Musculoskeletal: Mild degenerative disc disease changes L4-L5 with AP narrowing of spinal canal. Subcutaneous nodule anterior pelvis 2.4 x 3.5 x 2.5 cm potentially a sebaceous cyst or  epidermal inclusion cyst, located RIGHT of midline at the level of the acetabular roofs. No acute osseous findings.  01/19/17 Here for CPE.  There was no finding on the CT scan that would explain his pain. Continues to have pain in his left greater than right testicle. He is now also reporting pain up in his rectum. He also continues to have some dysuria. Prostate exam was performed today and was significant for a slightly swollen but extremely tender prostate without nodularity patient does not require colonoscopy until age 61. Prostate exam was just performed.  At that time, my plan was: I believe that the patient has prostatitis. This will explain the enlarged tender prostate, dysuria, the rectal pain, and the referral of the pain into his scrotal area. Begin Cipro 500 mg by mouth twice a day for 10 days. Drink plenty of fluids. Recheck if no better. CBC, CMP, fasting lipid panel, hemoglobin A1c were excellent. Cancer screening is not yet due. Blood pressure today is well controlled. I did recommend 30 minutes a day of aerobic exercise 5 days a week and a 1500-calorie a day diet to help reduce weight.  02/05/17 Originally thought that Cipro improved symptoms. However he now believes that the antibiotics had no effect. He continues to have intermittent pain. It is difficult to describe. At times it is at the base of his penis. It tends to intensify after he drinks liquids. If he goes without drinking liquids for several hours the pain will go away. He no longer has any dysuria. He denies any gross hematuria. Urinalysis today is completely normal. He denies any pyuria. He describes an aching pain deep within his body located directly at the base of his penis. He no longer has any testicular pain. He denies  any rectal pain. He states that the pain may intensify after ejaculation although is difficult for him to truly tell.    Past Medical History:  Diagnosis Date  . DDD (degenerative disc disease),  lumbar   . Hypertension   . Low back pain   . Migraine   . Obesity   . Post concussion syndrome    No past surgical history on file. Current Outpatient Prescriptions on File Prior to Visit  Medication Sig Dispense Refill  . ciprofloxacin (CIPRO) 500 MG tablet Take 1 tablet (500 mg total) by mouth 2 (two) times daily. 8 tablet 0  . eletriptan (RELPAX) 40 MG tablet Take 40 mg by mouth as needed for migraine. One tablet by mouth at onset of headache. May repeat in 2 hours if headache persists or recurs.    Marland Kitchen HYDROcodone-acetaminophen (NORCO/VICODIN) 5-325 MG per tablet Take 1 tablet by mouth every 6 (six) hours as needed for moderate pain.    Marland Kitchen ketorolac (TORADOL) 10 MG tablet Take 10 mg by mouth 4 (four) times daily.    Marland Kitchen losartan-hydrochlorothiazide (HYZAAR) 50-12.5 MG tablet TAKE 1 TABLET BY MOUTH DAILY. 90 tablet 3  . magnesium oxide (MAG-OX) 400 (241.3 MG) MG tablet TAKE 2 OR 3 TABLETS PER DAY AS NEEDED FOR SPASMS    . meloxicam (MOBIC) 7.5 MG tablet Take 1 tablet (7.5 mg total) by mouth daily. (Patient taking differently: Take 7.5-15 mg by mouth daily. ) 30 tablet 5  . methocarbamol (ROBAXIN) 750 MG tablet TAKE 1 TABLET BY MOUTH FOUR TIMES A DAY AS NEEDED  1  . orlistat (XENICAL) 120 MG capsule Take 1 capsule (120 mg total) by mouth 3 (three) times daily with meals. (Patient not taking: Reported on 02/02/2017) 270 capsule 3  . tiZANidine (ZANAFLEX) 4 MG tablet Take 4 mg by mouth at bedtime.    . verapamil (CALAN-SR) 180 MG CR tablet Take 180 mg by mouth.     No current facility-administered medications on file prior to visit.    No Known Allergies Social History   Social History  . Marital status: Single    Spouse name: N/A  . Number of children: N/A  . Years of education: N/A   Occupational History  . Not on file.   Social History Main Topics  . Smoking status: Never Smoker  . Smokeless tobacco: Never Used  . Alcohol use Yes     Comment: Rare  . Drug use: No  . Sexual  activity: Not on file     Comment: Married, works for [employer].   Other Topics Concern  . Not on file   Social History Narrative  . No narrative on file   Family History  Problem Relation Age of Onset  . Diabetes Father   . Heart disease Father   . Hyperlipidemia Father   . Hypertension Father      Review of Systems  All other systems reviewed and are negative.      Objective:   Physical Exam  Constitutional: He appears well-developed and well-nourished. No distress.  Cardiovascular: Normal rate, regular rhythm and normal heart sounds.   No murmur heard. Pulmonary/Chest: Effort normal and breath sounds normal. No respiratory distress. He has no wheezes. He has no rales.  Abdominal: Soft. Bowel sounds are normal. He exhibits no distension and no mass. There is no tenderness. There is no rebound and no guarding. Hernia confirmed negative in the right inguinal area and confirmed negative in the left inguinal area.  Genitourinary: Testes normal and penis normal. Right testis shows no mass, no swelling and no tenderness. Left testis shows no mass, no swelling and no tenderness. Uncircumcised. No phimosis, paraphimosis, hypospadias, penile erythema or penile tenderness. No discharge found.  Musculoskeletal: Normal range of motion.  Lymphadenopathy:       Right: No inguinal adenopathy present.       Left: No inguinal adenopathy present.  Skin: Skin is warm. No rash noted. He is not diaphoretic. No erythema.  Vitals reviewed.         Assessment & Plan:  Penile pain - Plan: PSA, Urinalysis, Routine w reflex microscopic, Ambulatory referral to Urology  Testicular pain, unspecified - Plan: Ambulatory referral to Urology  Dysuria - Plan: Ambulatory referral to Urology At this point, I am not sure why the patient is having his persistent pelvic pain. Differential diagnosis includes chronic prostatitis/urethritis which is noninfectious versus some type of interstitial  cystitis. I will consult urology. Repeat urinalysis is normal. CT scan has revealed no cause of his pain. Exam today is normal. I have recommended the patient discontinue all caffeine. If this is interstitial cystitis, it may be triggered by caffeine and stress and I explained that to the patient. I will defer future management to urology as I do not feel comfortable continuing to treat chronic prostatitis with antibiotics without urology consultation.

## 2017-02-08 ENCOUNTER — Encounter: Payer: Self-pay | Admitting: Family Medicine

## 2017-02-08 ENCOUNTER — Ambulatory Visit (INDEPENDENT_AMBULATORY_CARE_PROVIDER_SITE_OTHER): Payer: BC Managed Care – PPO | Admitting: Family Medicine

## 2017-02-08 VITALS — BP 100/64 | HR 76 | Temp 97.6°F | Resp 18 | Ht 71.0 in | Wt 258.0 lb

## 2017-02-08 DIAGNOSIS — R42 Dizziness and giddiness: Secondary | ICD-10-CM | POA: Diagnosis not present

## 2017-02-08 DIAGNOSIS — R300 Dysuria: Secondary | ICD-10-CM | POA: Diagnosis not present

## 2017-02-08 LAB — CBC WITH DIFFERENTIAL/PLATELET
BASOS PCT: 0 %
Basophils Absolute: 0 cells/uL (ref 0–200)
EOS ABS: 238 {cells}/uL (ref 15–500)
Eosinophils Relative: 2 %
HCT: 39.8 % (ref 38.5–50.0)
Hemoglobin: 13.8 g/dL (ref 13.0–17.0)
LYMPHS ABS: 1785 {cells}/uL (ref 850–3900)
Lymphocytes Relative: 15 %
MCH: 29.5 pg (ref 27.0–33.0)
MCHC: 34.7 g/dL (ref 32.0–36.0)
MCV: 85 fL (ref 80.0–100.0)
MONO ABS: 952 {cells}/uL — AB (ref 200–950)
MPV: 10.3 fL (ref 7.5–12.5)
Monocytes Relative: 8 %
NEUTROS ABS: 8925 {cells}/uL — AB (ref 1500–7800)
Neutrophils Relative %: 75 %
Platelets: 290 10*3/uL (ref 140–400)
RBC: 4.68 MIL/uL (ref 4.20–5.80)
RDW: 13.6 % (ref 11.0–15.0)
WBC: 11.9 10*3/uL — ABNORMAL HIGH (ref 3.8–10.8)

## 2017-02-08 LAB — PSA: PSA: 0.7 ng/mL (ref <=4.0)

## 2017-02-08 NOTE — Progress Notes (Signed)
Dictation #1 [id number]  [id number]   Subjective:    Patient ID: Darren Baker, male    DOB: 1971-05-14, 46 y.o.   MRN: [id number]  HPI  01/09/17 2 weeks ago, the patient developed the sudden onset of moderate intensity pain in his testicles and in his penis.  Pain was unprovoked. Pain lasted for approximately an hour and resolve spontaneously. He was sitting. There is no dysuria. There is no visible hematuria. He denies any penile discharge or rash. He denies any injury. For almost 1 week, the patient was asymptomatic and then the pain occurred again begin with moderate to severe intensity in the testicles.  He is not able to specify exactly which one was for. The pain lasted for approximately 1 hour and then subsided. He had similar pain again today and this time some mild dysuria. At that time, my plan was: Urine sample was significant for microscopic hematuria. Testicular exam is normal. History is concerning for possible kidney stone. Proceed with a CT scan renal stone protocol to evaluate for kidney stone.  CT revealed: Lower chest: Lung bases clear Hepatobiliary: Gallbladder and liver normal appearance Pancreas: Normal appearance Spleen: Normal appearance Adrenals/Urinary Tract: Adrenal glands normal appearance. Tiny nonobstructing LEFT renal calculus. No hydronephrosis or hydroureter. No renal mass. Bladder and ureters unremarkable. Stomach/Bowel: Normal appendix. Descending and sigmoid diverticulosis without evidence of diverticulitis. Stomach and bowel loops otherwise normal appearance. Vascular/Lymphatic: Aorta normal caliber.  No adenopathy. Reproductive: Unremarkable prostate gland and seminal vesicles Other: No free air free fluid.  No hernia or inflammatory process. Musculoskeletal: Mild degenerative disc disease changes L4-L5 with AP narrowing of spinal canal. Subcutaneous nodule anterior pelvis 2.4 x 3.5 x 2.5 cm potentially a sebaceous cyst or  epidermal inclusion cyst, located RIGHT of midline at the level of the acetabular roofs. No acute osseous findings.  01/19/17 Here for CPE.  There was no finding on the CT scan that would explain his pain. Continues to have pain in his left greater than right testicle. He is now also reporting pain up in his rectum. He also continues to have some dysuria. Prostate exam was performed today and was significant for a slightly swollen but extremely tender prostate without nodularity patient does not require colonoscopy until age 54. Prostate exam was just performed.  At that time, my plan was: I believe that the patient has prostatitis. This will explain the enlarged tender prostate, dysuria, the rectal pain, and the referral of the pain into his scrotal area. Begin Cipro 500 mg by mouth twice a day for 10 days. Drink plenty of fluids. Recheck if no better. CBC, CMP, fasting lipid panel, hemoglobin A1c were excellent. Cancer screening is not yet due. Blood pressure today is well controlled. I did recommend 30 minutes a day of aerobic exercise 5 days a week and a 1500-calorie a day diet to help reduce weight.  02/05/17 Originally thought that Cipro improved symptoms. However he now believes that the antibiotics had no effect. He continues to have intermittent pain. It is difficult to describe. At times it is at the base of his penis. It tends to intensify after he drinks liquids. If he goes without drinking liquids for several hours the pain will go away. He no longer has any dysuria. He denies any gross hematuria. Urinalysis today is completely normal. He denies any pyuria. He describes an aching pain deep within his body located directly at the base of his penis. He no longer has any testicular pain. He denies  any rectal pain. He states that the pain may intensify after ejaculation although is difficult for him to truly tell.  AT that time, my plan was: At this point, I am not sure why the patient is having his  persistent pelvic pain. Differential diagnosis includes chronic prostatitis/urethritis which is noninfectious versus some type of interstitial cystitis. I will consult urology. Repeat urinalysis is normal. CT scan has revealed no cause of his pain. Exam today is normal. I have recommended the patient discontinue all caffeine. If this is interstitial cystitis, it may be triggered by caffeine and stress and I explained that to the patient. I will defer future management to urology as I do not feel comfortable continuing to treat chronic prostatitis with antibiotics without urology consultation.  02/08/17 Patient does think that his pain is improving after discontinuing caffeine although he is having a caffeine withdrawal headache. He is here today to recheck his PSA and he would also like to recheck a CBC to make sure his white count has gone back to normal. There was a laboratory error with a PSA originally forcing [place] to redraw it. However over the weekend, the patient developed dizziness. It was worse yesterday. A she reports that whenever he lies down or sits up quickly, the room will feel like it's moving. He has a positive Dix-Hallpike maneuver to the right. He denies any hearing loss. He denies any vision changes. He denies any other neurologic deficit    Past Medical History:  Diagnosis Date  . DDD (degenerative disc disease), lumbar   . Hypertension   . Low back pain   . Migraine   . Obesity   . Post concussion syndrome    No past surgical history on file. Current Outpatient Prescriptions on File Prior to Visit  Medication Sig Dispense Refill  . eletriptan (RELPAX) 40 MG tablet Take 40 mg by mouth as needed for migraine. One tablet by mouth at onset of headache. May repeat in 2 hours if headache persists or recurs.    Marland Kitchen HYDROcodone-acetaminophen (NORCO/VICODIN) 5-325 MG per tablet Take 1 tablet by mouth every 6 (six) hours as needed for moderate pain.    Marland Kitchen ketorolac (TORADOL) 10 MG tablet  Take 10 mg by mouth 4 (four) times daily.    Marland Kitchen losartan-hydrochlorothiazide (HYZAAR) 50-12.5 MG tablet TAKE 1 TABLET BY MOUTH DAILY. 90 tablet 3  . magnesium oxide (MAG-OX) 400 (241.3 MG) MG tablet TAKE 2 OR 3 TABLETS PER DAY AS NEEDED FOR SPASMS    . meloxicam (MOBIC) 7.5 MG tablet Take 1 tablet (7.5 mg total) by mouth daily. (Patient taking differently: Take 7.5-15 mg by mouth daily. ) 30 tablet 5  . methocarbamol (ROBAXIN) 750 MG tablet TAKE 1 TABLET BY MOUTH FOUR TIMES A DAY AS NEEDED  1  . orlistat (XENICAL) 120 MG capsule Take 1 capsule (120 mg total) by mouth 3 (three) times daily with meals. 270 capsule 3  . tiZANidine (ZANAFLEX) 4 MG tablet Take 4 mg by mouth at bedtime.    . verapamil (CALAN-SR) 180 MG CR tablet Take 180 mg by mouth.     No current facility-administered medications on file prior to visit.    No Known Allergies Social History   Social History  . Marital status: Single    Spouse name: N/A  . Number of children: N/A  . Years of education: N/A   Occupational History  . Not on file.   Social History Main Topics  . Smoking status: Never Smoker  .  Smokeless tobacco: Never Used  . Alcohol use Yes     Comment: Rare  . Drug use: No  . Sexual activity: Not on file     Comment: Married, works for [employer].   Other Topics Concern  . Not on file   Social History Narrative  . No narrative on file   Family History  Problem Relation Age of Onset  . Diabetes Father   . Heart disease Father   . Hyperlipidemia Father   . Hypertension Father      Review of Systems  All other systems reviewed and are negative.      Objective:   Physical Exam  Constitutional: He appears well-developed and well-nourished. No distress.  Cardiovascular: Normal rate, regular rhythm and normal heart sounds.   No murmur heard. Pulmonary/Chest: Effort normal and breath sounds normal. No respiratory distress. He has no wheezes. He has no rales.   Vitals  reviewed.  Positive Dix-Hallpike maneuver to the right. Tympanic membranes are pearly gray without middle ear effusion bilaterally. Cranial nerves II through XII are grossly intact muscle strength 5 over 5 equal and symmetric in the upper and lower extremities. He has a normal cerebellar exam       Assessment & Plan:  Dysuria - Plan: PSA, CBC with Differential/Platelet  Vertigo  I believe the patient's having vertigo. I have recommended over-the-counter meclizine and tincture of time. I believe that his dysuria is secondary to either prostatitis versus painful bladder syndrome from caffeine abuse. I have recommended abstinence from caffeine and spicy foods. He will follow with urology as planned.

## 2017-02-14 ENCOUNTER — Telehealth: Payer: Self-pay | Admitting: Family Medicine

## 2017-02-14 NOTE — Telephone Encounter (Signed)
Pt is having a lot gong on in his life and he is working on a big case and having trouble focusing and would like to know if you have any suggestions on something he could take to help calm him down so he can concentrate????? Pt is willing to come in for appointment or you can call patient personally (per pt).

## 2017-02-15 ENCOUNTER — Encounter: Payer: Self-pay | Admitting: Family Medicine

## 2017-02-15 ENCOUNTER — Ambulatory Visit (INDEPENDENT_AMBULATORY_CARE_PROVIDER_SITE_OTHER): Payer: BC Managed Care – PPO | Admitting: Family Medicine

## 2017-02-15 VITALS — BP 110/62 | HR 76 | Temp 97.8°F | Resp 18 | Ht 71.0 in | Wt 258.0 lb

## 2017-02-15 DIAGNOSIS — F411 Generalized anxiety disorder: Secondary | ICD-10-CM | POA: Diagnosis not present

## 2017-02-15 DIAGNOSIS — F429 Obsessive-compulsive disorder, unspecified: Secondary | ICD-10-CM | POA: Diagnosis not present

## 2017-02-15 MED ORDER — ALPRAZOLAM 0.5 MG PO TABS: 0.5000 mg | ORAL_TABLET | Freq: Three times a day (TID) | ORAL | 0 refills | Status: DC | PRN

## 2017-02-15 NOTE — Telephone Encounter (Signed)
Patient returned call and made aware.   Appointment scheduled for 02/15/2017 @ 3:30pm.

## 2017-02-15 NOTE — Telephone Encounter (Signed)
LMOVM to schedule appt.

## 2017-02-15 NOTE — Progress Notes (Signed)
Subjective:    Patient ID: Darren Baker, male    DOB: September 28, 1971, 46 y.o.   MRN: [id number]  HPI Patient works in [employer]. 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 He has no experience in this area. He's been on this case in September. He feels unprepared and this causes a tremendous amount of anxiety for him. Furthermore he is constantly anxious. He denies any panic attacks. He denies any depression. He does occasionally report trouble sleeping due to anxiety. He states that from an early age, he has demonstrated obsessive-compulsive characteristics. For instance, he will frequently check the locks on the dorsal 4 times before he would leave the home. He acknowledges that this is unnecessary but he did not do these things, he would feel extremely anxious. He also states that "is silly as it sounds" he would feel like something bad would happen to him if he did not follow through on his routine.  He would perform similar habits as far as checking the iron, checking the stove, checking his file cabinets, checking his sink. He denies any depression or anhedonia. He denies any delusions or hallucinations. He denies any paranoia. He does report inability to focus. I believe his trouble focusing sitting is because he is so anxious on a daily basis. He tends to avoid the difficult case and focus on easy past that he can accomplish quickly. However this gives him further behind and increases his deadline causing even more anxiety. He does have somewhat pressured speech. He tends to jump from subject to subject while talking. Other than that, he demonstrates no history of bipolar tendencies. He is not overly animated are hyperactive. He denies getting in trouble for hyperactivity in school. He denies problems with focusing in school although some of his symptoms sound consistent with ADD Past Medical History:  Diagnosis Date  . DDD (degenerative disc disease), lumbar   .  Hypertension   . Low back pain   . Migraine   . Obesity   . Post concussion syndrome    No past surgical history on file. Current Outpatient Prescriptions on File Prior to Visit  Medication Sig Dispense Refill  . eletriptan (RELPAX) 40 MG tablet Take 40 mg by mouth as needed for migraine. One tablet by mouth at onset of headache. May repeat in 2 hours if headache persists or recurs.    Marland Kitchen HYDROcodone-acetaminophen (NORCO/VICODIN) 5-325 MG per tablet Take 1 tablet by mouth every 6 (six) hours as needed for moderate pain.    Marland Kitchen ketorolac (TORADOL) 10 MG tablet Take 10 mg by mouth 4 (four) times daily.    Marland Kitchen losartan-hydrochlorothiazide (HYZAAR) 50-12.5 MG tablet TAKE 1 TABLET BY MOUTH DAILY. 90 tablet 3  . magnesium oxide (MAG-OX) 400 (241.3 MG) MG tablet TAKE 2 OR 3 TABLETS PER DAY AS NEEDED FOR SPASMS    . meloxicam (MOBIC) 7.5 MG tablet Take 1 tablet (7.5 mg total) by mouth daily. (Patient taking differently: Take 7.5-15 mg by mouth daily. ) 30 tablet 5  . methocarbamol (ROBAXIN) 750 MG tablet TAKE 1 TABLET BY MOUTH FOUR TIMES A DAY AS NEEDED  1  . orlistat (XENICAL) 120 MG capsule Take 1 capsule (120 mg total) by mouth 3 (three) times daily with meals. 270 capsule 3  . tiZANidine (ZANAFLEX) 4 MG tablet Take 4 mg by mouth at bedtime.    . verapamil (CALAN-SR) 180 MG CR tablet Take 180 mg by mouth.  No current facility-administered medications on file prior to visit.    No Known Allergies Social History   Social History  . Marital status: Single    Spouse name: N/A  . Number of children: N/A  . Years of education: N/A   Occupational History  . Not on file.   Social History Main Topics  . Smoking status: Never Smoker  . Smokeless tobacco: Never Used  . Alcohol use Yes     Comment: Rare  . Drug use: No  . Sexual activity: Not on file     Comment: Married, works for [employer].   Other Topics Concern  . Not on file   Social History Narrative  . No narrative on file       Review of Systems  All other systems reviewed and are negative.      Objective:   Physical Exam  Constitutional: He appears well-developed and well-nourished.  Cardiovascular: Normal rate, regular rhythm and normal heart sounds.   Pulmonary/Chest: Effort normal and breath sounds normal. No respiratory distress. He has no wheezes. He has no rales. He exhibits no tenderness.  Psychiatric: He has a normal mood and affect. His behavior is normal. Judgment and thought content normal.  Vitals reviewed.         Assessment & Plan:  GAD (generalized anxiety disorder) - Plan: ALPRAZolam (XANAX) 0.5 MG tablet  Obsessive-compulsive disorder, unspecified type  Some of his symptoms are consistent with ADD particularly his trouble focusing and avoiding tasks that require sustained effort. However most of his symptoms are consistent with obsessive-compulsive disorder. I believe that the patient has underlying anxiety disorder and obsessive-compulsive disorder. I believe that his recent struggle with his assigned criminal case has made him feel more uncomfortable and unprepared exacerbating his OCD and his anxiety. I really believe that the patient would benefit from a medication such as Lexapro to address OCD and anxiety. However he needs something more immediate to help calm him down that he can focus as this case comes to a close within the next 3-4 weeks. Therefore we will try Xanax on an as-needed basis temporarily over the next 3-4 weeks to see if this can help calm him down to focus. Moving forward in the future, if he does see benefit from Xanax, I would consider starting him on Lexapro. If the medications are ineffective, I will keep ADD in the back of my mind.

## 2017-02-15 NOTE — Telephone Encounter (Signed)
Can he come by to discuss ssri vs prn benzo

## 2017-04-23 ENCOUNTER — Encounter: Payer: Self-pay | Admitting: Family Medicine

## 2017-04-23 ENCOUNTER — Ambulatory Visit (INDEPENDENT_AMBULATORY_CARE_PROVIDER_SITE_OTHER): Payer: BC Managed Care – PPO | Admitting: Family Medicine

## 2017-04-23 VITALS — BP 130/90 | HR 84 | Temp 98.1°F | Resp 18 | Ht 71.0 in | Wt 268.0 lb

## 2017-04-23 DIAGNOSIS — L723 Sebaceous cyst: Secondary | ICD-10-CM | POA: Diagnosis not present

## 2017-04-23 MED ORDER — ESCITALOPRAM OXALATE 10 MG PO TABS: 10.0000 mg | ORAL_TABLET | Freq: Every day | ORAL | 5 refills | Status: DC

## 2017-04-23 NOTE — Progress Notes (Signed)
Subjective:    Patient ID: Darren Baker, male    DOB: 1971-04-16, 46 y.o.   MRN: [id number]  HPI  02/15/17 Patient works in [employer].  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 He has no experience in this area. He's been on this case in September. He feels unprepared and this causes a tremendous amount of anxiety for him. Furthermore he is constantly anxious. He denies any panic attacks. He denies any depression. He does occasionally report trouble sleeping due to anxiety. He states that from an early age, he has demonstrated obsessive-compulsive characteristics. For instance, he will frequently check the locks on the dorsal 4 times before he would leave the home. He acknowledges that this is unnecessary but he did not do these things, he would feel extremely anxious. He also states that "is silly as it sounds" he would feel like something bad would happen to him if he did not follow through on his routine.  He would perform similar habits as far as checking the iron, checking the stove, checking his file cabinets, checking his sink. He denies any depression or anhedonia. He denies any delusions or hallucinations. He denies any paranoia. He does report inability to focus. I believe his trouble focusing sitting is because he is so anxious on a daily basis. He tends to avoid the difficult case and focus on easy past that he can accomplish quickly. However this gives him further behind and increases his deadline causing even more anxiety. He does have somewhat pressured speech. He tends to jump from subject to subject while talking. Other than that, he demonstrates no history of bipolar tendencies. He is not overly animated are hyperactive. He denies getting in trouble for hyperactivity in school. He denies problems with focusing in school although some of his symptoms sound consistent with ADD.  At that time, my plan was: Some of his symptoms are consistent with ADD  particularly his trouble focusing and avoiding tasks that require sustained effort. However most of his symptoms are consistent with obsessive-compulsive disorder. I believe that the patient has underlying anxiety disorder and obsessive-compulsive disorder. I believe that his recent struggle with his assigned criminal case has made him feel more uncomfortable and unprepared exacerbating his OCD and his anxiety. I really believe that the patient would benefit from a medication such as Lexapro to address OCD and anxiety. However he needs something more immediate to help calm him down that he can focus as this case comes to a close within the next 3-4 weeks. Therefore we will try Xanax on an as-needed basis temporarily over the next 3-4 weeks to see if this can help calm him down to focus. Moving forward in the future, if he does see benefit from Xanax, I would consider starting him on Lexapro. If the medications are ineffective, I will keep ADD in the back of my mind.  04/23/17  Patient has had a subcutaneous mass below his umbilicus at the crease in his abdomen for quite some time. Recently it has become more swollen. It is now draining foul-smelling material to a poor in the skin. It is mildly tender to the touch. The skin around it is indurated. Symptoms are consistent with an inflamed sebaceous cyst. He is requesting referral to a surgeon for excision. In the past he underwent incision and drainage and the cyst has returned and therefore he would like it removed in its entirety. Given its location, I believe this to  be better, was by general surgeon. He also saw only minimal benefit from the Xanax. However he was taking a half a tablet. If he took a whole tablet, this would help calm him down and help him sleep. He continues to suffer from anxiety. He is interested in trying Lexapro to help manage the anxiety Past Medical History:  Diagnosis Date  . DDD (degenerative disc disease), lumbar   . Hypertension     . Low back pain   . Migraine   . Obesity   . Post concussion syndrome    No past surgical history on file. Current Outpatient Prescriptions on File Prior to Visit  Medication Sig Dispense Refill  . ALPRAZolam (XANAX) 0.5 MG tablet Take 1 tablet (0.5 mg total) by mouth 3 (three) times daily as needed for anxiety. 30 tablet 0  . eletriptan (RELPAX) 40 MG tablet Take 40 mg by mouth as needed for migraine. One tablet by mouth at onset of headache. May repeat in 2 hours if headache persists or recurs.    Marland Kitchen HYDROcodone-acetaminophen (NORCO/VICODIN) 5-325 MG per tablet Take 1 tablet by mouth every 6 (six) hours as needed for moderate pain.    Marland Kitchen losartan-hydrochlorothiazide (HYZAAR) 50-12.5 MG tablet TAKE 1 TABLET BY MOUTH DAILY. 90 tablet 3  . magnesium oxide (MAG-OX) 400 (241.3 MG) MG tablet TAKE 2 OR 3 TABLETS PER DAY AS NEEDED FOR SPASMS    . meloxicam (MOBIC) 7.5 MG tablet Take 1 tablet (7.5 mg total) by mouth daily. (Patient taking differently: Take 7.5-15 mg by mouth daily. ) 30 tablet 5  . methocarbamol (ROBAXIN) 750 MG tablet TAKE 1 TABLET BY MOUTH FOUR TIMES A DAY AS NEEDED  1  . ketorolac (TORADOL) 10 MG tablet Take 10 mg by mouth 4 (four) times daily.    Marland Kitchen orlistat (XENICAL) 120 MG capsule Take 1 capsule (120 mg total) by mouth 3 (three) times daily with meals. (Patient not taking: Reported on 04/23/2017) 270 capsule 3  . tiZANidine (ZANAFLEX) 4 MG tablet Take 4 mg by mouth at bedtime.    . verapamil (CALAN-SR) 180 MG CR tablet Take 180 mg by mouth.     No current facility-administered medications on file prior to visit.    No Known Allergies Social History   Social History  . Marital status: Single    Spouse name: N/A  . Number of children: N/A  . Years of education: N/A   Occupational History  . Not on file.   Social History Main Topics  . Smoking status: Never Smoker  . Smokeless tobacco: Never Used  . Alcohol use Yes     Comment: Rare  . Drug use: No  . Sexual  activity: Not on file     Comment: Married, works for [employer].   Other Topics Concern  . Not on file   Social History Narrative  . No narrative on file      Review of Systems  All other systems reviewed and are negative.      Objective:   Physical Exam  Constitutional: He appears well-developed and well-nourished.  Cardiovascular: Normal rate, regular rhythm and normal heart sounds.   Pulmonary/Chest: Effort normal and breath sounds normal. No respiratory distress. He has no wheezes. He has no rales. He exhibits no tenderness.  Psychiatric: He has a normal mood and affect. His behavior is normal. Judgment and thought content normal.  Vitals reviewed.         Assessment & Plan:  Sebaceous cyst -  Plan: Ambulatory referral to General Surgery  3 cm inflamed sebaceous cyst on the lower abdomen just above the suprapubic space. We'll consult general surgery for an excision. If worsening, he will need incision and drainage. Regarding his anxiety, I will have the patient discontinue Xanax and start Lexapro 10 mg a day and reassess in one month. I also encouraged him to a joint appointment with the therapist. I believe a lot of his issues are anxiety related and a psychologist may help better manage his anxiety the medication alone

## 2017-04-24 ENCOUNTER — Telehealth: Payer: Self-pay | Admitting: Family Medicine

## 2017-04-24 NOTE — Telephone Encounter (Signed)
Pt calling in regards to referral to [facility]. Says his situation has got worse and needs to be seen ASAP to have this removed. Please call.

## 2017-04-25 NOTE — Telephone Encounter (Signed)
Pt called back and has scheduled appt with [facility] for this am at 11.

## 2017-04-25 NOTE — Telephone Encounter (Signed)
Called pt LMOVM that it could be several things 1) hematoma? 2) necrotic? - informed pt if it has changed that quickly to go to urgent care or if he thinks it can wait until Thursday we can put him on schedule to see Dr. Dennard Schaumann.

## 2017-04-25 NOTE — Telephone Encounter (Signed)
Patient called back this morning stating his area is now turning black. I have faxed the referral to [facility]. He wants to know if he should come back in and see Dr. Dennard Schaumann.  CB#  [phone]

## 2017-06-01 ENCOUNTER — Encounter: Payer: Self-pay | Admitting: Family Medicine

## 2017-06-01 ENCOUNTER — Ambulatory Visit (INDEPENDENT_AMBULATORY_CARE_PROVIDER_SITE_OTHER): Payer: BC Managed Care – PPO | Admitting: Family Medicine

## 2017-06-01 VITALS — BP 110/78 | HR 84 | Temp 98.0°F | Resp 18 | Ht 71.0 in | Wt 268.0 lb

## 2017-06-01 DIAGNOSIS — F411 Generalized anxiety disorder: Secondary | ICD-10-CM

## 2017-06-01 DIAGNOSIS — F429 Obsessive-compulsive disorder, unspecified: Secondary | ICD-10-CM
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
## 2017-06-20 ENCOUNTER — Telehealth: Payer: Self-pay | Admitting: Family Medicine

## 2017-06-20 NOTE — Telephone Encounter (Signed)
Pt states that he is having a reaction to the med sample that pickard gave him, he does not know the name of the medication. Please call him back.
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
## 2017-06-21 NOTE — Telephone Encounter (Signed)
Spoke with patient express understanding Has appt 06/25/2017 to verify rash

## 2017-06-21 NOTE — Telephone Encounter (Signed)
Itching in feet and groin sound like it may be athletes foot and jock itch given heat and sweat.  I doubt its trintellix if that is the only spot he is itching.  I would try lotrimin cream applied bid to both areas prior to stopping the med. If he develops diffuse rash, then I would stop trintellix by reducing dose 50% 1 week then discontinue.

## 2017-06-25 ENCOUNTER — Ambulatory Visit (INDEPENDENT_AMBULATORY_CARE_PROVIDER_SITE_OTHER): Payer: BC Managed Care – PPO | Admitting: Family Medicine

## 2017-06-25 ENCOUNTER — Encounter: Payer: Self-pay | Admitting: Family Medicine

## 2017-06-25 VITALS — BP 120/76 | HR 70 | Temp 97.9°F | Resp 18 | Ht 71.0 in | Wt 260.0 lb

## 2017-06-25 DIAGNOSIS — I10 Essential (primary) hypertension: Secondary | ICD-10-CM

## 2017-06-25 DIAGNOSIS — F429 Obsessive-compulsive disorder, unspecified: Secondary | ICD-10-CM

## 2017-06-25 DIAGNOSIS — L304 Erythema intertrigo: Secondary | ICD-10-CM

## 2017-06-25 DIAGNOSIS — F411 Generalized anxiety disorder: Secondary | ICD-10-CM | POA: Diagnosis not present

## 2017-06-25 MED ORDER — LOSARTAN POTASSIUM-HCTZ 50-12.5 MG PO TABS: 1.0000 | ORAL_TABLET | Freq: Every day | ORAL | 3 refills | Status: DC

## 2017-06-25 MED ORDER — FLUCONAZOLE 150 MG PO TABS: 150.0000 mg | ORAL_TABLET | ORAL | 0 refills | Status: DC

## 2017-06-25 NOTE — Progress Notes (Signed)
Subjective:    Patient ID: Darren Baker, male    DOB: 1971-04-16, 46 y.o.   MRN: [id number]  HPI  02/15/17 Patient works in [employer].  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 He has no experience in this area. He's been on this case in September. He feels unprepared and this causes a tremendous amount of anxiety for him. Furthermore he is constantly anxious. He denies any panic attacks. He denies any depression. He does occasionally report trouble sleeping due to anxiety. He states that from an early age, he has demonstrated obsessive-compulsive characteristics. For instance, he will frequently check the locks on the dorsal 4 times before he would leave the home. He acknowledges that this is unnecessary but he did not do these things, he would feel extremely anxious. He also states that "is silly as it sounds" he would feel like something bad would happen to him if he did not follow through on his routine.  He would perform similar habits as far as checking the iron, checking the stove, checking his file cabinets, checking his sink. He denies any depression or anhedonia. He denies any delusions or hallucinations. He denies any paranoia. He does report inability to focus. I believe his trouble focusing sitting is because he is so anxious on a daily basis. He tends to avoid the difficult case and focus on easy past that he can accomplish quickly. However this gives him further behind and increases his deadline causing even more anxiety. He does have somewhat pressured speech. He tends to jump from subject to subject while talking. Other than that, he demonstrates no history of bipolar tendencies. He is not overly animated are hyperactive. He denies getting in trouble for hyperactivity in school. He denies problems with focusing in school although some of his symptoms sound consistent with ADD.  At that time, my plan was: Some of his symptoms are consistent with ADD  particularly his trouble focusing and avoiding tasks that require sustained effort. However most of his symptoms are consistent with obsessive-compulsive disorder. I believe that the patient has underlying anxiety disorder and obsessive-compulsive disorder. I believe that his recent struggle with his assigned criminal case has made him feel more uncomfortable and unprepared exacerbating his OCD and his anxiety. I really believe that the patient would benefit from a medication such as Lexapro to address OCD and anxiety. However he needs something more immediate to help calm him down that he can focus as this case comes to a close within the next 3-4 weeks. Therefore we will try Xanax on an as-needed basis temporarily over the next 3-4 weeks to see if this can help calm him down to focus. Moving forward in the future, if he does see benefit from Xanax, I would consider starting him on Lexapro. If the medications are ineffective, I will keep ADD in the back of my mind.  04/23/17  Patient has had a subcutaneous mass below his umbilicus at the crease in his abdomen for quite some time. Recently it has become more swollen. It is now draining foul-smelling material to a poor in the skin. It is mildly tender to the touch. The skin around it is indurated. Symptoms are consistent with an inflamed sebaceous cyst. He is requesting referral to a surgeon for excision. In the past he underwent incision and drainage and the cyst has returned and therefore he would like it removed in its entirety. Given its location, I believe this to  be better, was by general surgeon. He also saw only minimal benefit from the Xanax. However he was taking a half a tablet. If he took a whole tablet, this would help calm him down and help him sleep. He continues to suffer from anxiety. He is interested in trying Lexapro to help manage the anxiety.  At that time, my plan was: 3 cm inflamed sebaceous cyst on the lower abdomen just above the  suprapubic space. We'll consult general surgery for an excision. If worsening, he will need incision and drainage. Regarding his anxiety, I will have the patient discontinue Xanax and start Lexapro 10 mg a day and reassess in one month. I also encouraged him to a joint appointment with the therapist. I believe a lot of his issues are anxiety related and a psychologist may help better manage his anxiety the medication alone  06/01/17 The patient's wife has seen benefit since starting the Lexapro. She feels that he is calmer and less apt to lose his temper. She also feels that he is not as anxious. Patient denies any benefit on the medication. In fact he thinks is causing him to have blurry vision and also affecting his sex strive. He would like to try something different to help manage his anxiety and his OCD tendencies.  At that time, my plan was: Discontinue Lexapro and replaced with Trintellix 5 mg a day for 1 week and then 10 mg poqday thereafter and recheck in 1 month.  06/25/17 The patient has not seen any benefit since switching to Trintellix.  In fact after making the change, he developed itching on his upper chest, on the dorsums of both feet, and in the crease between his scrotum and his thigh under his scrotum. There is no visible rash on his upper chest. There is no rash on the dorsum of his feet. 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 Past Medical History:  Diagnosis Date  . DDD (degenerative disc disease), lumbar   . Hypertension   . Low back pain   . Migraine   . Obesity   . Post concussion syndrome    No past surgical history on file. Current Outpatient Prescriptions on File Prior to Visit  Medication Sig Dispense Refill  . eletriptan (RELPAX) 40 MG tablet Take 40 mg by mouth as needed for migraine. One tablet by mouth at onset of headache. May repeat in 2  hours if headache persists or recurs.    Marland Kitchen escitalopram (LEXAPRO) 10 MG tablet Take 1 tablet (10 mg total) by mouth daily. 30 tablet 5  . HYDROcodone-acetaminophen (NORCO/VICODIN) 5-325 MG per tablet Take 1 tablet by mouth every 6 (six) hours as needed for moderate pain.    Marland Kitchen losartan-hydrochlorothiazide (HYZAAR) 50-12.5 MG tablet TAKE 1 TABLET BY MOUTH DAILY. 90 tablet 3  . magnesium oxide (MAG-OX) 400 (241.3 MG) MG tablet TAKE 3-4 TABLETS PER DAY AS NEEDED FOR SPASMS    . meloxicam (MOBIC) 7.5 MG tablet Take 1 tablet (7.5 mg total) by mouth daily. (Patient taking differently: Take 7.5-15 mg by mouth daily. ) 30 tablet 5  . methocarbamol (ROBAXIN) 750 MG tablet TAKE 1 TABLET BY MOUTH FOUR TIMES A DAY AS NEEDED  1  . verapamil (CALAN-SR) 240 MG CR tablet Take 240 mg by mouth daily.  3  . ALPRAZolam (XANAX) 0.5 MG tablet Take 1 tablet (0.5 mg  total) by mouth 3 (three) times daily as needed for anxiety. (Patient not taking: Reported on 06/01/2017) 30 tablet 0   No current facility-administered medications on file prior to visit.    No Known Allergies Social History   Social History  . Marital status: Single    Spouse name: N/A  . Number of children: N/A  . Years of education: N/A   Occupational History  . Not on file.   Social History Main Topics  . Smoking status: Never Smoker  . Smokeless tobacco: Never Used  . Alcohol use Yes     Comment: Rare  . Drug use: No  . Sexual activity: Not on file     Comment: Married, works for [employer].   Other Topics Concern  . Not on file   Social History Narrative  . No narrative on file      Review of Systems  All other systems reviewed and are negative.      Objective:   Physical Exam  Constitutional: He appears well-developed and well-nourished.  Cardiovascular: Normal rate, regular rhythm and normal heart sounds.   Pulmonary/Chest: Effort normal and breath sounds normal. No respiratory distress. He has no wheezes. He has no  rales. He exhibits no tenderness.  Skin: Rash noted. There is erythema.  Psychiatric: He has a normal mood and affect. His behavior is normal. Judgment and thought content normal.  Vitals reviewed.         Assessment & Plan:  Obsessive-compulsive disorder, unspecified type  Benign essential HTN - Plan: losartan-hydrochlorothiazide (HYZAAR) 50-12.5 MG tablet  GAD (generalized anxiety disorder)  Intertrigo  I am not sure if the itching on his chest or on the tops of his feet have anything to do with the change in his medication. There is no visible rash and he states that the itching is getting better. He would like to try the trintellix 10 mg a day for an additional 2-3 weeks. If no benefit is seen at that time, I would recommend weaning off the medication and consult and psychiatry. I refilled his blood pressure medicine today as his blood pressure is well controlled and he is out of the medication. However I do believe that the rash in his private area is Candida intertrigo. He is tried Lotrimin cream twice daily with no benefit. Therefore I'm going to switch the patient to Diflucan 150 mg by mouth every other day for 7 doses. He can continue to use Lotrimin cream in that area as a barrier cream we also discussed the need to keep the area dry and clean.

## 2017-07-05 ENCOUNTER — Telehealth: Payer: Self-pay | Admitting: Family Medicine

## 2017-07-05 NOTE — Telephone Encounter (Signed)
lvmtrc  

## 2017-07-05 NOTE — Telephone Encounter (Addendum)
Dr Dennard Schaumann told this patient to call back if he was no better from last week, he says he needs to have a couple more days of the antibiotic, and also wants to talk about additional medications [phone]

## 2017-07-06 MED ORDER — FLUCONAZOLE 150 MG PO TABS: 150.0000 mg | ORAL_TABLET | ORAL | 0 refills | Status: DC

## 2017-07-06 NOTE — Telephone Encounter (Signed)
rx sent over to pharmacy patient aware of provider recommendations

## 2017-07-06 NOTE — Telephone Encounter (Signed)
Spoke with patient he states he is a lot better but has not cleared up completely and would lik another round of diflucan .Patient states he is breaking  out on chest area and  itching on top of feet.Patient is wanting to stop the trintellix   Pls advise

## 2017-07-06 NOTE — Telephone Encounter (Signed)
Stop the trintellix, 1 pill every otherday for a week then every third day for a week then stop. Diflucan 150 poQOD for 7 doses ( 7 pills)

## 2017-08-09 ENCOUNTER — Encounter: Payer: Self-pay | Admitting: Family Medicine

## 2017-08-09 ENCOUNTER — Other Ambulatory Visit: Payer: Self-pay

## 2017-08-09 ENCOUNTER — Ambulatory Visit: Payer: BC Managed Care – PPO | Admitting: Family Medicine

## 2017-08-09 VITALS — BP 120/72 | HR 94 | Temp 97.8°F | Resp 18 | Ht 71.0 in | Wt 272.0 lb

## 2017-08-09 DIAGNOSIS — F429 Obsessive-compulsive disorder, unspecified: Secondary | ICD-10-CM

## 2017-08-09 DIAGNOSIS — L304 Erythema intertrigo: Secondary | ICD-10-CM

## 2017-08-09 DIAGNOSIS — F411 Generalized anxiety disorder: Secondary | ICD-10-CM

## 2017-08-09 MED ORDER — CLOTRIMAZOLE-BETAMETHASONE 1-0.05 % EX CREA: 1.0000 "application " | TOPICAL_CREAM | Freq: Two times a day (BID) | CUTANEOUS | 0 refills | Status: DC

## 2017-08-09 MED ORDER — FLUOXETINE HCL 10 MG PO CAPS: 10.0000 mg | ORAL_CAPSULE | Freq: Every day | ORAL | 3 refills | Status: DC

## 2017-08-09 NOTE — Progress Notes (Signed)
Subjective:    Patient ID: Darren Baker, male    DOB: April 12, 1971, 46 y.o.   MRN: [id number]  HPI  02/15/17 Patient works in [employer].  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 He has no experience in this area. He's been on this case in September. He feels unprepared and this causes a tremendous amount of anxiety for him. Furthermore he is constantly anxious. He denies any panic attacks. He denies any depression. He does occasionally report trouble sleeping due to anxiety. He states that from an early age, he has demonstrated obsessive-compulsive characteristics. For instance, he will frequently check the locks on the dorsal 4 times before he would leave the home. He acknowledges that this is unnecessary but he did not do these things, he would feel extremely anxious. He also states that "is silly as it sounds" he would feel like something bad would happen to him if he did not follow through on his routine.  He would perform similar habits as far as checking the iron, checking the stove, checking his file cabinets, checking his sink. He denies any depression or anhedonia. He denies any delusions or hallucinations. He denies any paranoia. He does report inability to focus. I believe his trouble focusing sitting is because he is so anxious on a daily basis. He tends to avoid the difficult case and focus on easy past that he can accomplish quickly. However this gives him further behind and increases his deadline causing even more anxiety. He does have somewhat pressured speech. He tends to jump from subject to subject while talking. Other than that, he demonstrates no history of bipolar tendencies. He is not overly animated are hyperactive. He denies getting in trouble for hyperactivity in school. He denies problems with focusing in school although some of his symptoms sound consistent with ADD.  At that time, my plan was: Some of his symptoms are consistent with ADD  particularly his trouble focusing and avoiding tasks that require sustained effort. However most of his symptoms are consistent with obsessive-compulsive disorder. I believe that the patient has underlying anxiety disorder and obsessive-compulsive disorder. I believe that his recent struggle with his assigned criminal case has made him feel more uncomfortable and unprepared exacerbating his OCD and his anxiety. I really believe that the patient would benefit from a medication such as Lexapro to address OCD and anxiety. However he needs something more immediate to help calm him down that he can focus as this case comes to a close within the next 3-4 weeks. Therefore we will try Xanax on an as-needed basis temporarily over the next 3-4 weeks to see if this can help calm him down to focus. Moving forward in the future, if he does see benefit from Xanax, I would consider starting him on Lexapro. If the medications are ineffective, I will keep ADD in the back of my mind.  04/23/17  Patient has had a subcutaneous mass below his umbilicus at the crease in his abdomen for quite some time. Recently it has become more swollen. It is now draining foul-smelling material to a poor in the skin. It is mildly tender to the touch. The skin around it is indurated. Symptoms are consistent with an inflamed sebaceous cyst. He is requesting referral to a surgeon for excision. In the past he underwent incision and drainage and the cyst has returned and therefore he would like it removed in its entirety. Given its location, I believe this to  be better, was by general surgeon. He also saw only minimal benefit from the Xanax. However he was taking a half a tablet. If he took a whole tablet, this would help calm him down and help him sleep. He continues to suffer from anxiety. He is interested in trying Lexapro to help manage the anxiety.  At that time, my plan was: 3 cm inflamed sebaceous cyst on the lower abdomen just above the  suprapubic space. We'll consult general surgery for an excision. If worsening, he will need incision and drainage. Regarding his anxiety, I will have the patient discontinue Xanax and start Lexapro 10 mg a day and reassess in one month. I also encouraged him to a joint appointment with the therapist. I believe a lot of his issues are anxiety related and a psychologist may help better manage his anxiety the medication alone  06/01/17 The patient's wife has seen benefit since starting the Lexapro. She feels that he is calmer and less apt to lose his temper. She also feels that he is not as anxious. Patient denies any benefit on the medication. In fact he thinks is causing him to have blurry vision and also affecting his sex strive. He would like to try something different to help manage his anxiety and his OCD tendencies.  At that time, my plan was: Discontinue Lexapro and replaced with Trintellix 5 mg a day for 1 week and then 10 mg poqday thereafter and recheck in 1 month.  06/25/17 The patient has not seen any benefit since switching to Trintellix.  In fact after making the change, he developed itching on his upper chest, on the dorsums of both feet, and in the crease between his scrotum and his thigh under his scrotum. There is no visible rash on his upper chest. There is no rash on the dorsum of his feet. 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  At that time, my plan was: I am not sure if the itching on his chest or on the tops of his feet have anything to do with the change in his medication. There is no visible rash and he states that the itching is getting better. He would like to try the trintellix 10 mg a day for an additional 2-3 weeks. If no benefit is seen at that time, I would recommend weaning off the medication and consult and psychiatry. I refilled his blood pressure  medicine today as his blood pressure is well controlled and he is out of the medication. However I do believe that the rash in his private area is Candida intertrigo. He is tried Lotrimin cream twice daily with no benefit. Therefore I'm going to switch the patient to Diflucan 150 mg by mouth every other day for 7 doses. He can continue to use Lotrimin cream in that area as a barrier cream we also discussed the need to keep the area dry and clean.  08/09/17 Patient is here today stating that he needs something for his anxiety.  His wife saw a definite benefit while he was taking Lexapro.  However this medication caused blurry vision.  He saw benefit while taking Trintellix but the medication caused itching and a rash.  In the past he has tried Effexor for migraines which made him feel terrible.  However he continues to have problems with anxiety, OCD tendencies, and he is requesting to  try different alternative.  He also continues to complain of a rash in his private area.  The rash includes the skin on his upper bilateral thighs, in the crease of his legs, and onto his scrotum.  It is no longer bright red and erythematous.  It is now pink and hyperpigmented showing chronic inflammation but no active fungal infection.  He has seen no benefit from the Diflucan.  He is tried Lotrimin without benefit.  States that the area stays damp and wet constantly causing the irritation Past Medical History:  Diagnosis Date  . DDD (degenerative disc disease), lumbar   . Hypertension   . Low back pain   . Migraine   . Obesity   . Post concussion syndrome    No past surgical history on file. Current Outpatient Medications on File Prior to Visit  Medication Sig Dispense Refill  . eletriptan (RELPAX) 40 MG tablet Take 40 mg by mouth as needed for migraine. One tablet by mouth at onset of headache. May repeat in 2 hours if headache persists or recurs.    Marland Kitchen HYDROcodone-acetaminophen (NORCO/VICODIN) 5-325 MG per tablet  Take 1 tablet by mouth every 6 (six) hours as needed for moderate pain.    Marland Kitchen losartan-hydrochlorothiazide (HYZAAR) 50-12.5 MG tablet Take 1 tablet by mouth daily. 90 tablet 3  . magnesium oxide (MAG-OX) 400 (241.3 MG) MG tablet TAKE 3-4 TABLETS PER DAY AS NEEDED FOR SPASMS    . meloxicam (MOBIC) 7.5 MG tablet Take 1 tablet (7.5 mg total) by mouth daily. (Patient taking differently: Take 7.5-15 mg by mouth daily. ) 30 tablet 5  . methocarbamol (ROBAXIN) 750 MG tablet TAKE 1 TABLET BY MOUTH FOUR TIMES A DAY AS NEEDED  1  . verapamil (CALAN-SR) 240 MG CR tablet Take 240 mg by mouth daily.  3  . ALPRAZolam (XANAX) 0.5 MG tablet Take 1 tablet (0.5 mg total) by mouth 3 (three) times daily as needed for anxiety. (Patient not taking: Reported on 08/09/2017) 30 tablet 0   No current facility-administered medications on file prior to visit.    No Known Allergies Social History   Socioeconomic History  . Marital status: Single    Spouse name: Not on file  . Number of children: Not on file  . Years of education: Not on file  . Highest education level: Not on file  Social Needs  . Financial resource strain: Not on file  . Food insecurity - worry: Not on file  . Food insecurity - inability: Not on file  . Transportation needs - medical: Not on file  . Transportation needs - non-medical: Not on file  Occupational History  . Not on file  Tobacco Use  . Smoking status: Never Smoker  . Smokeless tobacco: Never Used  Substance and Sexual Activity  . Alcohol use: Yes    Comment: Rare  . Drug use: No  . Sexual activity: Not on file    Comment: Married, works for [employer].  Other Topics Concern  . Not on file  Social History Narrative  . Not on file      Review of Systems  All other systems reviewed and are negative.      Objective:   Physical Exam  Constitutional: He appears well-developed and well-nourished.  Cardiovascular: Normal rate, regular rhythm and normal heart sounds.    Pulmonary/Chest: Effort normal and breath sounds normal. No respiratory distress. He has no wheezes. He has no rales. He exhibits no tenderness.  Genitourinary:  Skin: Rash noted. There is erythema.  Psychiatric: He has a normal mood and affect. His behavior is normal. Judgment and thought content normal.  Vitals reviewed.         Assessment & Plan:  Obsessive-compulsive disorder, unspecified type  GAD (generalized anxiety disorder)  Intertrigo  I explained to the patient that intertrigo is a combination of yeast, sweat and moisture, heat, and friction.  I do not see an active fungal infection today given the lack of erythema.  I do see chronic irritation given the hyperpigmentation and chronic skin changes.  I recommended addressing the heat and friction by weight loss.  I recommended addressing the sweat and moisture by keeping the area as dry as possible and using xerac ac 6.25% applied once daily and rinsed off the following day.  Once the sweating is controlled, he can switch to twice a week.  Meanwhile he can use Lotrisone applied twice daily as needed for inflammation.  Regarding his OCD and GAD, I recommended trying Prozac 10 mg a day and recheck in 1 month

## 2017-08-22 ENCOUNTER — Other Ambulatory Visit: Payer: Self-pay | Admitting: Family Medicine

## 2017-08-22 MED ORDER — ALUMINUM CHLORIDE IN ALCOHOL 15 % EX SOLN: 1.0000 "application " | Freq: Every day | CUTANEOUS | 1 refills | Status: DC

## 2017-08-31 ENCOUNTER — Telehealth: Payer: Self-pay | Admitting: Family Medicine

## 2017-08-31 NOTE — Telephone Encounter (Signed)
Patient is calling to say he is looking for advice to try to get off "prozac" Please call [phone] to advise

## 2017-09-03 NOTE — Telephone Encounter (Signed)
He started 10 mg 11/8.  Such a low dose should be able to stop it.  But he could first go to every other day for 2 weeks then stop.

## 2017-09-03 NOTE — Telephone Encounter (Signed)
Pt aware via vm and aware that the medications that have been recommended (Xerac and Hypercare) which the pharm is not able to get however we can send him to see a dermatologist and for him to call back if he would like for [place] to set up that apt for him.

## 2018-02-01 ENCOUNTER — Other Ambulatory Visit: Payer: BC Managed Care – PPO

## 2018-02-01 DIAGNOSIS — Z0000 Encounter for general adult medical examination without abnormal findings: Secondary | ICD-10-CM

## 2018-02-01 DIAGNOSIS — Z125 Encounter for screening for malignant neoplasm of prostate: Secondary | ICD-10-CM

## 2018-02-01 DIAGNOSIS — I10 Essential (primary) hypertension: Secondary | ICD-10-CM

## 2018-02-01 LAB — LIPID PANEL
Cholesterol: 123 mg/dL (ref <200)
HDL: 47 mg/dL (ref >40)
LDL Cholesterol (Calc): 61 mg/dL (calc)
Non-HDL Cholesterol (Calc): 76 mg/dL (calc) (ref <130)
TRIGLYCERIDES: 72 mg/dL (ref <150)
Total CHOL/HDL Ratio: 2.6 (calc) (ref <5.0)

## 2018-02-01 LAB — COMPREHENSIVE METABOLIC PANEL
AG RATIO: 2 (calc) (ref 1.0–2.5)
ALT: 15 U/L (ref 9–46)
AST: 17 U/L (ref 10–40)
Albumin: 4.3 g/dL (ref 3.6–5.1)
Alkaline phosphatase (APISO): 71 U/L (ref 40–115)
BUN: 24 mg/dL (ref 7–25)
CHLORIDE: 108 mmol/L (ref 98–110)
CO2: 29 mmol/L (ref 20–32)
Calcium: 9.6 mg/dL (ref 8.6–10.3)
Creat: 1.14 mg/dL (ref 0.60–1.35)
Globulin: 2.1 g/dL (calc) (ref 1.9–3.7)
Glucose, Bld: 93 mg/dL (ref 65–99)
Potassium: 4.6 mmol/L (ref 3.5–5.3)
SODIUM: 141 mmol/L (ref 135–146)
TOTAL PROTEIN: 6.4 g/dL (ref 6.1–8.1)
Total Bilirubin: 0.4 mg/dL (ref 0.2–1.2)

## 2018-02-01 LAB — PSA: PSA: 0.7 ng/mL (ref <=4.0)

## 2018-02-02 LAB — CBC WITH DIFFERENTIAL/PLATELET
BASOS ABS: 59 {cells}/uL (ref 0–200)
Basophils Relative: 0.9 %
Eosinophils Absolute: 267 cells/uL (ref 15–500)
Eosinophils Relative: 4.1 %
HEMATOCRIT: 38.9 % (ref 38.5–50.0)
HEMOGLOBIN: 13.2 g/dL (ref 13.2–17.1)
Lymphs Abs: 1216 cells/uL (ref 850–3900)
MCH: 28.8 pg (ref 27.0–33.0)
MCHC: 33.9 g/dL (ref 32.0–36.0)
MCV: 84.9 fL (ref 80.0–100.0)
MONOS PCT: 11.2 %
MPV: 10.9 fL (ref 7.5–12.5)
NEUTROS PCT: 65.1 %
Neutro Abs: 4232 cells/uL (ref 1500–7800)
Platelets: 298 10*3/uL (ref 140–400)
RBC: 4.58 10*6/uL (ref 4.20–5.80)
RDW: 12.8 % (ref 11.0–15.0)
Total Lymphocyte: 18.7 %
WBC mixed population: 728 cells/uL (ref 200–950)
WBC: 6.5 10*3/uL (ref 3.8–10.8)

## 2018-02-02 LAB — HEMOGLOBIN A1C
HEMOGLOBIN A1C: 5.5 %{Hb} (ref <5.7)
Mean Plasma Glucose: 111 (calc)
eAG (mmol/L): 6.2 (calc)

## 2018-02-08 ENCOUNTER — Ambulatory Visit (INDEPENDENT_AMBULATORY_CARE_PROVIDER_SITE_OTHER): Payer: BC Managed Care – PPO | Admitting: Family Medicine

## 2018-02-08 ENCOUNTER — Encounter: Payer: Self-pay | Admitting: Family Medicine

## 2018-02-08 VITALS — BP 110/70 | HR 86 | Temp 98.0°F | Resp 18 | Ht 71.0 in | Wt 260.0 lb

## 2018-02-08 DIAGNOSIS — Z0000 Encounter for general adult medical examination without abnormal findings: Secondary | ICD-10-CM | POA: Diagnosis not present

## 2018-02-08 NOTE — Progress Notes (Signed)
Subjective:    Patient ID: Darren Baker, male    DOB: 05-10-1971, 47 y.o.   MRN: [id number]  HPI  patient presents today with numerous concerns.  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  After that he developed head congestion, rhinorrhea, and sinus pressure with postnasal drip.  Ever since that time, he has felt a tickle sensation in the back of his throat causing him to cough.  He is not taking anything for sinuses or allergies.  He denies any fevers or chills.  Second he is concerned about the rash that he has on his lower abdomen and in between the crease between his thighs and his scrotum.  He reports occasional erythema in this area and skin irritation.  He admits to sweating.  He is trying to dry off well after showers however he is not using any type of medicated powder.  When he uses the Lotrisone cream, the rash improves within a day or 2 but it comes back once he stops it.  Second he wants to discuss stress at work.  He feels like he is constantly under pressure and anxiety.  Third he reports constipation.  Ever since he discontinued alli, he has had a difficult time having bowel movements.  I gave the patient Linzess however this caused diarrhea and he was unable to tolerate it.  He cannot tolerate MiraLAX because he hates to take a powder.  He would like something this a pill form to help him go to the bathroom.  He states at times, it feels like his bowel movements are tearing him and cause severe pain.  At other times he is even noticed trace bright red blood covering the stool and on the toilet tissue when he wipes.  This only occurs after a particularly painful bowel movement after several days of constipation.  His most recent lab work as listed below: Lab on 02/01/2018  Component Date Value Ref Range Status  . WBC 02/01/2018 6.5  3.8 - 10.8 Thousand/uL Final  . RBC 02/01/2018 4.58  4.20 - 5.80 Million/uL Final  . Hemoglobin 02/01/2018  13.2  13.2 - 17.1 g/dL Final  . HCT 02/01/2018 38.9  38.5 - 50.0 % Final  . MCV 02/01/2018 84.9  80.0 - 100.0 fL Final  . MCH 02/01/2018 28.8  27.0 - 33.0 pg Final  . MCHC 02/01/2018 33.9  32.0 - 36.0 g/dL Final  . RDW 02/01/2018 12.8  11.0 - 15.0 % Final  . Platelets 02/01/2018 298  140 - 400 Thousand/uL Final  . MPV 02/01/2018 10.9  7.5 - 12.5 fL Final  . Neutro Abs 02/01/2018 4,232  1,500 - 7,800 cells/uL Final  . Lymphs Abs 02/01/2018 1,216  850 - 3,900 cells/uL Final  . WBC mixed population 02/01/2018 728  200 - 950 cells/uL Final  . Eosinophils Absolute 02/01/2018 267  15 - 500 cells/uL Final  . Basophils Absolute 02/01/2018 59  0 - 200 cells/uL Final  . Neutrophils Relative % 02/01/2018 65.1  % Final  . Total Lymphocyte 02/01/2018 18.7  % Final  . Monocytes Relative 02/01/2018 11.2  % Final  . Eosinophils Relative 02/01/2018 4.1  % Final  . Basophils Relative 02/01/2018 0.9  % Final  . Glucose, Bld 02/01/2018 93  65 - 99 mg/dL Final   Comment: .            Fasting reference interval .   . BUN 02/01/2018 24  7 - 25 mg/dL Final  . Creat 02/01/2018 1.14  0.60 - 1.35 mg/dL Final  . BUN/Creatinine Ratio 22/11/5425 NOT APPLICABLE  6 - 22 (calc) Final  . Sodium 02/01/2018 141  135 - 146 mmol/L Final  . Potassium 02/01/2018 4.6  3.5 - 5.3 mmol/L Final  . Chloride 02/01/2018 108  98 - 110 mmol/L Final  . CO2 02/01/2018 29  20 - 32 mmol/L Final  . Calcium 02/01/2018 9.6  8.6 - 10.3 mg/dL Final  . Total Protein 02/01/2018 6.4  6.1 - 8.1 g/dL Final  . Albumin 02/01/2018 4.3  3.6 - 5.1 g/dL Final  . Globulin 02/01/2018 2.1  1.9 - 3.7 g/dL (calc) Final  . AG Ratio 02/01/2018 2.0  1.0 - 2.5 (calc) Final  . Total Bilirubin 02/01/2018 0.4  0.2 - 1.2 mg/dL Final  . Alkaline phosphatase (APISO) 02/01/2018 71  40 - 115 U/L Final  . AST 02/01/2018 17  10 - 40 U/L Final  . ALT 02/01/2018 15  9 - 46 U/L Final  . Hgb A1c MFr Bld 02/01/2018 5.5  <5.7 % of total Hgb Final   Comment: For the  purpose of screening for the presence of diabetes: . <5.7%       Consistent with the absence of diabetes 5.7-6.4%    Consistent with increased risk for diabetes             (prediabetes) > or =6.5%  Consistent with diabetes . This assay result is consistent with a decreased risk of diabetes. . Currently, no consensus exists regarding use of hemoglobin A1c for diagnosis of diabetes in children. . According to American Diabetes Association (ADA) guidelines, hemoglobin A1c <7.0% represents optimal control in non-pregnant diabetic patients. Different metrics may apply to specific patient populations.  Standards of Medical Care in Diabetes(ADA). .   . Mean Plasma Glucose 02/01/2018 111  (calc) Final  . eAG (mmol/L) 02/01/2018 6.2  (calc) Final  . Cholesterol 02/01/2018 123  <200 mg/dL Final  . HDL 02/01/2018 47  >40 mg/dL Final  . Triglycerides 02/01/2018 72  <150 mg/dL Final  . LDL Cholesterol (Calc) 02/01/2018 61  mg/dL (calc) Final   Comment: Reference range: <100 . Desirable range <100 mg/dL for primary prevention;   <70 mg/dL for patients with CHD or diabetic patients  with > or = 2 CHD risk factors. Marland Kitchen LDL-C is now calculated using the Martin-Hopkins  calculation, which is a validated novel method providing  better accuracy than the Friedewald equation in the  estimation of LDL-C.  Cresenciano Genre et al. Annamaria Helling. 0623;762(83): 2061-2068  (http://education.[facility].com/faq/FAQ164)   . Total CHOL/HDL Ratio 02/01/2018 2.6  <5.0 (calc) Final  . Non-HDL Cholesterol (Calc) 02/01/2018 76  <130 mg/dL (calc) Final   Comment: For patients with diabetes plus 1 major ASCVD risk  factor, treating to a non-HDL-C goal of <100 mg/dL  (LDL-C of <70 mg/dL) is considered a therapeutic  option.   Marland Kitchen PSA 02/01/2018 0.7  < OR = 4.0 ng/mL Final   Comment: The total PSA value from this assay system is  standardized against the WHO standard. The test  result will be approximately 20% lower  when compared  to the equimolar-standardized total PSA (Beckman  Coulter). Comparison of serial PSA results should be  interpreted with this fact in mind. . This test was performed using the Siemens  chemiluminescent method. Values obtained from  different assay methods cannot be used interchangeably. PSA levels, regardless of value, should not be interpreted as  absolute evidence of the presence or absence of disease.      Past Medical History:  Diagnosis Date  . DDD (degenerative disc disease), lumbar   . Hypertension   . Low back pain   . Migraine   . Obesity   . Post concussion syndrome    Current Outpatient Medications on File Prior to Visit  Medication Sig Dispense Refill  . eletriptan (RELPAX) 40 MG tablet Take 40 mg by mouth as needed for migraine. One tablet by mouth at onset of headache. May repeat in 2 hours if headache persists or recurs.    Marland Kitchen HYDROcodone-acetaminophen (NORCO/VICODIN) 5-325 MG per tablet Take 1 tablet by mouth every 6 (six) hours as needed for moderate pain.    Marland Kitchen losartan-hydrochlorothiazide (HYZAAR) 50-12.5 MG tablet Take 1 tablet by mouth daily. 90 tablet 3  . magnesium oxide (MAG-OX) 400 (241.3 MG) MG tablet TAKE 3-4 TABLETS PER DAY AS NEEDED FOR SPASMS    . meloxicam (MOBIC) 7.5 MG tablet Take 1 tablet (7.5 mg total) by mouth daily. (Patient taking differently: Take 7.5-15 mg by mouth daily. ) 30 tablet 5  . methocarbamol (ROBAXIN) 750 MG tablet TAKE 1 TABLET BY MOUTH FOUR TIMES A DAY AS NEEDED  1  . verapamil (CALAN-SR) 240 MG CR tablet Take 240 mg by mouth daily.  3   No current facility-administered medications on file prior to visit.    No Known Allergies Social History   Socioeconomic History  . Marital status: Single    Spouse name: Not on file  . Number of children: Not on file  . Years of education: Not on file  . Highest education level: Not on file  Occupational History  . Not on file  Social Needs  . Financial resource strain:  Not on file  . Food insecurity:    Worry: Not on file    Inability: Not on file  . Transportation needs:    Medical: Not on file    Non-medical: Not on file  Tobacco Use  . Smoking status: Never Smoker  . Smokeless tobacco: Never Used  Substance and Sexual Activity  . Alcohol use: Yes    Comment: Rare  . Drug use: No  . Sexual activity: Not on file    Comment: Married, works for [employer].  Lifestyle  . Physical activity:    Days per week: Not on file    Minutes per session: Not on file  . Stress: Not on file  Relationships  . Social connections:    Talks on phone: Not on file    Gets together: Not on file    Attends religious service: Not on file    Active member of club or organization: Not on file    Attends meetings of clubs or organizations: Not on file    Relationship status: Not on file  . Intimate partner violence:    Fear of current or ex partner: Not on file    Emotionally abused: Not on file    Physically abused: Not on file    Forced sexual activity: Not on file  Other Topics Concern  . Not on file  Social History Narrative  . Not on file   Family History  Problem Relation Age of Onset  . Diabetes Father   . Heart disease Father   . Hyperlipidemia Father   . Hypertension Father      Review of Systems  All other systems reviewed and are negative.  Objective:   Physical Exam  Constitutional: He is oriented to person, place, and time. He appears well-developed and well-nourished. No distress.  HENT:  Head: Normocephalic and atraumatic.  Right Ear: External ear normal.  Left Ear: External ear normal.  Nose: Nose normal.  Mouth/Throat: Oropharynx is clear and moist. No oropharyngeal exudate.  Eyes: Pupils are equal, round, and reactive to light. Conjunctivae and EOM are normal. Right eye exhibits no discharge. Left eye exhibits no discharge. No scleral icterus.  Neck: Normal range of motion. Neck supple. No JVD present. No tracheal  deviation present. No thyromegaly present.  Cardiovascular: Normal rate, regular rhythm, normal heart sounds and intact distal pulses. Exam reveals no gallop and no friction rub.  No murmur heard. Pulmonary/Chest: Effort normal and breath sounds normal. No stridor. No respiratory distress. He has no wheezes. He has no rales. He exhibits no tenderness.  Abdominal: Soft. Bowel sounds are normal. He exhibits no distension and no mass. There is no tenderness. There is no rebound and no guarding. No hernia.  Genitourinary: Rectum normal and penis normal.  Musculoskeletal: He exhibits no edema, tenderness or deformity.  Lymphadenopathy:    He has no cervical adenopathy.  Neurological: He is alert and oriented to person, place, and time. No cranial nerve deficit or sensory deficit. He exhibits normal muscle tone. Coordination normal.  Skin: Skin is warm. Rash noted. He is not diaphoretic. No pallor.  Vitals reviewed.         Assessment & Plan:  General medical exam I reviewed all the patient's lab work with the patient.  I tried to address all of his concerns.  However he had so many questions, I do not feel that I was adequately able to address all of his concerns.  First I recommended Xyzal 5 mg a day for postnasal drip and allergies.  Second I recommended that he try Goldbond powder every day to act as a desiccant powder to keep the sweat down in between the skin folds which is causing the intertrigo.  I also recommended that the patient continue to try to lose weight to help control the intertrigo.  Third I recommended that he start using either senna or Colace every day to help control his constipation.  I believe that his constipation is so severe that is causing him to occasionally experience bright red blood per rectum when he has the tearing bowel movements which sound like anal fissures.  On visual inspection of the rectum today I appreciate no anal fissure or hemorrhoid.  However if he  continues to notice bright red blood per rectum even after correcting his constipation, I want the patient see GI for a colonoscopy.  Patient will contact me next week and let me know if the symptoms have improved.  Patient will notify me immediately if he continues to experience occasional episodes of bright red blood per rectum.  I would have a low threshold to consult GI for a colonoscopy

## 2018-06-15 ENCOUNTER — Other Ambulatory Visit: Payer: Self-pay | Admitting: Family Medicine

## 2018-06-19 ENCOUNTER — Telehealth: Payer: Self-pay | Admitting: Family Medicine

## 2018-06-19 MED ORDER — LISINOPRIL 20 MG PO TABS: 20.0000 mg | ORAL_TABLET | Freq: Every day | ORAL | 3 refills | Status: DC

## 2018-06-19 NOTE — Telephone Encounter (Signed)
Pharm sent note stating Losartan on back order can we change medication. Per Dr. Dennard Schaumann ok to switch to Lisinopril 20mg  qd. Pharm aware and new rx sent to [facility].

## 2018-09-17 ENCOUNTER — Other Ambulatory Visit: Payer: Self-pay | Admitting: Family Medicine

## 2018-09-17 DIAGNOSIS — I10 Essential (primary) hypertension: Secondary | ICD-10-CM

## 2018-10-30 ENCOUNTER — Telehealth: Payer: Self-pay | Admitting: Family Medicine

## 2018-10-30 NOTE — Telephone Encounter (Signed)
Pt has been taking Linzess 72mg  and on most days he is having to take 2 pills and would like to know if we can increase the mg so he only has to take one. Wants to go to the next dose up form the 72mg .

## 2018-10-31 MED ORDER — LINACLOTIDE 145 MCG PO CAPS: 145.0000 ug | ORAL_CAPSULE | Freq: Every day | ORAL | 3 refills | Status: DC

## 2018-10-31 NOTE — Telephone Encounter (Signed)
Medication called/sent to requested pharmacy and pt aware 

## 2018-10-31 NOTE — Telephone Encounter (Signed)
Yes he can increase to 145 mcg poqday

## 2019-02-28 ENCOUNTER — Ambulatory Visit: Payer: BC Managed Care – PPO | Admitting: Family Medicine

## 2019-02-28 ENCOUNTER — Other Ambulatory Visit: Payer: Self-pay

## 2019-02-28 ENCOUNTER — Encounter: Payer: Self-pay | Admitting: Family Medicine

## 2019-02-28 VITALS — BP 130/76 | HR 74 | Temp 98.6°F | Resp 16 | Ht 71.0 in | Wt 254.0 lb

## 2019-02-28 DIAGNOSIS — R1011 Right upper quadrant pain: Secondary | ICD-10-CM | POA: Diagnosis not present

## 2019-02-28 MED ORDER — CLOTRIMAZOLE-BETAMETHASONE 1-0.05 % EX CREA: 1.0000 "application " | TOPICAL_CREAM | Freq: Two times a day (BID) | CUTANEOUS | 0 refills | Status: DC

## 2019-02-28 MED ORDER — HYDROCODONE-ACETAMINOPHEN 5-325 MG PO TABS: 1.0000 | ORAL_TABLET | Freq: Four times a day (QID) | ORAL | 0 refills | Status: AC | PRN

## 2019-02-28 NOTE — Progress Notes (Signed)
Subjective:    Patient ID: Darren Baker, male    DOB: Mar 16, 1971, 48 y.o.   MRN: [id number]  HPI Patient has had 2-3 episodes of sharp right upper quadrant abdominal pain.  It was intense.  It occurred unprovoked.  It lasted several hours and gradually subsided.  The last event occurred after eating a fatty meal.  He denies any fever.  He denies any jaundice.  He denies any acid reflux.  He denies any nausea or vomiting.  He would graded the pain as a scale of 5 or 6/10 when it occurred.  He is concerned that he may have gallstones.  He denies any melena or hematochezia.  He is also requesting that I begin to refill his hydrocodone.  He is currently on hydrocodone/acetaminophen 5/325 mg.  He gets 90 tablets.  This last 6 months.  His last prescription was filled in December.  He has been getting this through Dr. Ace Gins through [facility]. for degenerative disc disease in his back.  However due to changes in her clinic policy, she will no longer refill narcotic pain medication.  She is asked if I would be willing to fill the pain medication for him while she will still be willing to care for his chronic pain.  I agreed to this. Past Medical History:  Diagnosis Date  . DDD (degenerative disc disease), lumbar   . Hypertension   . Low back pain   . Migraine   . Obesity   . Post concussion syndrome     Current Outpatient Medications on File Prior to Visit  Medication Sig Dispense Refill  . eletriptan (RELPAX) 40 MG tablet Take 40 mg by mouth as needed for migraine. One tablet by mouth at onset of headache. May repeat in 2 hours if headache persists or recurs.    Marland Kitchen linaclotide (LINZESS) 145 MCG CAPS capsule Take 1 capsule (145 mcg total) by mouth daily before breakfast. 90 capsule 3  . losartan-hydrochlorothiazide (HYZAAR) 50-12.5 MG tablet TAKE 1 TABLET BY MOUTH EVERY DAY 90 tablet 3  . magnesium oxide (MAG-OX) 400 (241.3 MG) MG tablet Take 800 mg by mouth daily.     . meloxicam (MOBIC)  7.5 MG tablet Take 1 tablet (7.5 mg total) by mouth daily. (Patient taking differently: Take 7.5-15 mg by mouth daily. ) 30 tablet 5  . UBRELVY 100 MG TABS TAKE 1 TAB AT ONSET OF HEADACHE. MAY REPEAT IN 2 HOURS. MAX 2 TABS/24 HOURS    . verapamil (CALAN-SR) 240 MG CR tablet Take 240 mg by mouth daily.  3   No current facility-administered medications on file prior to visit.    No Known Allergies Social History   Socioeconomic History  . Marital status: Single    Spouse name: Not on file  . Number of children: Not on file  . Years of education: Not on file  . Highest education level: Not on file  Occupational History  . Not on file  Social Needs  . Financial resource strain: Not on file  . Food insecurity:    Worry: Not on file    Inability: Not on file  . Transportation needs:    Medical: Not on file    Non-medical: Not on file  Tobacco Use  . Smoking status: Never Smoker  . Smokeless tobacco: Never Used  Substance and Sexual Activity  . Alcohol use: Yes    Comment: Rare  . Drug use: No  . Sexual activity: Not on file  Comment: Married, works for Building [employer].  Lifestyle  . Physical activity:    Days per week: Not on file    Minutes per session: Not on file  . Stress: Not on file  Relationships  . Social connections:    Talks on phone: Not on file    Gets together: Not on file    Attends religious service: Not on file    Active member of club or organization: Not on file    Attends meetings of clubs or organizations: Not on file    Relationship status: Not on file  . Intimate partner violence:    Fear of current or ex partner: Not on file    Emotionally abused: Not on file    Physically abused: Not on file    Forced sexual activity: Not on file  Other Topics Concern  . Not on file  Social History Narrative  . Not on file      Review of Systems  All other systems reviewed and are negative.      Objective:   Physical Exam Vitals signs reviewed.   Constitutional:      General: He is not in acute distress.    Appearance: He is obese. He is not ill-appearing or toxic-appearing.  Cardiovascular:     Rate and Rhythm: Normal rate and regular rhythm.  Pulmonary:     Effort: Pulmonary effort is normal.     Breath sounds: Normal breath sounds.  Abdominal:     General: Bowel sounds are normal. There is no distension.     Palpations: Abdomen is soft.     Tenderness: There is no abdominal tenderness. There is no guarding.  Neurological:     Mental Status: He is alert.           Assessment & Plan:  RUQ pain - Plan: CBC with Differential/Platelet, COMPLETE METABOLIC PANEL WITH GFR, Lipase, US Abdomen Complete  Patient's pain sounds like biliary colic.  I will schedule the patient for right upper quadrant ultrasound.  Meanwhile check CBC, CMP, lipase.  I explained to the patient if he develops severe unrelenting right upper quadrant pain, fever, jaundice, he is directed to go immediately to the emergency room for possible cholecystitis or cholangitis.  Otherwise we will obtain a right upper quadrant ultrasound as an outpatient and if gallstones are found, I would consult general surgery to discuss cholecystectomy.  Other possibilities on the differential diagnosis due to his chronic NSAID use would be a duodenal ulcer however the pain pattern sounds more consistent with biliary colic.  I would be willing to give the patient hydrocodone/acetaminophen 5/325 1 p.o. every 6 hours as needed.  90 tablets will last 6 months.  He is using the medication sparingly for chronic low back pain that I am well aware of that he has been seeing a pain clinic for several years due to [employer] with his job.

## 2019-03-01 LAB — COMPLETE METABOLIC PANEL WITH GFR
AG Ratio: 1.8 (calc) (ref 1.0–2.5)
ALT: 13 U/L (ref 9–46)
AST: 16 U/L (ref 10–35)
Albumin: 4.2 g/dL (ref 3.6–5.1)
Alkaline phosphatase (APISO): 83 U/L (ref 35–144)
BUN: 17 mg/dL (ref 7–25)
CO2: 24 mmol/L (ref 20–32)
Calcium: 9.4 mg/dL (ref 8.6–10.3)
Chloride: 106 mmol/L (ref 98–110)
Creat: 1.14 mg/dL (ref 0.70–1.33)
GFR, Est African American: 86 mL/min/{1.73_m2} (ref >=60)
GFR, Est Non African American: 75 mL/min/{1.73_m2} (ref >=60)
Globulin: 2.3 g/dL (calc) (ref 1.9–3.7)
Glucose, Bld: 103 mg/dL — ABNORMAL HIGH (ref 65–99)
Potassium: 4.3 mmol/L (ref 3.5–5.3)
Sodium: 140 mmol/L (ref 135–146)
Total Bilirubin: 0.4 mg/dL (ref 0.2–1.2)
Total Protein: 6.5 g/dL (ref 6.1–8.1)

## 2019-03-01 LAB — CBC WITH DIFFERENTIAL/PLATELET
Absolute Monocytes: 756 cells/uL (ref 200–950)
Basophils Absolute: 63 cells/uL (ref 0–200)
Basophils Relative: 0.9 %
Eosinophils Absolute: 392 cells/uL (ref 15–500)
Eosinophils Relative: 5.6 %
HCT: 41.2 % (ref 38.5–50.0)
Hemoglobin: 14.1 g/dL (ref 13.2–17.1)
Lymphs Abs: 1701 cells/uL (ref 850–3900)
MCH: 29.4 pg (ref 27.0–33.0)
MCHC: 34.2 g/dL (ref 32.0–36.0)
MCV: 86 fL (ref 80.0–100.0)
MPV: 11.1 fL (ref 7.5–12.5)
Monocytes Relative: 10.8 %
Neutro Abs: 4088 cells/uL (ref 1500–7800)
Neutrophils Relative %: 58.4 %
Platelets: 291 10*3/uL (ref 140–400)
RBC: 4.79 10*6/uL (ref 4.20–5.80)
RDW: 12.7 % (ref 11.0–15.0)
Total Lymphocyte: 24.3 %
WBC: 7 10*3/uL (ref 3.8–10.8)

## 2019-03-01 LAB — LIPASE: Lipase: 78 U/L — ABNORMAL HIGH (ref 7–60)

## 2019-03-04 ENCOUNTER — Ambulatory Visit (HOSPITAL_COMMUNITY)
Admission: RE | Admit: 2019-03-04 | Discharge: 2019-03-04 | Disposition: A | Discharge status: Home / Self Care | Payer: BC Managed Care – PPO | Source: Ambulatory Visit | Attending: Family Medicine | Admitting: Family Medicine

## 2019-03-04 ENCOUNTER — Encounter (HOSPITAL_COMMUNITY): Payer: Self-pay

## 2019-03-04 ENCOUNTER — Other Ambulatory Visit: Payer: Self-pay

## 2019-03-04 ENCOUNTER — Other Ambulatory Visit: Payer: Self-pay | Admitting: Family Medicine

## 2019-03-04 DIAGNOSIS — R748 Abnormal levels of other serum enzymes: Secondary | ICD-10-CM

## 2019-03-04 DIAGNOSIS — R1011 Right upper quadrant pain: Secondary | ICD-10-CM

## 2019-03-04 MED ORDER — IOHEXOL 300 MG/ML  SOLN
100.0000 mL | Freq: Once | INTRAMUSCULAR | Status: AC | PRN
  Administered 2019-03-04: 100 mL via INTRAVENOUS

## 2019-03-28 ENCOUNTER — Encounter: Payer: BC Managed Care – PPO | Admitting: Family Medicine

## 2019-04-25 ENCOUNTER — Encounter: Payer: BC Managed Care – PPO | Admitting: Family Medicine

## 2019-04-28 ENCOUNTER — Other Ambulatory Visit: Payer: Self-pay

## 2019-04-29 ENCOUNTER — Encounter: Payer: Self-pay | Admitting: Family Medicine

## 2019-04-29 ENCOUNTER — Ambulatory Visit (INDEPENDENT_AMBULATORY_CARE_PROVIDER_SITE_OTHER): Payer: BC Managed Care – PPO | Admitting: Family Medicine

## 2019-04-29 VITALS — BP 144/82 | HR 78 | Temp 98.4°F | Resp 18 | Ht 71.0 in | Wt 261.0 lb

## 2019-04-29 DIAGNOSIS — Z1211 Encounter for screening for malignant neoplasm of colon: Secondary | ICD-10-CM

## 2019-04-29 DIAGNOSIS — Z0001 Encounter for general adult medical examination with abnormal findings: Secondary | ICD-10-CM

## 2019-04-29 DIAGNOSIS — R1011 Right upper quadrant pain: Secondary | ICD-10-CM | POA: Diagnosis not present

## 2019-04-29 DIAGNOSIS — Z0000 Encounter for general adult medical examination without abnormal findings: Secondary | ICD-10-CM

## 2019-04-29 DIAGNOSIS — R1310 Dysphagia, unspecified: Secondary | ICD-10-CM

## 2019-04-29 DIAGNOSIS — Z125 Encounter for screening for malignant neoplasm of prostate: Secondary | ICD-10-CM

## 2019-04-29 DIAGNOSIS — F411 Generalized anxiety disorder: Secondary | ICD-10-CM

## 2019-04-29 DIAGNOSIS — I10 Essential (primary) hypertension: Secondary | ICD-10-CM

## 2019-04-29 DIAGNOSIS — R1319 Other dysphagia: Secondary | ICD-10-CM

## 2019-04-29 NOTE — Progress Notes (Signed)
Subjective:    Patient ID: Darren Baker, male    DOB: August 03, 1971, 48 y.o.   MRN: [id number]  HPI  patient is here today for complete physical exam.  Please see my last office visit.  At that time he was having intermittent right upper quadrant abdominal pain.  I thought the patient was having biliary colic so I recommended a right upper quadrant ultrasound however also obtained labs including a CMP, lipase, and CBC.  His lipase was mildly elevated at 78.  As result the patient was sent for a CT scan of the abdomen and pelvis which revealed no pancreatitis and no evidence of choledocholithiasis or cholecystitis.  Patient states that his pain has improved since that time.  He still occasionally gets right-sided abdominal pain under his right rib.  This occurs infrequently and is very mild.  We discussed proceeding with a right upper quadrant ultrasound versus HIDA scan he elects to defer this at the present time.  He is 48 years old.  He is due for screening colonoscopy versus a Cologuard.  We discussed this and the patient would prefer a Cologuard rather than a colonoscopy.  He is also due for a PSA to screen for prostate cancer.  He is due for fasting lab work as well.  His blood pressure today is mildly elevated however the patient is under tremendous stress with his job.  He is a Water quality scientist.  Certainly there is a lot of social stress given the current protests regarding reported police brutality.  Patient states that they are understaffed at his office due to early retirement and people leaving the job.  He also reports dysphasia.  He states that food frequently sticks in his upper esophagus.  It tends to occur when he is eating meat.  When this occurs he has a difficult time breathing.  If he sips water, the food will eventually pass.  It never occurs with chewing peppermint or spices.  It tends to occur with solid meat raising the concern for a stricture versus some type of spasm in the  esophagus.  He states that it does not happen all the time.  It tends to occur randomly.  He denies any melena or hematochezia.  Past Medical History:  Diagnosis Date   DDD (degenerative disc disease), lumbar    Hypertension    Low back pain    Migraine    Obesity    Post concussion syndrome    Current Outpatient Medications on File Prior to Visit  Medication Sig Dispense Refill   clotrimazole-betamethasone (LOTRISONE) cream Apply 1 application topically 2 (two) times daily. 30 g 0   eletriptan (RELPAX) 40 MG tablet Take 40 mg by mouth as needed for migraine. One tablet by mouth at onset of headache. May repeat in 2 hours if headache persists or recurs.     HYDROcodone-acetaminophen (NORCO/VICODIN) 5-325 MG tablet Take 1 tablet by mouth every 6 (six) hours as needed for moderate pain. 90 tablet 0   linaclotide (LINZESS) 145 MCG CAPS capsule Take 1 capsule (145 mcg total) by mouth daily before breakfast. 90 capsule 3   losartan-hydrochlorothiazide (HYZAAR) 50-12.5 MG tablet TAKE 1 TABLET BY MOUTH EVERY DAY 90 tablet 3   magnesium oxide (MAG-OX) 400 (241.3 MG) MG tablet Take 800 mg by mouth daily.      meloxicam (MOBIC) 7.5 MG tablet Take 1 tablet (7.5 mg total) by mouth daily. (Patient taking differently: Take 7.5-15 mg by mouth daily. ) 30  tablet 5   tiZANidine (ZANAFLEX) 4 MG tablet Take 4-8 mg by mouth at bedtime as needed.     UBRELVY 100 MG TABS TAKE 1 TAB AT ONSET OF HEADACHE. MAY REPEAT IN 2 HOURS. MAX 2 TABS/24 HOURS     verapamil (CALAN-SR) 240 MG CR tablet Take 240 mg by mouth daily.  3   No current facility-administered medications on file prior to visit.    No Known Allergies Social History   Socioeconomic History   Marital status: Single    Spouse name: Not on file   Number of children: Not on file   Years of education: Not on file   Highest education level: Not on file  Occupational History   Not on file  Social Needs   Financial resource  strain: Not on file   Food insecurity    Worry: Not on file    Inability: Not on file   Transportation needs    Medical: Not on file    Non-medical: Not on file  Tobacco Use   Smoking status: Never Smoker   Smokeless tobacco: Never Used  Substance and Sexual Activity   Alcohol use: Yes    Comment: Rare   Drug use: No   Sexual activity: Not on file    Comment: Married, works for Building surveyor.  Lifestyle   Physical activity    Days per week: Not on file    Minutes per session: Not on file   Stress: Not on file  Relationships   Social connections    Talks on phone: Not on file    Gets together: Not on file    Attends religious service: Not on file    Active member of club or organization: Not on file    Attends meetings of clubs or organizations: Not on file    Relationship status: Not on file   Intimate partner violence    Fear of current or ex partner: Not on file    Emotionally abused: Not on file    Physically abused: Not on file    Forced sexual activity: Not on file  Other Topics Concern   Not on file  Social History Narrative   Not on file   Family History  Problem Relation Age of Onset   Diabetes Father    Heart disease Father    Hyperlipidemia Father    Hypertension Father      Review of Systems  All other systems reviewed and are negative.      Objective:   Physical Exam Vitals signs reviewed.  Constitutional:      General: He is not in acute distress.    Appearance: He is well-developed. He is not diaphoretic.  HENT:     Head: Normocephalic and atraumatic.     Right Ear: External ear normal.     Left Ear: External ear normal.     Nose: Nose normal.     Mouth/Throat:     Pharynx: No oropharyngeal exudate.  Eyes:     General: No scleral icterus.       Right eye: No discharge.        Left eye: No discharge.     Conjunctiva/sclera: Conjunctivae normal.     Pupils: Pupils are equal, round, and reactive to light.  Neck:      Musculoskeletal: Normal range of motion and neck supple.     Thyroid: No thyromegaly.     Vascular: No JVD.     Trachea: No tracheal  deviation.  Cardiovascular:     Rate and Rhythm: Normal rate and regular rhythm.     Heart sounds: Normal heart sounds. No murmur. No friction rub. No gallop.   Pulmonary:     Effort: Pulmonary effort is normal. No respiratory distress.     Breath sounds: Normal breath sounds. No stridor. No wheezing or rales.  Chest:     Chest wall: No tenderness.  Abdominal:     General: Bowel sounds are normal. There is no distension.     Palpations: Abdomen is soft. There is no mass.     Tenderness: There is no abdominal tenderness. There is no guarding or rebound.     Hernia: No hernia is present.  Musculoskeletal:        General: No tenderness or deformity.  Lymphadenopathy:     Cervical: No cervical adenopathy.  Skin:    General: Skin is warm.     Coloration: Skin is not pale.     Findings: Rash present.  Neurological:     Mental Status: He is alert and oriented to person, place, and time.     Cranial Nerves: No cranial nerve deficit.     Sensory: No sensory deficit.     Motor: No abnormal muscle tone.     Coordination: Coordination normal.           Assessment & Plan:  The primary encounter diagnosis was Benign essential HTN. Diagnoses of Prostate cancer screening, General medical exam, Right upper quadrant pain, GAD (generalized anxiety disorder), Esophageal dysphagia, and Colon cancer screening were also pertinent to this visit. Patient's physical exam today is significant for elevated weight and mildly elevated blood pressure.  He is due for a colonoscopy but after discussion the patient elects to have a Cologuard test performed.  I will schedule the patient for Cologuard and have this mailed to his home.  I will screen for prostate cancer with a PSA.  I will check a CBC, CMP, fasting lipid panel.  We discussed right upper quadrant ultrasound versus  HIDA scan for his right upper quadrant pain.  At the present time his pain is much better and he defers further work-up.  If the right upper quadrant pain returns I would recommend a right upper quadrant ultrasound to evaluate for gallstones.  I would like the patient to see GI for his dysphasia.  At times food is sticking in his upper esophagus.  He may have an esophageal stricture.  I believe the patient would benefit from an EGD.  He also reports symptoms of anxiety and daily issues with OCD behavior.  I have recommended a referral to a psychologist for counseling.  We have tried numerous SSRIs in the past without any benefit.  I believe he may benefit more from cognitive behavioral therapy.  I have encouraged the patient to schedule that appointment.

## 2019-04-30 LAB — COMPLETE METABOLIC PANEL WITH GFR
AG Ratio: 1.8 (calc) (ref 1.0–2.5)
ALT: 54 U/L — ABNORMAL HIGH (ref 9–46)
AST: 45 U/L — ABNORMAL HIGH (ref 10–35)
Albumin: 4.5 g/dL (ref 3.6–5.1)
Alkaline phosphatase (APISO): 76 U/L (ref 35–144)
BUN: 22 mg/dL (ref 7–25)
CO2: 24 mmol/L (ref 20–32)
Calcium: 9.8 mg/dL (ref 8.6–10.3)
Chloride: 104 mmol/L (ref 98–110)
Creat: 1.21 mg/dL (ref 0.70–1.33)
GFR, Est African American: 80 mL/min/{1.73_m2} (ref >=60)
GFR, Est Non African American: 69 mL/min/{1.73_m2} (ref >=60)
Globulin: 2.5 g/dL (calc) (ref 1.9–3.7)
Glucose, Bld: 87 mg/dL (ref 65–99)
Potassium: 4.8 mmol/L (ref 3.5–5.3)
Sodium: 139 mmol/L (ref 135–146)
Total Bilirubin: 0.5 mg/dL (ref 0.2–1.2)
Total Protein: 7 g/dL (ref 6.1–8.1)

## 2019-04-30 LAB — CBC WITH DIFFERENTIAL/PLATELET
Absolute Monocytes: 843 cells/uL (ref 200–950)
Basophils Absolute: 69 cells/uL (ref 0–200)
Basophils Relative: 0.8 %
Eosinophils Absolute: 335 cells/uL (ref 15–500)
Eosinophils Relative: 3.9 %
HCT: 43.3 % (ref 38.5–50.0)
Hemoglobin: 15.1 g/dL (ref 13.2–17.1)
Lymphs Abs: 1987 cells/uL (ref 850–3900)
MCH: 30.1 pg (ref 27.0–33.0)
MCHC: 34.9 g/dL (ref 32.0–36.0)
MCV: 86.4 fL (ref 80.0–100.0)
MPV: 11.3 fL (ref 7.5–12.5)
Monocytes Relative: 9.8 %
Neutro Abs: 5366 cells/uL (ref 1500–7800)
Neutrophils Relative %: 62.4 %
Platelets: 293 10*3/uL (ref 140–400)
RBC: 5.01 10*6/uL (ref 4.20–5.80)
RDW: 12.7 % (ref 11.0–15.0)
Total Lymphocyte: 23.1 %
WBC: 8.6 10*3/uL (ref 3.8–10.8)

## 2019-04-30 LAB — PSA: PSA: 0.7 ng/mL (ref <=4.0)

## 2019-04-30 LAB — LIPID PANEL
Cholesterol: 168 mg/dL (ref <200)
HDL: 55 mg/dL (ref >=40)
LDL Cholesterol (Calc): 96 mg/dL (calc)
Non-HDL Cholesterol (Calc): 113 mg/dL (calc) (ref <130)
Total CHOL/HDL Ratio: 3.1 (calc) (ref <5.0)
Triglycerides: 83 mg/dL (ref <150)

## 2019-05-01 ENCOUNTER — Other Ambulatory Visit: Payer: Self-pay | Admitting: Family Medicine

## 2019-05-01 ENCOUNTER — Telehealth: Payer: Self-pay | Admitting: *Deleted

## 2019-05-01 DIAGNOSIS — R1011 Right upper quadrant pain: Secondary | ICD-10-CM

## 2019-05-01 NOTE — Telephone Encounter (Signed)
Received verbal orders for Cologuard.   Order placed via [facility].   Cologuard (Order [id number])

## 2019-05-09 ENCOUNTER — Ambulatory Visit
Admission: RE | Admit: 2019-05-09 | Discharge: 2019-05-09 | Disposition: A | Discharge status: Home / Self Care | Payer: BC Managed Care – PPO | Source: Ambulatory Visit | Attending: Family Medicine | Admitting: Family Medicine

## 2019-05-09 DIAGNOSIS — R1011 Right upper quadrant pain: Secondary | ICD-10-CM

## 2019-06-03 ENCOUNTER — Encounter: Payer: Self-pay | Admitting: Family Medicine

## 2019-06-10 ENCOUNTER — Encounter: Payer: Self-pay | Admitting: Family Medicine

## 2019-06-19 LAB — COLOGUARD

## 2019-06-20 NOTE — Telephone Encounter (Signed)
Received the results of Cologuard screening.   Screening noted negative.   A negative result indicates a low likelihood of colorectal cancer is present. Following a negative Cologuard result, the American Cancer Society recommends a Cologuard re-screening interval of 3 years.   Letter sent.   

## 2019-09-02 ENCOUNTER — Other Ambulatory Visit: Payer: Self-pay | Admitting: Family Medicine

## 2019-09-02 DIAGNOSIS — I10 Essential (primary) hypertension: Secondary | ICD-10-CM

## 2019-09-09 ENCOUNTER — Encounter: Payer: Self-pay | Admitting: Family Medicine

## 2019-09-09 ENCOUNTER — Other Ambulatory Visit: Payer: Self-pay

## 2019-09-09 ENCOUNTER — Ambulatory Visit: Payer: BC Managed Care – PPO | Admitting: Family Medicine

## 2019-09-09 VITALS — BP 156/100 | HR 70 | Temp 96.8°F | Resp 16 | Ht 71.0 in | Wt 271.0 lb

## 2019-09-09 DIAGNOSIS — F411 Generalized anxiety disorder: Secondary | ICD-10-CM | POA: Diagnosis not present

## 2019-09-09 MED ORDER — ALPRAZOLAM 0.5 MG PO TABS: 0.5000 mg | ORAL_TABLET | Freq: Three times a day (TID) | ORAL | 0 refills | Status: DC | PRN

## 2019-09-09 MED ORDER — SERTRALINE HCL 50 MG PO TABS: 50.0000 mg | ORAL_TABLET | Freq: Every day | ORAL | 3 refills | Status: DC

## 2019-09-09 NOTE — Progress Notes (Signed)
Subjective:    Patient ID: Darren Baker, male    DOB: [birth year], 48 y.o.   MRN: [id number]  HPI Patient works with the [employer].  Please see his physical this summer.  He states that his OCD is worsening.  He gives the example of turning off the light.  He states that if he has a "bad thought" when he turns off the light, he will walk back into a room to turn the light back on so that he will think something positive when he turns it off.  He states that there are several instances similar to that occurring every day.  He also reports worsening anxiety.  This is partly due to stress at work.  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  He states that they are constantly being monitored at work.  They are fielding numerous complaints throughout the day from citizens.  Recently they put some type of tracker on all of their vehicles to record how long they take at lunch, how they are driving, how often they attend certain places.  He constantly feels like he is being micromanaged and being treated like he was a guilty person even though he tries extremely hard and works extremely hard.  The anxiety has him having more frequent migraines.  Is exacerbating his IBS and causing more frequent constipation.  It seems to be exacerbating his back pain as well.  His blood pressure is also higher today than it has been in the past.  He denies any depression but he is requesting Xanax which she has taken sparingly in the past Past Medical History:  Diagnosis Date  . DDD (degenerative disc disease), lumbar   . Hypertension   . Low back pain   . Migraine   . Obesity   . Post concussion syndrome     Current Outpatient Medications on File Prior to Visit  Medication Sig Dispense Refill  . clotrimazole-betamethasone (LOTRISONE) cream Apply 1 application topically 2 (two) times daily. 30 g 0  . eletriptan (RELPAX) 40 MG tablet Take 40 mg by  mouth as needed for migraine. One tablet by mouth at onset of headache. May repeat in 2 hours if headache persists or recurs.    Marland Kitchen HYDROcodone-acetaminophen (NORCO/VICODIN) 5-325 MG tablet Take 1 tablet by mouth every 6 (six) hours as needed for moderate pain. 90 tablet 0  . linaclotide (LINZESS) 145 MCG CAPS capsule Take 1 capsule (145 mcg total) by mouth daily before breakfast. 90 capsule 3  . losartan-hydrochlorothiazide (HYZAAR) 50-12.5 MG tablet TAKE 1 TABLET BY MOUTH EVERY DAY 90 tablet 3  . magnesium oxide (MAG-OX) 400 (241.3 MG) MG tablet Take 800 mg by mouth daily.     . meloxicam (MOBIC) 7.5 MG tablet Take 1 tablet (7.5 mg total) by mouth daily. (Patient taking differently: Take 7.5-15 mg by mouth daily. ) 30 tablet 5  . tiZANidine (ZANAFLEX) 4 MG tablet Take 4-8 mg by mouth at bedtime as needed.    . UBRELVY 100 MG TABS TAKE 1 TAB AT ONSET OF HEADACHE. MAY REPEAT IN 2 HOURS. MAX 2 TABS/24 HOURS    . verapamil (CALAN-SR) 240 MG CR tablet Take 240 mg by mouth daily.  3   No current facility-administered medications on file prior to visit.    No Known Allergies Social History   Socioeconomic History  . Marital status: Single    Spouse name: Not on file  .  Number of children: Not on file  . Years of education: Not on file  . Highest education level: Not on file  Occupational History  . Not on file  Social Needs  . Financial resource strain: Not on file  . Food insecurity    Worry: Not on file    Inability: Not on file  . Transportation needs    Medical: Not on file    Non-medical: Not on file  Tobacco Use  . Smoking status: Never Smoker  . Smokeless tobacco: Never Used  Substance and Sexual Activity  . Alcohol use: Yes    Comment: Rare  . Drug use: No  . Sexual activity: Not on file    Comment: Married, works for [employer].  Lifestyle  . Physical activity    Days per week: Not on file    Minutes per session: Not on file  . Stress: Not on file  Relationships   . Social Herbalist on phone: Not on file    Gets together: Not on file    Attends religious service: Not on file    Active member of club or organization: Not on file    Attends meetings of clubs or organizations: Not on file    Relationship status: Not on file  . Intimate partner violence    Fear of current or ex partner: Not on file    Emotionally abused: Not on file    Physically abused: Not on file    Forced sexual activity: Not on file  Other Topics Concern  . Not on file  Social History Narrative  . Not on file      Review of Systems  All other systems reviewed and are negative.      Objective:   Physical Exam Vitals signs reviewed.  Constitutional:      General: He is not in acute distress.    Appearance: He is obese. He is not ill-appearing or toxic-appearing.  Cardiovascular:     Rate and Rhythm: Normal rate and regular rhythm.  Pulmonary:     Effort: Pulmonary effort is normal.     Breath sounds: Normal breath sounds.  Abdominal:     General: Bowel sounds are normal. There is no distension.     Palpations: Abdomen is soft.     Tenderness: There is no abdominal tenderness. There is no guarding.  Neurological:     Mental Status: He is alert.           Assessment & Plan:  GAD (generalized anxiety disorder)  I believe that his anxiety is contributing to several of his physical ailments including his migraines, elevated blood pressure, and his worsening IBS.  I recommend starting Zoloft 50 mg p.o. nightly and also using Xanax 0.5 mg every 8 hours as needed for panic attacks and anxiety sparingly.  Reassess via telephone in 4 to 6 weeks and consider increasing the dose of Zoloft if necessary.

## 2019-10-01 ENCOUNTER — Other Ambulatory Visit: Payer: Self-pay | Admitting: Family Medicine

## 2019-12-09 ENCOUNTER — Ambulatory Visit: Payer: BC Managed Care – PPO | Admitting: Family Medicine

## 2019-12-09 ENCOUNTER — Other Ambulatory Visit: Payer: Self-pay

## 2019-12-09 VITALS — BP 120/90 | HR 81 | Temp 97.6°F | Resp 18 | Ht 71.0 in | Wt 287.0 lb

## 2019-12-09 DIAGNOSIS — F411 Generalized anxiety disorder: Secondary | ICD-10-CM | POA: Diagnosis not present

## 2019-12-09 MED ORDER — ESCITALOPRAM OXALATE 10 MG PO TABS: 10.0000 mg | ORAL_TABLET | Freq: Every day | ORAL | 3 refills | Status: DC

## 2019-12-09 MED ORDER — TRULANCE 3 MG PO TABS: 3.0000 mg | ORAL_TABLET | Freq: Every day | ORAL | 2 refills | Status: DC

## 2019-12-09 NOTE — Progress Notes (Signed)
Subjective:    Patient ID: Darren Baker, male    DOB: 07-25-1971, 49 y.o.   MRN: [id number]  HPI  12/20 Patient works with the [employer].  Please see his physical this summer.  He states that his OCD is worsening.  He gives the example of turning off the light.  He states that if he has a "bad thought" when he turns off the light, he will walk back into a room to turn the light back on so that he will think something positive when he turns it off.  He states that there are several instances similar to that occurring every day.  He also reports worsening anxiety.  This is partly due to stress at work.  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  He states that they are constantly being monitored at work.  They are fielding numerous complaints throughout the day from citizens.  Recently they put some type of tracker on all of their vehicles to record how long they take at lunch, how they are driving, how often they attend certain places.  He constantly feels like he is being micromanaged and being treated like he was a guilty person even though he tries extremely hard and works extremely hard.  The anxiety has him having more frequent migraines.  Is exacerbating his IBS and causing more frequent constipation.  It seems to be exacerbating his back pain as well.  His blood pressure is also higher today than it has been in the past.  He denies any depression but he is requesting Xanax which she has taken sparingly in the past.  At that time, my plan was: I believe that his anxiety is contributing to several of his physical ailments including his migraines, elevated blood pressure, and his worsening IBS.  I recommend starting Zoloft 50 mg p.o. nightly and also using Xanax 0.5 mg every 8 hours as needed for panic attacks and anxiety sparingly.  Reassess via telephone in 4 to 6 weeks and consider increasing the dose of Zoloft if  necessary.  12/09/19 Patient has not seen significant benefit since switching to Zoloft.  He does not feel that the medication is helping.  He is using Xanax 1-2 times a week.  He takes it sparingly and never takes it while working.  He will take it occasionally on weekends and occasionally at night.  When he does take the medication it helps him relax but he is concerned about becoming addicted to the medication.  He also uses hydrocodone for back pain.  90 tablets to the last 6 months.  He knows not to combine the Xanax and the hydrocodone.  Previously tried Lexapro and he stopped the medication due to blurry vision and difficulty with accommodation.  However he does not like the Zoloft because he feels that the medication makes him sleepy during the daytime.  He is interested in potentially switching back to the Lexapro.  He also would like to try switching his Linzess to Trulance as he believes that this medication works better with less diarrhea.  Past Medical History:  Diagnosis Date  . DDD (degenerative disc disease), lumbar   . Hypertension   . Low back pain   . Migraine   . Obesity   . Post concussion syndrome     Current Outpatient Medications on File Prior to Visit  Medication Sig Dispense Refill  . ALPRAZolam (XANAX) 0.5 MG tablet Take 1 tablet (  0.5 mg total) by mouth 3 (three) times daily as needed for anxiety. 30 tablet 0  . clotrimazole-betamethasone (LOTRISONE) cream Apply 1 application topically 2 (two) times daily. 30 g 0  . eletriptan (RELPAX) 40 MG tablet Take 40 mg by mouth as needed for migraine. One tablet by mouth at onset of headache. May repeat in 2 hours if headache persists or recurs.    Marland Kitchen HYDROcodone-acetaminophen (NORCO/VICODIN) 5-325 MG tablet Take 1 tablet by mouth every 6 (six) hours as needed for moderate pain. 90 tablet 0  . linaclotide (LINZESS) 145 MCG CAPS capsule Take 1 capsule (145 mcg total) by mouth daily before breakfast. 90 capsule 3  .  losartan-hydrochlorothiazide (HYZAAR) 50-12.5 MG tablet TAKE 1 TABLET BY MOUTH EVERY DAY 90 tablet 3  . magnesium oxide (MAG-OX) 400 (241.3 MG) MG tablet Take 800 mg by mouth daily.     . meloxicam (MOBIC) 7.5 MG tablet Take 1 tablet (7.5 mg total) by mouth daily. (Patient taking differently: Take 7.5-15 mg by mouth daily. ) 30 tablet 5  . sertraline (ZOLOFT) 50 MG tablet TAKE 1 TABLET BY MOUTH EVERY DAY 90 tablet 2  . tiZANidine (ZANAFLEX) 4 MG tablet Take 4-8 mg by mouth at bedtime as needed.    . UBRELVY 100 MG TABS TAKE 1 TAB AT ONSET OF HEADACHE. MAY REPEAT IN 2 HOURS. MAX 2 TABS/24 HOURS    . verapamil (CALAN-SR) 240 MG CR tablet Take 240 mg by mouth daily.  3   No current facility-administered medications on file prior to visit.   No Known Allergies Social History   Socioeconomic History  . Marital status: Single    Spouse name: Not on file  . Number of children: Not on file  . Years of education: Not on file  . Highest education level: Not on file  Occupational History  . Not on file  Tobacco Use  . Smoking status: Never Smoker  . Smokeless tobacco: Never Used  Substance and Sexual Activity  . Alcohol use: Yes    Comment: Rare  . Drug use: No  . Sexual activity: Not on file    Comment: Married, works for [employer].  Other Topics Concern  . Not on file  Social History Narrative  . Not on file   Social Determinants of Health   Financial Resource Strain:   . Difficulty of Paying Living Expenses: Not on file  Food Insecurity:   . Worried About Charity fundraiser in the Last Year: Not on file  . Ran Out of Food in the Last Year: Not on file  Transportation Needs:   . Lack of Transportation (Medical): Not on file  . Lack of Transportation (Non-Medical): Not on file  Physical Activity:   . Days of Exercise per Week: Not on file  . Minutes of Exercise per Session: Not on file  Stress:   . Feeling of Stress : Not on file  Social Connections:   . Frequency of  Communication with Friends and Family: Not on file  . Frequency of Social Gatherings with Friends and Family: Not on file  . Attends Religious Services: Not on file  . Active Member of Clubs or Organizations: Not on file  . Attends Archivist Meetings: Not on file  . Marital Status: Not on file  Intimate Partner Violence:   . Fear of Current or Ex-Partner: Not on file  . Emotionally Abused: Not on file  . Physically Abused: Not on file  .  Sexually Abused: Not on file      Review of Systems  All other systems reviewed and are negative.      Objective:   Physical Exam Vitals reviewed.  Constitutional:      General: He is not in acute distress.    Appearance: He is obese. He is not ill-appearing or toxic-appearing.  Cardiovascular:     Rate and Rhythm: Normal rate and regular rhythm.  Pulmonary:     Effort: Pulmonary effort is normal.     Breath sounds: Normal breath sounds.  Abdominal:     General: Bowel sounds are normal. There is no distension.     Palpations: Abdomen is soft.     Tenderness: There is no abdominal tenderness. There is no guarding.  Neurological:     Mental Status: He is alert.           Assessment & Plan:  GAD (generalized anxiety disorder)  I have asked the patient to try to limit his use of Xanax to 2 or 3 pills a week.  Therefore 30 tablets should easily last 2 months.  I will gladly refill his Xanax if it lasts that long.  I cautioned him not to use the medication while working.  Also cautioned him not to mix medication with hydrocodone.  We will discontinue Zoloft and replaced with Lexapro 10 mg a day and reassess in 4 weeks.

## 2019-12-31 ENCOUNTER — Other Ambulatory Visit: Payer: Self-pay | Admitting: Family Medicine

## 2020-01-06 ENCOUNTER — Encounter: Payer: Self-pay | Admitting: Family Medicine

## 2020-01-06 ENCOUNTER — Other Ambulatory Visit: Payer: Self-pay

## 2020-01-06 ENCOUNTER — Ambulatory Visit (INDEPENDENT_AMBULATORY_CARE_PROVIDER_SITE_OTHER): Payer: BC Managed Care – PPO | Admitting: Family Medicine

## 2020-01-06 VITALS — BP 146/84 | HR 70 | Temp 97.2°F | Resp 16 | Ht 71.0 in | Wt 288.0 lb

## 2020-01-06 DIAGNOSIS — F411 Generalized anxiety disorder: Secondary | ICD-10-CM | POA: Diagnosis not present

## 2020-01-06 MED ORDER — ALPRAZOLAM 0.5 MG PO TABS: 0.5000 mg | ORAL_TABLET | Freq: Three times a day (TID) | ORAL | 0 refills | Status: DC | PRN
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
## 2020-01-14 ENCOUNTER — Telehealth: Payer: Self-pay | Admitting: Family Medicine

## 2020-01-14 NOTE — Telephone Encounter (Signed)
Pt called and states that he is doing fine on the Zoloft and you were going to increase it in a week if he was not having any side effects and he is not.

## 2020-01-15 MED ORDER — SERTRALINE HCL 100 MG PO TABS: 100.0000 mg | ORAL_TABLET | Freq: Every day | ORAL | 3 refills | Status: DC

## 2020-01-15 NOTE — Telephone Encounter (Signed)
Medication called/sent to requested pharmacy  

## 2020-01-15 NOTE — Telephone Encounter (Signed)
Up to 100 mg poqhs.

## 2020-01-20 ENCOUNTER — Telehealth: Payer: Self-pay | Admitting: Family Medicine

## 2020-01-20 NOTE — Telephone Encounter (Signed)
Patient contacted office and states he would like to know how he can come off of zoloft. He states that he doesn't see the benefit of using it.   CB# [phone]

## 2020-01-22 NOTE — Telephone Encounter (Signed)
Call placed to patient and patient made aware.   States that he would prefer to taper off on his own.   Will call back if he would like to proceed with referral.

## 2020-01-22 NOTE — Telephone Encounter (Signed)
Prior to stopping it, I would really like him to see a psychologist for anxiety and OCD.  Stopping would involve decreasing to 50 mg daily for 2 weeks then 50 mg every other day for 2 weeks then stop.

## 2020-02-20 ENCOUNTER — Ambulatory Visit: Payer: BC Managed Care – PPO | Admitting: Family Medicine

## 2020-02-20 ENCOUNTER — Encounter: Payer: Self-pay | Admitting: Family Medicine

## 2020-02-20 ENCOUNTER — Other Ambulatory Visit: Payer: Self-pay

## 2020-02-20 VITALS — BP 136/78 | HR 80 | Temp 97.6°F | Resp 18 | Ht 71.0 in | Wt 291.0 lb

## 2020-02-20 DIAGNOSIS — R635 Abnormal weight gain: Secondary | ICD-10-CM | POA: Diagnosis not present

## 2020-02-20 DIAGNOSIS — R42 Dizziness and giddiness: Secondary | ICD-10-CM

## 2020-02-20 MED ORDER — MECLIZINE HCL 25 MG PO TABS: 25.0000 mg | ORAL_TABLET | Freq: Three times a day (TID) | ORAL | 0 refills | Status: DC | PRN

## 2020-02-20 NOTE — Progress Notes (Signed)
Subjective:    Patient ID: Darren Baker, male    DOB: [birth year], 49 y.o.   MRN: [id number]  HPI  Patient weaned himself off Zoloft.  He decreased from 100 mg to 50 mg and then to 25 mg and has not taking any Zoloft in over 2 weeks.  About 3 weeks ago he developed dizziness.  This tends to occur in the afternoon.  He describes the dizziness as the room spinning or moving around him.  He states that if he turns his head quickly, the room will feel like it starting to spin slowly around him.  This tends to occur later in the afternoon.  Is been going on for the last 2 weeks.  Initially he attributed to stopping his Zoloft however the symptoms have not improved with time prompting him to be seen today.  He denies any neurologic deficits.  He denies any weakness or numbness in any of his extremities.  He does have chronic low back pain and history of migraines.  He also reports trouble focusing.  Over the last few weeks he also reports a difficult time focusing his vision.  Some of his symptoms could certainly be due to withdrawal from his SSRI however the patient weaned himself down gradually and therefore I do not believe the dizziness is solely due to that.  He denies any hearing loss.  He denies any tinnitus.  He denies any nausea or vomiting.  He denies any syncope or near syncope or palpitations. Past Medical History:  Diagnosis Date  . DDD (degenerative disc disease), lumbar   . Hypertension   . Low back pain   . Migraine   . Obesity   . Post concussion syndrome     Current Outpatient Medications on File Prior to Visit  Medication Sig Dispense Refill  . ALPRAZolam (XANAX) 0.5 MG tablet Take 1 tablet (0.5 mg total) by mouth 3 (three) times daily as needed for anxiety. 30 tablet 0  . clotrimazole-betamethasone (LOTRISONE) cream Apply 1 application topically 2 (two) times daily. 30 g 0  . eletriptan (RELPAX) 40 MG tablet Take 40 mg by mouth as needed for migraine. One tablet by mouth at  onset of headache. May repeat in 2 hours if headache persists or recurs.    Marland Kitchen HYDROcodone-acetaminophen (NORCO/VICODIN) 5-325 MG tablet Take 1 tablet by mouth every 6 (six) hours as needed for moderate pain. 90 tablet 0  . losartan-hydrochlorothiazide (HYZAAR) 50-12.5 MG tablet TAKE 1 TABLET BY MOUTH EVERY DAY 90 tablet 3  . magnesium oxide (MAG-OX) 400 (241.3 MG) MG tablet Take 800 mg by mouth daily.     . meloxicam (MOBIC) 7.5 MG tablet Take 1 tablet (7.5 mg total) by mouth daily. (Patient taking differently: Take 7.5-15 mg by mouth daily. ) 30 tablet 5  . tiZANidine (ZANAFLEX) 4 MG tablet Take 4-8 mg by mouth at bedtime as needed.    . UBRELVY 100 MG TABS TAKE 1 TAB AT ONSET OF HEADACHE. MAY REPEAT IN 2 HOURS. MAX 2 TABS/24 HOURS    . verapamil (CALAN-SR) 240 MG CR tablet Take 240 mg by mouth daily.  3   No current facility-administered medications on file prior to visit.   No Known Allergies Social History   Socioeconomic History  . Marital status: Single    Spouse name: Not on file  . Number of children: Not on file  . Years of education: Not on file  . Highest education level: Not on file  Occupational  History  . Not on file  Tobacco Use  . Smoking status: Never Smoker  . Smokeless tobacco: Never Used  Substance and Sexual Activity  . Alcohol use: Yes    Comment: Rare  . Drug use: No  . Sexual activity: Not on file    Comment: Married, works for [employer].  Other Topics Concern  . Not on file  Social History Narrative  . Not on file   Social Determinants of Health   Financial Resource Strain:   . Difficulty of Paying Living Expenses:   Food Insecurity:   . Worried About Charity fundraiser in the Last Year:   . Arboriculturist in the Last Year:   Transportation Needs:   . Film/video editor (Medical):   Marland Kitchen Lack of Transportation (Non-Medical):   Physical Activity:   . Days of Exercise per Week:   . Minutes of Exercise per Session:   Stress:   . Feeling  of Stress :   Social Connections:   . Frequency of Communication with Friends and Family:   . Frequency of Social Gatherings with Friends and Family:   . Attends Religious Services:   . Active Member of Clubs or Organizations:   . Attends Archivist Meetings:   Marland Kitchen Marital Status:   Intimate Partner Violence:   . Fear of Current or Ex-Partner:   . Emotionally Abused:   Marland Kitchen Physically Abused:   . Sexually Abused:       Review of Systems  Neurological: Positive for dizziness.  All other systems reviewed and are negative.      Objective:   Physical Exam Vitals reviewed.  Constitutional:      General: He is not in acute distress.    Appearance: He is obese. He is not ill-appearing, toxic-appearing or diaphoretic.  HENT:     Head: Normocephalic and atraumatic.     Right Ear: Tympanic membrane and ear canal normal.     Left Ear: Tympanic membrane and ear canal normal.     Nose: Nose normal. No congestion or rhinorrhea.  Cardiovascular:     Rate and Rhythm: Normal rate and regular rhythm.     Heart sounds: Normal heart sounds. No murmur. No friction rub. No gallop.   Pulmonary:     Effort: Pulmonary effort is normal. No respiratory distress.     Breath sounds: Normal breath sounds. No stridor. No wheezing or rhonchi.  Abdominal:     General: Bowel sounds are normal. There is no distension.     Palpations: Abdomen is soft.     Tenderness: There is no abdominal tenderness. There is no guarding.  Musculoskeletal:     Cervical back: Normal range of motion and neck supple.  Lymphadenopathy:     Cervical: No cervical adenopathy.  Neurological:     General: No focal deficit present.     Mental Status: He is alert and oriented to person, place, and time. Mental status is at baseline.     Cranial Nerves: No cranial nerve deficit.     Sensory: No sensory deficit.     Motor: No weakness.     Coordination: Coordination normal.     Gait: Gait normal.     Deep Tendon  Reflexes: Reflexes normal.           Assessment & Plan:  Weight gain - Plan: CBC with Differential/Platelet, COMPLETE METABOLIC PANEL WITH GFR, Hemoglobin A1c  Dysequilibrium  Patient's physical exam today is completely normal.  I believe some of his symptoms are due in part to vertigo.  I recommended meclizine 25 mg every 8 hours as needed for vertigo.  Some of his symptoms such as the lightheadedness and even some of the difficulty with accommodation regarding his vision could possibly be due to SSRI withdrawal.  However I would anticipate that those symptoms will gradually improve with time.  Therefore I recommended that we treat vertigo over the next 7 days and see what symptoms persist and what symptoms resolve.  Patient has gained substantial weight which he attributes to stress eating over the last several months as he has been caring for his family particular his mother and father.  He is concerned that some of his symptoms could potentially be due to diabetes and therefore he would like to check a CBC, CMP along with an A1c.

## 2020-02-21 LAB — CBC WITH DIFFERENTIAL/PLATELET
Absolute Monocytes: 602 cells/uL (ref 200–950)
Basophils Absolute: 70 cells/uL (ref 0–200)
Basophils Relative: 1 %
Eosinophils Absolute: 385 cells/uL (ref 15–500)
Eosinophils Relative: 5.5 %
HCT: 42.6 % (ref 38.5–50.0)
Hemoglobin: 14.4 g/dL (ref 13.2–17.1)
Lymphs Abs: 1638 cells/uL (ref 850–3900)
MCH: 29.3 pg (ref 27.0–33.0)
MCHC: 33.8 g/dL (ref 32.0–36.0)
MCV: 86.8 fL (ref 80.0–100.0)
MPV: 10.7 fL (ref 7.5–12.5)
Monocytes Relative: 8.6 %
Neutro Abs: 4305 cells/uL (ref 1500–7800)
Neutrophils Relative %: 61.5 %
Platelets: 278 10*3/uL (ref 140–400)
RBC: 4.91 10*6/uL (ref 4.20–5.80)
RDW: 13 % (ref 11.0–15.0)
Total Lymphocyte: 23.4 %
WBC: 7 10*3/uL (ref 3.8–10.8)

## 2020-02-21 LAB — HEMOGLOBIN A1C
Hgb A1c MFr Bld: 5.5 % of total Hgb (ref <5.7)
Mean Plasma Glucose: 111 (calc)
eAG (mmol/L): 6.2 (calc)

## 2020-02-21 LAB — COMPLETE METABOLIC PANEL WITH GFR
AG Ratio: 2.1 (calc) (ref 1.0–2.5)
ALT: 24 U/L (ref 9–46)
AST: 20 U/L (ref 10–35)
Albumin: 4.1 g/dL (ref 3.6–5.1)
Alkaline phosphatase (APISO): 87 U/L (ref 35–144)
BUN: 14 mg/dL (ref 7–25)
CO2: 23 mmol/L (ref 20–32)
Calcium: 9.4 mg/dL (ref 8.6–10.3)
Chloride: 106 mmol/L (ref 98–110)
Creat: 1.1 mg/dL (ref 0.70–1.33)
GFR, Est African American: 90 mL/min/{1.73_m2} (ref >=60)
GFR, Est Non African American: 77 mL/min/{1.73_m2} (ref >=60)
Globulin: 2 g/dL (calc) (ref 1.9–3.7)
Glucose, Bld: 98 mg/dL (ref 65–99)
Potassium: 4.4 mmol/L (ref 3.5–5.3)
Sodium: 139 mmol/L (ref 135–146)
Total Bilirubin: 0.4 mg/dL (ref 0.2–1.2)
Total Protein: 6.1 g/dL (ref 6.1–8.1)

## 2020-03-04 ENCOUNTER — Ambulatory Visit: Payer: BC Managed Care – PPO | Admitting: Family Medicine

## 2020-03-04 ENCOUNTER — Other Ambulatory Visit: Payer: Self-pay

## 2020-03-04 VITALS — BP 128/70 | HR 76 | Temp 96.0°F | Ht 71.0 in | Wt 289.0 lb

## 2020-03-04 DIAGNOSIS — R42 Dizziness and giddiness: Secondary | ICD-10-CM

## 2020-03-04 MED ORDER — FLUTICASONE PROPIONATE 50 MCG/ACT NA SUSP: 2.0000 | Freq: Every day | NASAL | 6 refills | Status: DC

## 2020-03-04 NOTE — Progress Notes (Signed)
Subjective:    Patient ID: Darren Baker, male    DOB: Dec 27, 1970, 49 y.o.   MRN: [id number]  Dizziness   02/20/20 Patient weaned himself off Zoloft.  He decreased from 100 mg to 50 mg and then to 25 mg and has not taking any Zoloft in over 2 weeks.  About 3 weeks ago he developed dizziness.  This tends to occur in the afternoon.  He describes the dizziness as the room spinning or moving around him.  He states that if he turns his head quickly, the room will feel like it starting to spin slowly around him.  This tends to occur later in the afternoon.  Is been going on for the last 2 weeks.  Initially he attributed to stopping his Zoloft however the symptoms have not improved with time prompting him to be seen today.  He denies any neurologic deficits.  He denies any weakness or numbness in any of his extremities.  He does have chronic low back pain and history of migraines.  He also reports trouble focusing.  Over the last few weeks he also reports a difficult time focusing his vision.  Some of his symptoms could certainly be due to withdrawal from his SSRI however the patient weaned himself down gradually and therefore I do not believe the dizziness is solely due to that.  He denies any hearing loss.  He denies any tinnitus.  He denies any nausea or vomiting.  He denies any syncope or near syncope or palpitations.  At that time, my plan was: Patient's physical exam today is completely normal.  I believe some of his symptoms are due in part to vertigo.  I recommended meclizine 25 mg every 8 hours as needed for vertigo.  Some of his symptoms such as the lightheadedness and even some of the difficulty with accommodation regarding his vision could possibly be due to SSRI withdrawal.  However I would anticipate that those symptoms will gradually improve with time.  Therefore I recommended that we treat vertigo over the next 7 days and see what symptoms persist and what symptoms resolve.  Patient has gained  substantial weight which he attributes to stress eating over the last several months as he has been caring for his family particular his mother and father.  He is concerned that some of his symptoms could potentially be due to diabetes and therefore he would like to check a CBC, CMP along with an A1c.  03/04/20 Patient continues to report disequilibrium.  There are no specific triggers.  There are no exacerbating factors.  There are no alleviating factors however he has gradually improving.  Recently had to go to an urgent care because of a ruptured blood vessel causing a subconjunctival hemorrhage in his left eye.  This has resolved completely.  He also reports recently having food poisoning last week.  This was complicated by nausea and vomiting.  This is gradually improved since that time he reports queasiness as well as yellow runny stool.  However I am taken back today via his anxiety.  Patient seems to be under more stress.  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  He also is under a lot of stress caring for his mother and his father.  His father's dementia is progressing.  The medications I have given him in the past to help with anxiety and OCD have not helped.  I strongly believe the  patient will benefit from cognitive behavioral therapy.    Past Medical History:  Diagnosis Date  . DDD (degenerative disc disease), lumbar   . Hypertension   . Low back pain   . Migraine   . Obesity   . Post concussion syndrome     Current Outpatient Medications on File Prior to Visit  Medication Sig Dispense Refill  . ALPRAZolam (XANAX) 0.5 MG tablet Take 1 tablet (0.5 mg total) by mouth 3 (three) times daily as needed for anxiety. 30 tablet 0  . clotrimazole-betamethasone (LOTRISONE) cream Apply 1 application topically 2 (two) times daily. 30 g 0  . eletriptan (RELPAX) 40 MG tablet Take 40 mg by mouth as needed for migraine. One tablet by  mouth at onset of headache. May repeat in 2 hours if headache persists or recurs.    Marland Kitchen HYDROcodone-acetaminophen (NORCO/VICODIN) 5-325 MG tablet Take 1 tablet by mouth every 6 (six) hours as needed for moderate pain. 90 tablet 0  . losartan-hydrochlorothiazide (HYZAAR) 50-12.5 MG tablet TAKE 1 TABLET BY MOUTH EVERY DAY 90 tablet 3  . magnesium oxide (MAG-OX) 400 (241.3 MG) MG tablet Take 800 mg by mouth daily.     . meclizine (ANTIVERT) 25 MG tablet Take 1 tablet (25 mg total) by mouth 3 (three) times daily as needed for dizziness. 30 tablet 0  . meloxicam (MOBIC) 7.5 MG tablet Take 1 tablet (7.5 mg total) by mouth daily. (Patient taking differently: Take 7.5-15 mg by mouth daily. ) 30 tablet 5  . tiZANidine (ZANAFLEX) 4 MG tablet Take 4-8 mg by mouth at bedtime as needed.    . UBRELVY 100 MG TABS TAKE 1 TAB AT ONSET OF HEADACHE. MAY REPEAT IN 2 HOURS. MAX 2 TABS/24 HOURS    . verapamil (CALAN-SR) 240 MG CR tablet Take 240 mg by mouth daily.  3   No current facility-administered medications on file prior to visit.   No Known Allergies Social History   Socioeconomic History  . Marital status: Single    Spouse name: Not on file  . Number of children: Not on file  . Years of education: Not on file  . Highest education level: Not on file  Occupational History  . Not on file  Tobacco Use  . Smoking status: Never Smoker  . Smokeless tobacco: Never Used  Substance and Sexual Activity  . Alcohol use: Yes    Comment: Rare  . Drug use: No  . Sexual activity: Not on file    Comment: Married, works for [employer].  Other Topics Concern  . Not on file  Social History Narrative  . Not on file   Social Determinants of Health   Financial Resource Strain:   . Difficulty of Paying Living Expenses:   Food Insecurity:   . Worried About Charity fundraiser in the Last Year:   . Arboriculturist in the Last Year:   Transportation Needs:   . Film/video editor (Medical):   Marland Kitchen Lack of  Transportation (Non-Medical):   Physical Activity:   . Days of Exercise per Week:   . Minutes of Exercise per Session:   Stress:   . Feeling of Stress :   Social Connections:   . Frequency of Communication with Friends and Family:   . Frequency of Social Gatherings with Friends and Family:   . Attends Religious Services:   . Active Member of Clubs or Organizations:   . Attends Archivist Meetings:   Marland Kitchen Marital  Status:   Intimate Partner Violence:   . Fear of Current or Ex-Partner:   . Emotionally Abused:   Marland Kitchen Physically Abused:   . Sexually Abused:       Review of Systems  Neurological: Positive for dizziness.  All other systems reviewed and are negative.      Objective:   Physical Exam Vitals reviewed.  Constitutional:      General: He is not in acute distress.    Appearance: He is obese. He is not ill-appearing, toxic-appearing or diaphoretic.  HENT:     Head: Normocephalic and atraumatic.     Right Ear: Tympanic membrane and ear canal normal.     Left Ear: Tympanic membrane and ear canal normal.     Nose: Nose normal. No congestion or rhinorrhea.  Cardiovascular:     Rate and Rhythm: Normal rate and regular rhythm.     Heart sounds: Normal heart sounds. No murmur. No friction rub. No gallop.   Pulmonary:     Effort: Pulmonary effort is normal. No respiratory distress.     Breath sounds: Normal breath sounds. No stridor. No wheezing or rhonchi.  Abdominal:     General: Bowel sounds are normal. There is no distension.     Palpations: Abdomen is soft.     Tenderness: There is no abdominal tenderness. There is no guarding.  Musculoskeletal:     Cervical back: Normal range of motion and neck supple.  Lymphadenopathy:     Cervical: No cervical adenopathy.  Neurological:     General: No focal deficit present.     Mental Status: He is alert and oriented to person, place, and time. Mental status is at baseline.     Cranial Nerves: No cranial nerve deficit.      Sensory: No sensory deficit.     Motor: No weakness.     Coordination: Coordination normal.     Gait: Gait normal.     Deep Tendon Reflexes: Reflexes normal.           Assessment & Plan:  Dysequilibrium Patient's exam today is normal except for right septal deviation and edema in the nasal mucosa due to his allergy.  I recommended trying Flonase for allergic rhinitis and sinus congestion which may be contributing to his disequilibrium however I believe the majority of his symptoms are related to his anxiety and stress.  I have strongly recommended that he meet with a psychologist for counseling.  I provided him the contact information for [facility].  I would like him to get a second opinion with his group to try to help better manage stress.

## 2020-03-04 NOTE — Progress Notes (Signed)
Medication Samples have been provided to the patient.  Drug name: Linzess        Strength: 145 MCG       Qty: 8  LOT: [id number]  Exp.Date: 06/01/2020  Dosing instructions: Take 1 tablet by mouth daily   The patient has been instructed regarding the correct time, dose, and frequency of taking this medication, including desired effects and most common side effects.   Darren Baker 10:37 AM 03/04/2020

## 2020-03-26 ENCOUNTER — Ambulatory Visit: Payer: BC Managed Care – PPO | Admitting: Family Medicine

## 2020-03-26 ENCOUNTER — Other Ambulatory Visit: Payer: Self-pay

## 2020-03-26 VITALS — BP 140/70 | HR 72 | Temp 96.4°F | Wt 283.0 lb

## 2020-03-26 DIAGNOSIS — F411 Generalized anxiety disorder: Secondary | ICD-10-CM

## 2020-03-26 MED ORDER — ALPRAZOLAM 0.5 MG PO TABS: 0.5000 mg | ORAL_TABLET | Freq: Three times a day (TID) | ORAL | 0 refills | Status: DC | PRN

## 2020-03-26 NOTE — Progress Notes (Signed)
Subjective:    Patient ID: Darren Baker, male    DOB: Dec 27, 1970, 49 y.o.   MRN: [id number]  Dizziness   02/20/20 Patient weaned himself off Zoloft.  He decreased from 100 mg to 50 mg and then to 25 mg and has not taking any Zoloft in over 2 weeks.  About 3 weeks ago he developed dizziness.  This tends to occur in the afternoon.  He describes the dizziness as the room spinning or moving around him.  He states that if he turns his head quickly, the room will feel like it starting to spin slowly around him.  This tends to occur later in the afternoon.  Is been going on for the last 2 weeks.  Initially he attributed to stopping his Zoloft however the symptoms have not improved with time prompting him to be seen today.  He denies any neurologic deficits.  He denies any weakness or numbness in any of his extremities.  He does have chronic low back pain and history of migraines.  He also reports trouble focusing.  Over the last few weeks he also reports a difficult time focusing his vision.  Some of his symptoms could certainly be due to withdrawal from his SSRI however the patient weaned himself down gradually and therefore I do not believe the dizziness is solely due to that.  He denies any hearing loss.  He denies any tinnitus.  He denies any nausea or vomiting.  He denies any syncope or near syncope or palpitations.  At that time, my plan was: Patient's physical exam today is completely normal.  I believe some of his symptoms are due in part to vertigo.  I recommended meclizine 25 mg every 8 hours as needed for vertigo.  Some of his symptoms such as the lightheadedness and even some of the difficulty with accommodation regarding his vision could possibly be due to SSRI withdrawal.  However I would anticipate that those symptoms will gradually improve with time.  Therefore I recommended that we treat vertigo over the next 7 days and see what symptoms persist and what symptoms resolve.  Patient has gained  substantial weight which he attributes to stress eating over the last several months as he has been caring for his family particular his mother and father.  He is concerned that some of his symptoms could potentially be due to diabetes and therefore he would like to check a CBC, CMP along with an A1c.  03/04/20 Patient continues to report disequilibrium.  There are no specific triggers.  There are no exacerbating factors.  There are no alleviating factors however he has gradually improving.  Recently had to go to an urgent care because of a ruptured blood vessel causing a subconjunctival hemorrhage in his left eye.  This has resolved completely.  He also reports recently having food poisoning last week.  This was complicated by nausea and vomiting.  This is gradually improved since that time he reports queasiness as well as yellow runny stool.  However I am taken back today via his anxiety.  Patient seems to be under more stress.  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  He also is under a lot of stress caring for his mother and his father.  His father's dementia is progressing.  The medications I have given him in the past to help with anxiety and OCD have not helped.  I strongly believe the  patient will benefit from cognitive behavioral therapy.  At that time, my plan was: Patient's exam today is normal except for right septal deviation and edema in the nasal mucosa due to his allergy.  I recommended trying Flonase for allergic rhinitis and sinus congestion which may be contributing to his disequilibrium however I believe the majority of his symptoms are related to his anxiety and stress.  I have strongly recommended that he meet with a psychologist for counseling.  I provided him the contact information for [facility].  I would like him to get a second opinion with this group to try to help better manage  stress.  03/26/20 Unfortunately, since I last saw the patient, the situation has deteriorated.  His father was admitted to the hospital.  He became delirious and his dementia is worsening.  He has stopped eating and drinking.  They have transition him to hospice and he is being discharged to residential hospice.  Obviously this has the patient extremely distressed.  He is requesting a refill on his Xanax.  He uses the medication sparingly.  He takes a half of a 0.5 mg tablet once a day.  Some days he does not even take this.  However he would like to have the medication on hand as he is feeling more stressed out recently.  He also works for the [employer] and is applying for a promotion.  All of this has him more anxious recently.  He believes that 30 pills would last more than a month.  90 pills with definitely last 3 months.    Past Medical History:  Diagnosis Date  . DDD (degenerative disc disease), lumbar   . Hypertension   . Low back pain   . Migraine   . Obesity   . Post concussion syndrome     Current Outpatient Medications on File Prior to Visit  Medication Sig Dispense Refill  . ALPRAZolam (XANAX) 0.5 MG tablet Take 1 tablet (0.5 mg total) by mouth 3 (three) times daily as needed for anxiety. 30 tablet 0  . clotrimazole-betamethasone (LOTRISONE) cream Apply 1 application topically 2 (two) times daily. (Patient not taking: Reported on 03/04/2020) 30 g 0  . eletriptan (RELPAX) 40 MG tablet Take 40 mg by mouth as needed for migraine. One tablet by mouth at onset of headache. May repeat in 2 hours if headache persists or recurs.    . fluticasone (FLONASE) 50 MCG/ACT nasal spray Place 2 sprays into both nostrils daily. 16 g 6  . HYDROcodone-acetaminophen (NORCO/VICODIN) 5-325 MG tablet Take 1 tablet by mouth every 6 (six) hours as needed for moderate pain. 90 tablet 0  . losartan-hydrochlorothiazide (HYZAAR) 50-12.5 MG tablet TAKE 1 TABLET BY MOUTH EVERY DAY 90 tablet 3  . magnesium  oxide (MAG-OX) 400 (241.3 MG) MG tablet Take 800 mg by mouth daily.     . meclizine (ANTIVERT) 25 MG tablet Take 1 tablet (25 mg total) by mouth 3 (three) times daily as needed for dizziness. 30 tablet 0  . meloxicam (MOBIC) 7.5 MG tablet Take 1 tablet (7.5 mg total) by mouth daily. (Patient taking differently: Take 7.5-15 mg by mouth daily. ) 30 tablet 5  . neomycin-polymyxin b-dexamethasone (MAXITROL) 3.5-10000-0.1 SUSP     . tiZANidine (ZANAFLEX) 4 MG tablet Take 4-8 mg by mouth at bedtime as needed.    . UBRELVY 100 MG TABS TAKE 1 TAB AT ONSET OF HEADACHE. MAY REPEAT IN 2 HOURS. MAX 2 TABS/24 HOURS    .  verapamil (CALAN-SR) 240 MG CR tablet Take 240 mg by mouth daily.  3   No current facility-administered medications on file prior to visit.   No Known Allergies Social History   Socioeconomic History  . Marital status: Single    Spouse name: Not on file  . Number of children: Not on file  . Years of education: Not on file  . Highest education level: Not on file  Occupational History  . Not on file  Tobacco Use  . Smoking status: Never Smoker  . Smokeless tobacco: Never Used  Substance and Sexual Activity  . Alcohol use: Yes    Comment: Rare  . Drug use: No  . Sexual activity: Not on file    Comment: Married, works for [employer].  Other Topics Concern  . Not on file  Social History Narrative  . Not on file   Social Determinants of Health   Financial Resource Strain:   . Difficulty of Paying Living Expenses:   Food Insecurity:   . Worried About Charity fundraiser in the Last Year:   . Arboriculturist in the Last Year:   Transportation Needs:   . Film/video editor (Medical):   Marland Kitchen Lack of Transportation (Non-Medical):   Physical Activity:   . Days of Exercise per Week:   . Minutes of Exercise per Session:   Stress:   . Feeling of Stress :   Social Connections:   . Frequency of Communication with Friends and Family:   . Frequency of Social Gatherings with  Friends and Family:   . Attends Religious Services:   . Active Member of Clubs or Organizations:   . Attends Archivist Meetings:   Marland Kitchen Marital Status:   Intimate Partner Violence:   . Fear of Current or Ex-Partner:   . Emotionally Abused:   Marland Kitchen Physically Abused:   . Sexually Abused:       Review of Systems  Neurological: Positive for dizziness.  All other systems reviewed and are negative.      Objective:   Physical Exam Vitals reviewed.  Constitutional:      General: He is not in acute distress.    Appearance: He is obese. He is not ill-appearing, toxic-appearing or diaphoretic.  HENT:     Head: Normocephalic and atraumatic.     Right Ear: Tympanic membrane and ear canal normal.     Left Ear: Tympanic membrane and ear canal normal.     Nose: Nose normal. No congestion or rhinorrhea.  Cardiovascular:     Rate and Rhythm: Normal rate and regular rhythm.     Heart sounds: Normal heart sounds. No murmur heard.  No friction rub. No gallop.   Pulmonary:     Effort: Pulmonary effort is normal. No respiratory distress.     Breath sounds: Normal breath sounds. No stridor. No wheezing or rhonchi.  Abdominal:     General: Bowel sounds are normal. There is no distension.     Palpations: Abdomen is soft.     Tenderness: There is no abdominal tenderness. There is no guarding.  Musculoskeletal:     Cervical back: Normal range of motion and neck supple.  Lymphadenopathy:     Cervical: No cervical adenopathy.  Neurological:     General: No focal deficit present.     Mental Status: He is alert and oriented to person, place, and time. Mental status is at baseline.     Cranial Nerves: No cranial nerve deficit.  Sensory: No sensory deficit.     Motor: No weakness.     Coordination: Coordination normal.     Gait: Gait normal.     Deep Tendon Reflexes: Reflexes normal.           Assessment & Plan:   GAD (generalized anxiety disorder)  Spent the majority of our  visit today discussing the situation with his father.  I expressed my condolences to him and his family.  I will be glad to give him a refill on the Xanax.  I will give him 0.5 mg tablets.  He can take 1 tablet every 8 hours as needed.  I will give him 90 tablets.  Hopefully this will last 3 months.  I also cautioned the patient about habituation and dependency with this medication.

## 2020-04-12 ENCOUNTER — Other Ambulatory Visit: Payer: Self-pay | Admitting: Family Medicine

## 2020-04-16 ENCOUNTER — Other Ambulatory Visit: Payer: Self-pay | Admitting: Family Medicine

## 2020-07-09 ENCOUNTER — Telehealth: Payer: Self-pay

## 2020-07-09 NOTE — Telephone Encounter (Signed)
Spoke with Pt encouraged him to drink plenty of water, he'll call back Monday to let us know hows he feeling

## 2020-07-09 NOTE — Telephone Encounter (Signed)
Pt started vomiting and was running a fever extremely weak not feeling good has cotton mouth. Want nurse to call him back   Pt call back [phone]

## 2020-07-13 ENCOUNTER — Telehealth: Payer: Self-pay

## 2020-07-13 NOTE — Telephone Encounter (Signed)
Pt was taken out of work till Thursday but will need to follow up with primary care doctor and wants to know how long he is contagious from virus that he had. His job is wanting to know.   Pt call back [phone]

## 2020-07-15 ENCOUNTER — Encounter: Payer: Self-pay | Admitting: Family Medicine

## 2020-07-15 ENCOUNTER — Ambulatory Visit: Payer: BC Managed Care – PPO | Admitting: Family Medicine

## 2020-07-15 ENCOUNTER — Other Ambulatory Visit: Payer: Self-pay

## 2020-07-15 VITALS — BP 130/88 | HR 67 | Temp 98.1°F | Ht 71.0 in | Wt 280.0 lb

## 2020-07-15 DIAGNOSIS — A084 Viral intestinal infection, unspecified: Secondary | ICD-10-CM

## 2020-07-15 NOTE — Telephone Encounter (Signed)
Please Advise

## 2020-07-15 NOTE — Progress Notes (Signed)
Subjective:    Patient ID: Darren Baker, male    DOB: Dec 27, 1970, 49 y.o.   MRN: [id number]  Dizziness   02/20/20 Patient weaned himself off Zoloft.  He decreased from 100 mg to 50 mg and then to 25 mg and has not taking any Zoloft in over 2 weeks.  About 3 weeks ago he developed dizziness.  This tends to occur in the afternoon.  He describes the dizziness as the room spinning or moving around him.  He states that if he turns his head quickly, the room will feel like it starting to spin slowly around him.  This tends to occur later in the afternoon.  Is been going on for the last 2 weeks.  Initially he attributed to stopping his Zoloft however the symptoms have not improved with time prompting him to be seen today.  He denies any neurologic deficits.  He denies any weakness or numbness in any of his extremities.  He does have chronic low back pain and history of migraines.  He also reports trouble focusing.  Over the last few weeks he also reports a difficult time focusing his vision.  Some of his symptoms could certainly be due to withdrawal from his SSRI however the patient weaned himself down gradually and therefore I do not believe the dizziness is solely due to that.  He denies any hearing loss.  He denies any tinnitus.  He denies any nausea or vomiting.  He denies any syncope or near syncope or palpitations.  At that time, my plan was: Patient's physical exam today is completely normal.  I believe some of his symptoms are due in part to vertigo.  I recommended meclizine 25 mg every 8 hours as needed for vertigo.  Some of his symptoms such as the lightheadedness and even some of the difficulty with accommodation regarding his vision could possibly be due to SSRI withdrawal.  However I would anticipate that those symptoms will gradually improve with time.  Therefore I recommended that we treat vertigo over the next 7 days and see what symptoms persist and what symptoms resolve.  Patient has gained  substantial weight which he attributes to stress eating over the last several months as he has been caring for his family particular his mother and father.  He is concerned that some of his symptoms could potentially be due to diabetes and therefore he would like to check a CBC, CMP along with an A1c.  03/04/20 Patient continues to report disequilibrium.  There are no specific triggers.  There are no exacerbating factors.  There are no alleviating factors however he has gradually improving.  Recently had to go to an urgent care because of a ruptured blood vessel causing a subconjunctival hemorrhage in his left eye.  This has resolved completely.  He also reports recently having food poisoning last week.  This was complicated by nausea and vomiting.  This is gradually improved since that time he reports queasiness as well as yellow runny stool.  However I am taken back today via his anxiety.  Patient seems to be under more stress.  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  He also is under a lot of stress caring for his mother and his father.  His father's dementia is progressing.  The medications I have given him in the past to help with anxiety and OCD have not helped.  I strongly believe the  patient will benefit from cognitive behavioral therapy.  At that time, my plan was: Patient's exam today is normal except for right septal deviation and edema in the nasal mucosa due to his allergy.  I recommended trying Flonase for allergic rhinitis and sinus congestion which may be contributing to his disequilibrium however I believe the majority of his symptoms are related to his anxiety and stress.  I have strongly recommended that he meet with a psychologist for counseling.  I provided him the contact information for [facility].  I would like him to get a second opinion with this group to try to help better manage  stress.  03/26/20 Unfortunately, since I last saw the patient, the situation has deteriorated.  His father was admitted to the hospital.  He became delirious and his dementia is worsening.  He has stopped eating and drinking.  They have transition him to hospice and he is being discharged to residential hospice.  Obviously this has the patient extremely distressed.  He is requesting a refill on his Xanax.  He uses the medication sparingly.  He takes a half of a 0.5 mg tablet once a day.  Some days he does not even take this.  However he would like to have the medication on hand as he is feeling more stressed out recently.  He also works for the [employer] and is applying for a promotion.  All of this has him more anxious recently.  He believes that 30 pills would last more than a month.  90 pills with definitely last 3 months.  At that time, my plan was: Spent the majority of our visit today discussing the situation with his father.  I expressed my condolences to him and his family.  I will be glad to give him a refill on the Xanax.  I will give him 0.5 mg tablets.  He can take 1 tablet every 8 hours as needed.  I will give him 90 tablets.  Hopefully this will last 3 months.  I also cautioned the patient about habituation and dependency with this medication.  07/15/20  Patient symptoms began Saturday.  Symptoms included one episode of nausea and vomiting and then profound watery diarrhea with a low-grade fever to 99.5.  Patient went to [facility].  He was tested there and tested negative for Covid.  Lipase was normal.  CBC was normal with no leukocytosis.  Urinalysis was normal.  He did have some mildly low potassium most likely due to the diarrhea.  Patient states that the diarrhea still persist.  He denies any further fever or cough or chest pain or shortness of breath.  He has had no further episodes of vomiting.   Past Medical History:  Diagnosis Date  . DDD (degenerative disc disease),  lumbar   . Hypertension   . Low back pain   . Migraine   . Obesity   . Post concussion syndrome     Current Outpatient Medications on File Prior to Visit  Medication Sig Dispense Refill  . ALPRAZolam (XANAX) 0.5 MG tablet Take 1 tablet (0.5 mg total) by mouth 3 (three) times daily as needed for anxiety. 90 tablet 0  . clotrimazole-betamethasone (LOTRISONE) cream Apply 1 application topically 2 (two) times daily. 30 g 0  . eletriptan (RELPAX) 40 MG tablet Take 40 mg by mouth as needed for migraine. One tablet by mouth at onset of headache. May repeat in 2 hours if headache persists or recurs.    Marland Kitchen  fluticasone (FLONASE) 50 MCG/ACT nasal spray Place 2 sprays into both nostrils daily. 16 g 6  . HYDROcodone-acetaminophen (NORCO/VICODIN) 5-325 MG tablet Take 1 tablet by mouth every 6 (six) hours as needed for moderate pain. 90 tablet 0  . LINZESS 145 MCG CAPS capsule TAKE 1 CAPSULE BY MOUTH DAILY BEFORE BREAKFAST. 30 capsule 11  . losartan-hydrochlorothiazide (HYZAAR) 50-12.5 MG tablet TAKE 1 TABLET BY MOUTH EVERY DAY 90 tablet 3  . magnesium oxide (MAG-OX) 400 (241.3 MG) MG tablet Take 800 mg by mouth daily.     . meclizine (ANTIVERT) 25 MG tablet Take 1 tablet (25 mg total) by mouth 3 (three) times daily as needed for dizziness. 30 tablet 0  . meloxicam (MOBIC) 7.5 MG tablet Take 1 tablet (7.5 mg total) by mouth daily. (Patient taking differently: Take 7.5-15 mg by mouth daily. ) 30 tablet 5  . neomycin-polymyxin b-dexamethasone (MAXITROL) 3.5-10000-0.1 SUSP     . tiZANidine (ZANAFLEX) 4 MG tablet Take 4-8 mg by mouth at bedtime as needed.    . UBRELVY 100 MG TABS TAKE 1 TAB AT ONSET OF HEADACHE. MAY REPEAT IN 2 HOURS. MAX 2 TABS/24 HOURS    . verapamil (CALAN-SR) 240 MG CR tablet Take 240 mg by mouth daily.  3   No current facility-administered medications on file prior to visit.   No Known Allergies Social History   Socioeconomic History  . Marital status: Single    Spouse name: Not on  file  . Number of children: Not on file  . Years of education: Not on file  . Highest education level: Not on file  Occupational History  . Not on file  Tobacco Use  . Smoking status: Never Smoker  . Smokeless tobacco: Never Used  Substance and Sexual Activity  . Alcohol use: Yes    Comment: Rare  . Drug use: No  . Sexual activity: Not on file    Comment: Married, works for [employer].  Other Topics Concern  . Not on file  Social History Narrative  . Not on file   Social Determinants of Health   Financial Resource Strain:   . Difficulty of Paying Living Expenses: Not on file  Food Insecurity:   . Worried About Charity fundraiser in the Last Year: Not on file  . Ran Out of Food in the Last Year: Not on file  Transportation Needs:   . Lack of Transportation (Medical): Not on file  . Lack of Transportation (Non-Medical): Not on file  Physical Activity:   . Days of Exercise per Week: Not on file  . Minutes of Exercise per Session: Not on file  Stress:   . Feeling of Stress : Not on file  Social Connections:   . Frequency of Communication with Friends and Family: Not on file  . Frequency of Social Gatherings with Friends and Family: Not on file  . Attends Religious Services: Not on file  . Active Member of Clubs or Organizations: Not on file  . Attends Archivist Meetings: Not on file  . Marital Status: Not on file  Intimate Partner Violence:   . Fear of Current or Ex-Partner: Not on file  . Emotionally Abused: Not on file  . Physically Abused: Not on file  . Sexually Abused: Not on file      Review of Systems  Neurological: Positive for dizziness.  All other systems reviewed and are negative.      Objective:   Physical Exam Vitals reviewed.  Constitutional:      General: He is not in acute distress.    Appearance: He is obese. He is not ill-appearing, toxic-appearing or diaphoretic.  HENT:     Head: Normocephalic and atraumatic.     Right  Ear: Tympanic membrane and ear canal normal.     Left Ear: Tympanic membrane and ear canal normal.     Nose: Nose normal. No congestion or rhinorrhea.  Cardiovascular:     Rate and Rhythm: Normal rate and regular rhythm.     Heart sounds: Normal heart sounds. No murmur heard.  No friction rub. No gallop.   Pulmonary:     Effort: Pulmonary effort is normal. No respiratory distress.     Breath sounds: Normal breath sounds. No stridor. No wheezing or rhonchi.  Abdominal:     General: Bowel sounds are normal. There is no distension.     Palpations: Abdomen is soft.     Tenderness: There is no abdominal tenderness. There is no guarding.  Musculoskeletal:     Cervical back: Normal range of motion and neck supple.  Lymphadenopathy:     Cervical: No cervical adenopathy.  Neurological:     General: No focal deficit present.     Mental Status: He is alert and oriented to person, place, and time. Mental status is at baseline.     Cranial Nerves: No cranial nerve deficit.     Sensory: No sensory deficit.     Motor: No weakness.     Coordination: Coordination normal.     Gait: Gait normal.     Deep Tendon Reflexes: Reflexes normal.           Assessment & Plan:  Viral gastroenteritis   I believe the patient have viral gastroenteritis.  I recommended that he stay out of work until Monday to avoid possibly infecting his coworkers.  I recommended he push fluids.  Otherwise I recommended tincture of time.  I anticipate that this should resolve over the weekend as he would have completed a 7-day course of the virus.

## 2020-07-16 NOTE — Telephone Encounter (Signed)
This was addressed at [facility] yesterday.

## 2020-09-04 ENCOUNTER — Other Ambulatory Visit: Payer: Self-pay | Admitting: Family Medicine

## 2020-09-04 DIAGNOSIS — I10 Essential (primary) hypertension: Secondary | ICD-10-CM

## 2020-10-04 ENCOUNTER — Telehealth: Payer: Self-pay | Admitting: *Deleted

## 2020-10-04 ENCOUNTER — Other Ambulatory Visit: Payer: Self-pay | Admitting: Family Medicine

## 2020-10-04 MED ORDER — HYDROCOD POLST-CPM POLST ER 10-8 MG/5ML PO SUER: 5.0000 mL | Freq: Two times a day (BID) | ORAL | 0 refills | Status: DC | PRN

## 2020-10-04 NOTE — Telephone Encounter (Signed)
Received call from patient.   Reports that he has tested positive for COVID. Patient states that he has cough, sore throat on L side, fever, body aches.   Requested prescription for cough medication.  Message sent to [facility] for screening.   Patient is Coivd + and PCP feels they would benefit from infusion. Patient tested positive on 10/03/2020. Patient has increased risk factors D/T HTN and career in [employer]. Patient is currently experiencing cough, sore throat on L side, fever, body aches. Medium priority. Patient is fully vaccinated with booster, and patient is aware of limited supply.Thank you.

## 2020-10-04 NOTE — Telephone Encounter (Signed)
Sent in tussionex

## 2020-10-12 ENCOUNTER — Telehealth: Payer: Self-pay

## 2020-10-12 NOTE — Telephone Encounter (Signed)
Sure please draft note

## 2020-10-12 NOTE — Telephone Encounter (Signed)
Work note drafted and signed by Provider

## 2020-10-12 NOTE — Telephone Encounter (Signed)
Mr. Darren Baker received his covid results 10-03-20 he tested positive, has not had fever for 7 days, is still having mild symptoms, stuffy nose, congestion, weakness, sleepy, he is able to eat and is hydrating. He's asking if he could possible go back to work on 10-13-20 ? Mr. Darren Baker is vaccinated w/ booster.

## 2020-11-11 ENCOUNTER — Ambulatory Visit: Payer: BC Managed Care – PPO | Admitting: Dermatology

## 2020-11-11 ENCOUNTER — Other Ambulatory Visit: Payer: Self-pay

## 2020-11-11 DIAGNOSIS — D1800 Hemangioma unspecified site: Secondary | ICD-10-CM

## 2020-11-11 DIAGNOSIS — B009 Herpesviral infection, unspecified: Secondary | ICD-10-CM | POA: Diagnosis not present

## 2020-11-11 DIAGNOSIS — L409 Psoriasis, unspecified: Secondary | ICD-10-CM | POA: Diagnosis not present

## 2020-11-11 DIAGNOSIS — L814 Other melanin hyperpigmentation: Secondary | ICD-10-CM

## 2020-11-11 DIAGNOSIS — L820 Inflamed seborrheic keratosis: Secondary | ICD-10-CM

## 2020-11-11 DIAGNOSIS — L570 Actinic keratosis: Secondary | ICD-10-CM

## 2020-11-11 DIAGNOSIS — Z1283 Encounter for screening for malignant neoplasm of skin: Secondary | ICD-10-CM

## 2020-11-11 DIAGNOSIS — L578 Other skin changes due to chronic exposure to nonionizing radiation: Secondary | ICD-10-CM | POA: Diagnosis not present

## 2020-11-11 DIAGNOSIS — D229 Melanocytic nevi, unspecified: Secondary | ICD-10-CM

## 2020-11-11 MED ORDER — FLUOCINONIDE 0.05 % EX SOLN: 1.0000 "application " | Freq: Two times a day (BID) | CUTANEOUS | 3 refills | Status: DC | PRN

## 2020-11-11 MED ORDER — VALACYCLOVIR HCL 1 G PO TABS: 1000.0000 mg | ORAL_TABLET | ORAL | 11 refills | Status: AC

## 2020-11-11 NOTE — Patient Instructions (Signed)
Psoriasis is a chronic non-curable, but treatable genetic/hereditary disease that may have other systemic features affecting other organ systems such as joints (Psoriatic Arthritis). It is associated with an increased risk of inflammatory bowel disease, heart disease, non-alcoholic fatty liver disease, and depression.    Wound Care Instructions  1. Cleanse wound gently with soap and water once a day then pat dry with clean gauze. Apply a thing coat of Petrolatum (petroleum jelly, "Vaseline") over the wound (unless you have an allergy to this). We recommend that you use a new, sterile tube of Vaseline. Do not pick or remove scabs. Do not remove the yellow or white "healing tissue" from the base of the wound.  2. Cover the wound with fresh, clean, nonstick gauze and secure with paper tape. You may use Band-Aids in place of gauze and tape if the would is small enough, but would recommend trimming much of the tape off as there is often too much. Sometimes Band-Aids can irritate the skin.  3. You should call the office for your biopsy report after 1 week if you have not already been contacted.  4. If you experience any problems, such as abnormal amounts of bleeding, swelling, significant bruising, significant pain, or evidence of infection, please call the office immediately.  5. FOR ADULT SURGERY PATIENTS: If you need something for pain relief you may take 1 extra strength Tylenol (acetaminophen) AND 2 Ibuprofen (200mg  each) together every 4 hours as needed for pain. (do not take these if you are allergic to them or if you have a reason you should not take them.) Typically, you may only need pain medication for 1 to 3 days.

## 2020-11-11 NOTE — Progress Notes (Signed)
Follow-Up Visit   Subjective  Darren Baker is a 50 y.o. male who presents for the following: Annual Exam (History of dysplastic nevus - TBSE today) and Other (Spot of buttock that scabs over). The patient presents for Total-Body Skin Exam (TBSE) for skin cancer screening and mole check.  The following portions of the chart were reviewed this encounter and updated as appropriate:   Tobacco  Allergies  Meds  Problems  Med Hx  Surg Hx  Fam Hx     Review of Systems:  No other skin or systemic complaints except as noted in HPI or Assessment and Plan.  Objective  Well appearing patient in no apparent distress; mood and affect are within normal limits.  A full examination was performed including scalp, head, eyes, ears, nose, lips, neck, chest, axillae, abdomen, back, buttocks, bilateral upper extremities, bilateral lower extremities, hands, feet, fingers, toes, fingernails, and toenails. All findings within normal limits unless otherwise noted below.  Objective  Scalp: Mild scale  Objective  Left post neck: Erythematous keratotic or waxy stuck-on papule or plaque.   Objective  Face (7): Erythematous thin papules/macules with gritty scale.   Objective  Gluteal Crease: 2 pink patches with crust  Assessment & Plan    Lentigines - Scattered tan macules - Discussed due to sun exposure - Benign, observe - Call for any changes  Seborrheic Keratoses - Stuck-on, waxy, tan-brown papules and plaques  - Discussed benign etiology and prognosis. - Observe - Call for any changes  Melanocytic Nevi - Tan-brown and/or pink-flesh-colored symmetric macules and papules - Benign appearing on exam today - Observation - Call clinic for new or changing moles - Recommend daily use of broad spectrum spf 30+ sunscreen to sun-exposed areas.   Hemangiomas - Red papules - Discussed benign nature - Observe - Call for any changes  Actinic Damage - Chronic, secondary to cumulative  UV/sun exposure - diffuse scaly erythematous macules with underlying dyspigmentation - Recommend daily broad spectrum sunscreen SPF 30+ to sun-exposed areas, reapply every 2 hours as needed.  - Call for new or changing lesions.  Skin cancer screening performed today.  Psoriasis of scalp with pruritus Scalp Psoriasis is a chronic non-curable, but treatable genetic/hereditary disease that may have other systemic features affecting other organ systems such as joints (Psoriatic Arthritis). It is associated with an increased risk of inflammatory bowel disease, heart disease, non-alcoholic fatty liver disease, and depression.    Start Fluocinonide solution qd-bid  fluocinonide (LIDEX) 0.05 % external solution - Scalp  Inflamed seborrheic keratosis Left post neck Recheck on follow up  Destruction of lesion - Left post neck Complexity: simple   Destruction method: cryotherapy   Informed consent: discussed and consent obtained   Timeout:  patient name, date of birth, surgical site, and procedure verified Lesion destroyed using liquid nitrogen: Yes   Region frozen until ice ball extended beyond lesion: Yes   Outcome: patient tolerated procedure well with no complications   Post-procedure details: wound care instructions given    AK (actinic keratosis) (7) Face Destruction of lesion - Face Complexity: simple   Destruction method: cryotherapy   Informed consent: discussed and consent obtained   Timeout:  patient name, date of birth, surgical site, and procedure verified Lesion destroyed using liquid nitrogen: Yes   Region frozen until ice ball extended beyond lesion: Yes   Outcome: patient tolerated procedure well with no complications   Post-procedure details: wound care instructions given    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  2 pink macules.  History of crusts and sores occurring in the same location periodically. Consider culture or biopsy if active  at a visit in the future. Start Valacyclovir 1g 2 po with first symptoms of fever blister then 2 po 12 hours later.  May also take 1 p.o. twice daily for 5 days for episode. Herpes Simplex Virus = Cold Sores = Fever Blisters is a chronic recurring blistering; scabbing sore-producing viral infection that is recurrent usually in the same area triggered by stress, sun/UV exposure and trauma.  It is infectious and can be spread from person to person by direct contact.  It is not curable, but is treatable with topical and oral medication. valACYclovir (VALTREX) 1000 MG tablet - Gluteal Crease  Skin cancer screening  Return in about 2 months (around 01/09/2021) for AK follow up.  I, Ashok Cordia, CMA, am acting as scribe for Sarina Ser, MD .  Documentation: I have reviewed the above documentation for accuracy and completeness, and I agree with the above.  Sarina Ser, MD

## 2020-11-14 ENCOUNTER — Encounter: Payer: Self-pay | Admitting: Dermatology

## 2020-11-30 ENCOUNTER — Telehealth: Payer: Self-pay

## 2020-11-30 NOTE — Telephone Encounter (Signed)
Patient called regarding Valtrex instructions and use. He is confused as his after visit summer states one thing and the prescription states another. Patient did start taking medication Sunday, 2 PO BID and then again 2 PO BID yesterday. Patient states he had a very upset stomach and had to leave work but unsure if it was related to the medication or a stomach bug as he works in the school system.   Should patient continue the RX? If so how would you like him to continue?

## 2020-12-02 NOTE — Telephone Encounter (Signed)
May take : 2 po immediately at onset of fever blister symptoms; then 2 po 12 hours later  OR  1 po bid for 5 days starting immediately at onset of fever blister symptoms.   Either way is OK.  Whichever method he prefers.

## 2020-12-02 NOTE — Telephone Encounter (Signed)
Left patient detailed voicemail regarding information.

## 2020-12-31 ENCOUNTER — Other Ambulatory Visit: Payer: Self-pay

## 2020-12-31 ENCOUNTER — Encounter: Payer: Self-pay | Admitting: Family Medicine

## 2020-12-31 ENCOUNTER — Ambulatory Visit (INDEPENDENT_AMBULATORY_CARE_PROVIDER_SITE_OTHER): Payer: BC Managed Care – PPO | Admitting: Family Medicine

## 2020-12-31 VITALS — BP 144/94 | HR 63 | Temp 97.3°F | Resp 18 | Wt 279.0 lb

## 2020-12-31 DIAGNOSIS — Z125 Encounter for screening for malignant neoplasm of prostate: Secondary | ICD-10-CM

## 2020-12-31 DIAGNOSIS — Z0000 Encounter for general adult medical examination without abnormal findings: Secondary | ICD-10-CM

## 2020-12-31 DIAGNOSIS — R0609 Other forms of dyspnea: Secondary | ICD-10-CM

## 2020-12-31 DIAGNOSIS — M5136 Other intervertebral disc degeneration, lumbar region: Secondary | ICD-10-CM

## 2020-12-31 DIAGNOSIS — Z23 Encounter for immunization: Secondary | ICD-10-CM | POA: Diagnosis not present

## 2020-12-31 DIAGNOSIS — R0600 Dyspnea, unspecified: Secondary | ICD-10-CM

## 2020-12-31 DIAGNOSIS — F411 Generalized anxiety disorder: Secondary | ICD-10-CM

## 2020-12-31 DIAGNOSIS — I10 Essential (primary) hypertension: Secondary | ICD-10-CM | POA: Diagnosis not present

## 2020-12-31 DIAGNOSIS — Z0001 Encounter for general adult medical examination with abnormal findings: Secondary | ICD-10-CM | POA: Diagnosis not present

## 2020-12-31 MED ORDER — ALPRAZOLAM 0.5 MG PO TABS: 0.5000 mg | ORAL_TABLET | Freq: Three times a day (TID) | ORAL | 0 refills | Status: DC | PRN

## 2020-12-31 NOTE — Progress Notes (Signed)
Subjective:    Patient ID: Darren Baker, male    DOB: 22-Oct-1970, 50 y.o.   MRN: [id number]  HPI  patient is here today for complete physical exam.  He is due for a prostate cancer screening with a PSA.  He is also up-to-date on his colon cancer screening.  He had Cologuard 2 years ago in 2020 that was negative.  We again discussed colonoscopies today but he declines this at the present time.  He would be due for repeat Cologuard next year.  He is requesting a refill on his Xanax that he uses sparingly as needed for anxiety.  He has a history of known degenerative disc disease in the lumbar spine.  He is asking for referral to Dr. Dorene Ar with [facility].  He is requesting this for interventional pain management for his back pain and leg pain.  I am perfectly comfortable with this.  We are refilling his narcotic pain medication which he uses sparingly however he is interested in other modalities to help manage pain.  Therefore I will gladly make this referral.  Past Medical History:  Diagnosis Date  . DDD (degenerative disc disease), lumbar   . Dysplastic nevus 02/24/2016   L chest - mild   . Hypertension   . Low back pain   . Migraine   . Obesity   . Post concussion syndrome    Current Outpatient Medications on File Prior to Visit  Medication Sig Dispense Refill  . ALPRAZolam (XANAX) 0.5 MG tablet Take 1 tablet (0.5 mg total) by mouth 3 (three) times daily as needed for anxiety. 90 tablet 0  . chlorpheniramine-HYDROcodone (TUSSIONEX PENNKINETIC ER) 10-8 MG/5ML SUER Take 5 mLs by mouth every 12 (twelve) hours as needed for cough. 140 mL 0  . clotrimazole-betamethasone (LOTRISONE) cream Apply 1 application topically 2 (two) times daily. 30 g 0  . eletriptan (RELPAX) 40 MG tablet Take 40 mg by mouth as needed for migraine. One tablet by mouth at onset of headache. May repeat in 2 hours if headache persists or recurs.    . fluocinonide (LIDEX) 0.05 % external solution Apply 1  application topically 2 (two) times daily as needed. 180 mL 3  . fluticasone (FLONASE) 50 MCG/ACT nasal spray Place 2 sprays into both nostrils daily. 16 g 6  . HYDROcodone-acetaminophen (NORCO/VICODIN) 5-325 MG tablet Take 1 tablet by mouth every 6 (six) hours as needed for moderate pain. 90 tablet 0  . LINZESS 145 MCG CAPS capsule TAKE 1 CAPSULE BY MOUTH DAILY BEFORE BREAKFAST. 30 capsule 11  . losartan-hydrochlorothiazide (HYZAAR) 50-12.5 MG tablet TAKE 1 TABLET BY MOUTH EVERY DAY 90 tablet 3  . magnesium oxide (MAG-OX) 400 (241.3 MG) MG tablet Take 800 mg by mouth daily.     . meclizine (ANTIVERT) 25 MG tablet Take 1 tablet (25 mg total) by mouth 3 (three) times daily as needed for dizziness. 30 tablet 0  . meloxicam (MOBIC) 7.5 MG tablet Take 1 tablet (7.5 mg total) by mouth daily. (Patient taking differently: Take 7.5-15 mg by mouth daily. ) 30 tablet 5  . neomycin-polymyxin b-dexamethasone (MAXITROL) 3.5-10000-0.1 SUSP     . tiZANidine (ZANAFLEX) 4 MG tablet Take 4-8 mg by mouth at bedtime as needed.    . UBRELVY 100 MG TABS TAKE 1 TAB AT ONSET OF HEADACHE. MAY REPEAT IN 2 HOURS. MAX 2 TABS/24 HOURS    . valACYclovir (VALTREX) 1000 MG tablet Take 1 tablet (1,000 mg total) by mouth as directed. Take  2 po with first symptoms of fever blister then 2 po 12 hours later 30 tablet 11  . verapamil (CALAN-SR) 240 MG CR tablet Take 240 mg by mouth daily.  3   No current facility-administered medications on file prior to visit.   No Known Allergies Social History   Socioeconomic History  . Marital status: Single    Spouse name: Not on file  . Number of children: Not on file  . Years of education: Not on file  . Highest education level: Not on file  Occupational History  . Not on file  Tobacco Use  . Smoking status: Never Smoker  . Smokeless tobacco: Never Used  Substance and Sexual Activity  . Alcohol use: Yes    Comment: Rare  . Drug use: No  . Sexual activity: Not on file    Comment:  Married, works for [employer].  Other Topics Concern  . Not on file  Social History Narrative  . Not on file   Social Determinants of Health   Financial Resource Strain: Not on file  Food Insecurity: Not on file  Transportation Needs: Not on file  Physical Activity: Not on file  Stress: Not on file  Social Connections: Not on file  Intimate Partner Violence: Not on file   Family History  Problem Relation Age of Onset  . Diabetes Father   . Heart disease Father   . Hyperlipidemia Father   . Hypertension Father      Review of Systems  All other systems reviewed and are negative.      Objective:   Physical Exam Vitals reviewed.  Constitutional:      General: He is not in acute distress.    Appearance: He is well-developed. He is not diaphoretic.  HENT:     Head: Normocephalic and atraumatic.     Right Ear: External ear normal.     Left Ear: External ear normal.     Nose: Nose normal.     Mouth/Throat:     Pharynx: No oropharyngeal exudate.  Eyes:     General: No scleral icterus.       Right eye: No discharge.        Left eye: No discharge.     Conjunctiva/sclera: Conjunctivae normal.     Pupils: Pupils are equal, round, and reactive to light.  Neck:     Thyroid: No thyromegaly.     Vascular: No JVD.     Trachea: No tracheal deviation.  Cardiovascular:     Rate and Rhythm: Normal rate and regular rhythm.     Heart sounds: Normal heart sounds. No murmur heard. No friction rub. No gallop.   Pulmonary:     Effort: Pulmonary effort is normal. No respiratory distress.     Breath sounds: Normal breath sounds. No stridor. No wheezing or rales.  Chest:     Chest wall: No tenderness.  Abdominal:     General: Bowel sounds are normal. There is no distension.     Palpations: Abdomen is soft. There is no mass.     Tenderness: There is no abdominal tenderness. There is no guarding or rebound.     Hernia: No hernia is present.  Genitourinary:    Penis: Normal.       Rectum: Normal.  Musculoskeletal:        General: No tenderness or deformity.     Cervical back: Normal range of motion and neck supple.  Lymphadenopathy:     Cervical: No cervical  adenopathy.  Skin:    General: Skin is warm.     Coloration: Skin is not pale.     Findings: Rash present.  Neurological:     Mental Status: He is alert and oriented to person, place, and time.     Cranial Nerves: No cranial nerve deficit.     Sensory: No sensory deficit.     Motor: No abnormal muscle tone.     Coordination: Coordination normal.           Assessment & Plan:  General medical exam - Plan: CBC with Differential/Platelet, COMPLETE METABOLIC PANEL WITH GFR, Lipid panel, PSA  Benign essential HTN - Plan: CBC with Differential/Platelet, COMPLETE METABOLIC PANEL WITH GFR, Lipid panel, PSA  Prostate cancer screening - Plan: CBC with Differential/Platelet, COMPLETE METABOLIC PANEL WITH GFR, Lipid panel, PSA  DDD (degenerative disc disease), lumbar  GAD (generalized anxiety disorder)  Dyspnea on exertion - Plan: ECHOCARDIOGRAM COMPLETE  Need for tetanus booster - Plan: Tdap vaccine greater than or equal to 7yo IM, CANCELED: Td vaccine greater than or equal to 7yo preservative free IM  Physical exam today is significant for obesity.  I will check a CBC, CMP, lipid panel, and PSA.  Colonoscopy is recommended however the patient has had Cologuard 2 years ago and needs another Cologuard in 1 year if he declines colonoscopy.  I will consult pain management regarding his back and leg pain.  I will refill his Xanax that he uses sparingly for anxiety.  As I was leaving the room, the patient mentions that he is experiencing dyspnea on exertion.  He states that he gets easily winded doing activity.  He specifically denies angina or palpitations or lightheadedness.  I believe a lot of this is deconditioning however I will schedule the patient for an echocardiogram of the heart.  He is also due for a  tetanus shot.

## 2021-01-01 LAB — CBC WITH DIFFERENTIAL/PLATELET
Absolute Monocytes: 605 {cells}/uL (ref 200–950)
Basophils Absolute: 61 {cells}/uL (ref 0–200)
Basophils Relative: 0.9 %
Eosinophils Absolute: 388 {cells}/uL (ref 15–500)
Eosinophils Relative: 5.7 %
HCT: 44 % (ref 38.5–50.0)
Hemoglobin: 14.9 g/dL (ref 13.2–17.1)
Lymphs Abs: 1516 {cells}/uL (ref 850–3900)
MCH: 29.4 pg (ref 27.0–33.0)
MCHC: 33.9 g/dL (ref 32.0–36.0)
MCV: 86.8 fL (ref 80.0–100.0)
MPV: 11.5 fL (ref 7.5–12.5)
Monocytes Relative: 8.9 %
Neutro Abs: 4230 {cells}/uL (ref 1500–7800)
Neutrophils Relative %: 62.2 %
Platelets: 268 10*3/uL (ref 140–400)
RBC: 5.07 Million/uL (ref 4.20–5.80)
RDW: 13.6 % (ref 11.0–15.0)
Total Lymphocyte: 22.3 %
WBC: 6.8 10*3/uL (ref 3.8–10.8)

## 2021-01-01 LAB — COMPLETE METABOLIC PANEL WITHOUT GFR
AG Ratio: 2 (calc) (ref 1.0–2.5)
ALT: 16 U/L (ref 9–46)
AST: 19 U/L (ref 10–35)
Albumin: 4.7 g/dL (ref 3.6–5.1)
Alkaline phosphatase (APISO): 73 U/L (ref 35–144)
BUN: 18 mg/dL (ref 7–25)
CO2: 23 mmol/L (ref 20–32)
Calcium: 10.6 mg/dL — ABNORMAL HIGH (ref 8.6–10.3)
Chloride: 104 mmol/L (ref 98–110)
Creat: 1.23 mg/dL (ref 0.70–1.33)
GFR, Est African American: 78 mL/min/{1.73_m2}
GFR, Est Non African American: 67 mL/min/{1.73_m2}
Globulin: 2.4 g/dL (ref 1.9–3.7)
Glucose, Bld: 89 mg/dL (ref 65–99)
Potassium: 4.6 mmol/L (ref 3.5–5.3)
Sodium: 141 mmol/L (ref 135–146)
Total Bilirubin: 0.5 mg/dL (ref 0.2–1.2)
Total Protein: 7.1 g/dL (ref 6.1–8.1)

## 2021-01-01 LAB — LIPID PANEL
Cholesterol: 154 mg/dL
HDL: 45 mg/dL
LDL Cholesterol (Calc): 88 mg/dL
Non-HDL Cholesterol (Calc): 109 mg/dL
Total CHOL/HDL Ratio: 3.4 (calc)
Triglycerides: 110 mg/dL

## 2021-01-01 LAB — PSA: PSA: 0.74 ng/mL (ref <=4.0)

## 2021-01-13 ENCOUNTER — Encounter: Payer: Self-pay | Admitting: Dermatology

## 2021-01-13 ENCOUNTER — Ambulatory Visit: Payer: BC Managed Care – PPO | Admitting: Dermatology

## 2021-01-13 ENCOUNTER — Other Ambulatory Visit: Payer: Self-pay

## 2021-01-13 DIAGNOSIS — L280 Lichen simplex chronicus: Secondary | ICD-10-CM

## 2021-01-13 DIAGNOSIS — L820 Inflamed seborrheic keratosis: Secondary | ICD-10-CM

## 2021-01-13 DIAGNOSIS — L304 Erythema intertrigo: Secondary | ICD-10-CM | POA: Diagnosis not present

## 2021-01-13 DIAGNOSIS — L409 Psoriasis, unspecified: Secondary | ICD-10-CM | POA: Diagnosis not present

## 2021-01-13 DIAGNOSIS — L570 Actinic keratosis: Secondary | ICD-10-CM

## 2021-01-13 DIAGNOSIS — B009 Herpesviral infection, unspecified: Secondary | ICD-10-CM

## 2021-01-13 DIAGNOSIS — L408 Other psoriasis: Secondary | ICD-10-CM

## 2021-01-13 DIAGNOSIS — L578 Other skin changes due to chronic exposure to nonionizing radiation: Secondary | ICD-10-CM

## 2021-01-13 MED ORDER — MOMETASONE FUROATE 0.1 % EX CREA: 1.0000 "application " | TOPICAL_CREAM | CUTANEOUS | 1 refills | Status: DC

## 2021-01-13 NOTE — Progress Notes (Signed)
Follow-Up Visit   Subjective  Darren Baker is a 50 y.o. 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  The following portions of the chart were reviewed this encounter and updated as appropriate:   Tobacco  Allergies  Meds  Problems  Med Hx  Surg Hx  Fam Hx     Review of Systems:  No other skin or systemic complaints except as noted in HPI or Assessment and Plan.  Objective  Well appearing patient in no apparent distress; mood and affect are within normal limits.  A focused examination was performed including face, neck, gluteal crease. Relevant physical exam findings are noted in the Assessment and Plan.  Objective  L post neck x 1: Residual stuck on waxy pap with erythema L post neck  Objective  R temple x 1: Pink scaly macules   Objective  Scalp: Mild scale scalp Gluteal crease with hyperpigmentation, otherwise clear  Objective  Gluteal Crease: Gluteal crease with hyperpigmentation otherwise clear  Objective  groin: Clear today  Objective  Gluteal Crease: Clear today   Assessment & Plan  Inflamed seborrheic keratosis L post neck x 1 Residual ISK L post neck  Destruction of lesion - L post neck x 1 Complexity: simple   Destruction method: cryotherapy   Informed consent: discussed and consent obtained   Timeout:  patient name, date of birth, surgical site, and procedure verified Lesion destroyed using liquid nitrogen: Yes   Region frozen until ice ball extended beyond lesion: Yes   Outcome: patient tolerated procedure well with no complications   Post-procedure details: wound care instructions given    Destruction of lesion - L post neck x 1  AK (actinic keratosis) R temple x 1 Destruction of lesion -  R temple x 1 Complexity: simple   Destruction method: cryotherapy   Informed consent: discussed and consent obtained   Timeout:  patient name, date of birth, surgical site, and procedure verified Lesion destroyed using liquid nitrogen: Yes   Region frozen until ice ball extended beyond lesion: Yes   Outcome: patient tolerated procedure well with no complications   Post-procedure details: wound care instructions given    Actinic Damage - chronic, secondary to cumulative UV radiation exposure/sun exposure over time - diffuse scaly erythematous macules with underlying dyspigmentation - Recommend daily broad spectrum sunscreen SPF 30+ to sun-exposed areas, reapply every 2 hours as needed.  - Recommend staying in the shade or wearing long sleeves, sun glasses (UVA+UVB protection) and wide brim hats (4-inch brim around the entire circumference of the hat). - Call for new or changing lesions.  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 It is associated with an increased risk of inflammatory bowel disease, heart disease, non-alcoholic fatty liver disease, and depression.    Discussed scratching/rubbing can worsen  Restart Fluocinonide sol several gtts aa scalp qd/bid  Other Related Medications fluocinonide (LIDEX) 0.05 % external solution  Inverse psoriasis with intertrigo Gluteal Crease; groin With Intertrigo Start Mometasone cr qd up to 5d/wk aa buttocks prn flares  mometasone (ELOCON) 0.1 % cream - Gluteal Crease  Intertrigo with  psoriasis inverse Groin; gluteal crease With Psoriasis Inversa  Intertrigo is a chronic recurrent rash that occurs in skin fold areas that may be associated with friction; heat; moisture; yeast; fungus; and bacteria.  It is exacerbated by increased movement /  activity; sweating; and higher atmospheric temperature.   Start [facility] Iodoquinol 1%, Hydrocortisone 2.5%, Niacinamide 2% Cream once a day to affected areas for up to two weeks.  The patient was advised this is not covered by insurance since it is made by a compounding pharmacy. They will receive an email to check out and the medication will be mailed to their home.    Rash Gluteal Crease Possible hx of HSV, clear today Discussed restarting Valtrex 1g if recurs/ directions: Valtrex 1g 2 po at onset of symptoms, then 2 po 12 hours later OR Valtrex 1g 1 po bid for 5-7 days with each episode If recurs, may need viral culture to prove or disprove whether HSV or not.  He may come to clinic with an acute flare for exam and possible culture.  Return in about 6 months (around 07/15/2021), or recheck AKs, Intertrigo, Psoriasis, Psorisaid Inversa.  I, [name], RMA, am acting as scribe for Sarina Ser, MD .  Documentation: I have reviewed the above documentation for accuracy and completeness, and I agree with the above.  Sarina Ser, MD

## 2021-01-13 NOTE — Patient Instructions (Addendum)
If you have any questions or concerns for your doctor, please call our main line at [phone] and press option 4 to reach your doctor's medical assistant. If no one answers, please leave a voicemail as directed and we will return your call as soon as possible. Messages left after 4 pm will be answered the following business day.   You may also send [place] a message via [place]. We typically respond to MyChart messages within 1-2 business days.  For prescription refills, please ask your pharmacy to contact our office. Our fax number is [phone].  If you have an urgent issue when the clinic is closed that cannot wait until the next business day, you can page your doctor at the number below.    Please note that while we do our best to be available for urgent issues outside of office hours, we are not available 24/7.   If you have an urgent issue and are unable to reach [place], you may choose to seek medical care at your doctor's office, retail clinic, urgent care center, or emergency room.  If you have a medical emergency, please immediately call 911 or go to the emergency department.  Pager Numbers  - Dr. Nehemiah Massed: [phone]  - Dr. Laurence Ferrari: [phone]  - Dr. Nicole Kindred: [phone]  In the event of inclement weather, please call our main line at [phone] for an update on the status of any delays or closures.  Dermatology Medication Tips: Please keep the boxes that topical medications come in in order to help keep track of the instructions about where and how to use these. Pharmacies typically print the medication instructions only on the boxes and not directly on the medication tubes.   If your medication is too expensive, please contact our office at [phone] option 4 or send [place] a message through [place].   We are unable to tell what your co-pay for medications will be in advance as this is different depending on your insurance coverage. However, we may be able to find a substitute  medication at lower cost or fill out paperwork to get insurance to cover a needed medication.   If a prior authorization is required to get your medication covered by your insurance company, please allow [place] 1-2 business days to complete this process.  Drug prices often vary depending on where the prescription is filled and some pharmacies may offer cheaper prices.  The website www.goodrx.com contains coupons for medications through different pharmacies. The prices here do not account for what the cost may be with help from insurance (it may be cheaper with your insurance), but the website can give you the price if you did not use any insurance.  - You can print the associated coupon and take it with your prescription to the pharmacy.  - You may also stop by our office during regular business hours and pick up a GoodRx coupon card.  - If you need your prescription sent electronically to a different pharmacy, notify our office through [facility] or by phone at [phone] option 4.   [facility] Intertrigo mix will be used in groin once daily when flared  The Mometasone if for the buttocks crease to use once daily up to 5 days a week when flared  Fluocinonide solution is for the scalp 1-2 drops to affected area scalp 1-2 times a day when flared   Instructions for [facility] Medications  One or more of your medications was sent to the [facility] mail order  compounding pharmacy. You will receive an email from them and can purchase the medicine through that link. It will then be mailed to your home at the address you confirmed. If for any reason you do not receive an email from them, please check your spam folder. If you still do not find the email, please let us know. [facility] phone number is [phone].

## 2021-01-24 ENCOUNTER — Encounter (HOSPITAL_COMMUNITY): Payer: Self-pay | Admitting: Family Medicine

## 2021-02-09 ENCOUNTER — Ambulatory Visit (HOSPITAL_COMMUNITY): Payer: BC Managed Care – PPO | Attending: Cardiology

## 2021-02-09 ENCOUNTER — Other Ambulatory Visit: Payer: Self-pay

## 2021-02-09 DIAGNOSIS — R0600 Dyspnea, unspecified: Secondary | ICD-10-CM | POA: Diagnosis not present

## 2021-02-09 DIAGNOSIS — R0609 Other forms of dyspnea: Secondary | ICD-10-CM

## 2021-02-09 LAB — ECHOCARDIOGRAM COMPLETE
Area-P 1/2: 3.26 cm2
S' Lateral: 2.9 cm

## 2021-02-17 ENCOUNTER — Ambulatory Visit (HOSPITAL_COMMUNITY): Payer: BC Managed Care – PPO

## 2021-05-23 ENCOUNTER — Telehealth (INDEPENDENT_AMBULATORY_CARE_PROVIDER_SITE_OTHER): Payer: BC Managed Care – PPO | Admitting: Nurse Practitioner

## 2021-05-23 ENCOUNTER — Encounter: Payer: Self-pay | Admitting: Nurse Practitioner

## 2021-05-23 ENCOUNTER — Other Ambulatory Visit: Payer: Self-pay

## 2021-05-23 DIAGNOSIS — R509 Fever, unspecified: Secondary | ICD-10-CM

## 2021-05-23 NOTE — Progress Notes (Addendum)
Subjective:    Patient ID: Darren Baker, male    DOB: Feb 09, 1971, 50 y.o.   MRN: [id number]  HPI: Darren Baker is a 50 y.o. male presenting virtually for fever, congestion, and vomiting.  Chief Complaint  Patient presents with   URI   UPPER RESPIRATORY TRACT INFECTION Onset: 1 day ago COVID-19 testing history: negative test today at home COVID-19 vaccination status: has had 2 shots and both boosters Fever: yes; 101.1 highest Body aches: yes Cold chills: yes Cough: no Shortness of breath: no Wheezing: no Chest pain: no Chest tightness: no Chest congestion: no Nasal congestion: yes Runny nose: no Post nasal drip: no Sneezing: no Sore throat: yes Swollen glands: no Sinus pressure: yes Headache: yes Face pain: no Toothache: yes Ear pain: no  Ear pressure: no  Eyes red/itching:no Eye drainage/crusting: no  Nausea: yes Vomiting: yes; was projectile per patient Bowel movement: no Abdominal pain: no Passing gas: yes Diarrhea: no  Change in appetite:  yes; decreaed   Loss of taste/smell: no  Rash: no Fatigue: yes Weakness: yes Sick contacts: no Strep contacts: no  Context: worse Recurrent sinusitis: no Treatments attempted: Sudafed, aspirin Relief with OTC medications: yes  Got food poisoning about 2 years ago, 1 year ago, was worked up for issue with gall bladder.  Pain subsided at that time on its own.  No Known Allergies  Outpatient Encounter Medications as of 05/23/2021  Medication Sig   ALPRAZolam (XANAX) 0.5 MG tablet Take 1 tablet (0.5 mg total) by mouth 3 (three) times daily as needed for anxiety.   eletriptan (RELPAX) 40 MG tablet Take 40 mg by mouth as needed for migraine. One tablet by mouth at onset of headache. May repeat in 2 hours if headache persists or recurs.   fluocinonide (LIDEX) 0.05 % external solution Apply 1 application topically 2 (two) times daily as needed.   fluticasone (FLONASE) 50 MCG/ACT nasal spray Place 2 sprays  into both nostrils daily. (Patient not taking: Reported on 12/31/2020)   HYDROcodone-acetaminophen (NORCO/VICODIN) 5-325 MG tablet Take 1 tablet by mouth every 6 (six) hours as needed for moderate pain.   LINZESS 145 MCG CAPS capsule TAKE 1 CAPSULE BY MOUTH DAILY BEFORE BREAKFAST.   losartan-hydrochlorothiazide (HYZAAR) 50-12.5 MG tablet TAKE 1 TABLET BY MOUTH EVERY DAY   magnesium oxide (MAG-OX) 400 (241.3 MG) MG tablet Take 800 mg by mouth daily.    meclizine (ANTIVERT) 25 MG tablet Take 1 tablet (25 mg total) by mouth 3 (three) times daily as needed for dizziness. (Patient not taking: Reported on 12/31/2020)   meloxicam (MOBIC) 7.5 MG tablet Take 1 tablet (7.5 mg total) by mouth daily. (Patient taking differently: Take 7.5-15 mg by mouth daily.)   mometasone (ELOCON) 0.1 % cream Apply 1 application topically as directed. Qd up to 5 days a week aa rash in buttocks crease   tiZANidine (ZANAFLEX) 4 MG tablet Take 4-8 mg by mouth at bedtime as needed.   UBRELVY 100 MG TABS TAKE 1 TAB AT ONSET OF HEADACHE. MAY REPEAT IN 2 HOURS. MAX 2 TABS/24 HOURS   valACYclovir (VALTREX) 1000 MG tablet Take 1 tablet (1,000 mg total) by mouth as directed. Take 2 po with first symptoms of fever blister then 2 po 12 hours later   verapamil (CALAN-SR) 240 MG CR tablet Take 240 mg by mouth daily.   No facility-administered encounter medications on file as of 05/23/2021.    Patient Active Problem List   Diagnosis Date Noted  Migraine    Low back pain    Obesity    DDD (degenerative disc disease), lumbar    Post concussion syndrome    Hypertension     Past Medical History:  Diagnosis Date   DDD (degenerative disc disease), lumbar    Dysplastic nevus 02/24/2016   L chest - mild    Hypertension    Low back pain    Migraine    Obesity    Post concussion syndrome     Relevant past medical, surgical, family and social history reviewed and updated as indicated. Interim medical history since our last visit  reviewed.  Review of Systems Per HPI unless specifically indicated above     Objective:    There were no vitals taken for this visit.  Wt Readings from Last 3 Encounters:  12/31/20 279 lb (126.6 kg)  07/15/20 280 lb (127 kg)  03/26/20 283 lb (128.4 kg)    Physical Exam Vitals and nursing note reviewed.  Constitutional:      General: He is not in acute distress.    Appearance: Normal appearance. He is obese. He is not toxic-appearing.  HENT:     Head: Normocephalic and atraumatic.     Nose: Nose normal. No congestion or rhinorrhea.  Eyes:     General: No scleral icterus.       Right eye: No discharge.        Left eye: No discharge.  Cardiovascular:     Comments: Unable to assess heart sounds via virtual visit. Pulmonary:     Effort: Pulmonary effort is normal. No respiratory distress.     Comments: Unable to assess breath sounds via virtual visit.  Patient talking in complete sentences during telemedicine visit without accessory muscle use. Abdominal:     General: Abdomen is flat. There is no distension.     Palpations: Abdomen is soft.     Tenderness: There is no abdominal tenderness.     Comments: Visualization of abdomen on camera - he palpated on abdomen without pain/difficulty  Skin:    Coloration: Skin is not jaundiced or pale.     Findings: No erythema.  Neurological:     Mental Status: He is alert and oriented to person, place, and time.  Psychiatric:        Mood and Affect: Mood normal.        Behavior: Behavior normal.        Thought Content: Thought content normal.        Judgment: Judgment normal.       Assessment & Plan:  1. Fever, unspecified fever cause Acute.  Differentials include viral gastroenteritis, respiratory virus, less likely small bowel obstruction as patient is without abdominal pain, has been tolerating fluids, and is passing gas.  Will obtain viral panel testing today.  Reassured patient that if viral gastroenteritis, symptoms will  improve over the next 72 hours.  Push fluids like pedialyte or zero sugar gatorade.  Stop Sudafed and start Mucinex if congestion present.  With any nausea/vomiting and unable to keep fluids down, seek emergent care.  With any severe abdominal pain, seek emergent care.  Follow up later this week as planned.  - SARS-CoV-2 RNA (COVID-19) and Respiratory Viral Panel, Qualitative NAAT   Follow up plan: Return for as scheduled.  Due to the catastrophic nature of the COVID-19 pandemic, this video visit was completed soley via audio and visual contact via [facility] due to the restrictions of the COVID-19 pandemic.  All issues  as above were discussed and addressed. Physical exam was done as above through visual confirmation on [facility]. If it was felt that the patient should be evaluated in the office, they were directed there. The patient verbally consented to this visit. Location of the patient: home Location of the provider: work Those involved with this call:  Provider: Noemi Chapel, DNP, FNP-C CMA: n/a Front Desk/Registration: Vevelyn Pat  Time spent on call:  17 minutes with patient face to face via video conference. More than 50% of this time was spent in counseling and coordination of care. 11 minutes total spent in review of patient's record and preparation of their chart. I verified patient identity using two factors (patient name and date of birth). Patient consents verbally to being seen via telemedicine visit today.

## 2021-05-25 ENCOUNTER — Other Ambulatory Visit: Payer: Self-pay

## 2021-05-25 ENCOUNTER — Emergency Department (HOSPITAL_COMMUNITY)
Admission: EM | Admit: 2021-05-25 | Discharge: 2021-05-25 | Disposition: A | Discharge status: Home / Self Care | Payer: BC Managed Care – PPO | Attending: Emergency Medicine | Admitting: Emergency Medicine

## 2021-05-25 ENCOUNTER — Encounter (HOSPITAL_COMMUNITY): Payer: Self-pay | Admitting: Emergency Medicine

## 2021-05-25 ENCOUNTER — Emergency Department (HOSPITAL_COMMUNITY): Payer: BC Managed Care – PPO

## 2021-05-25 DIAGNOSIS — R519 Headache, unspecified: Secondary | ICD-10-CM | POA: Insufficient documentation

## 2021-05-25 DIAGNOSIS — R197 Diarrhea, unspecified: Secondary | ICD-10-CM | POA: Insufficient documentation

## 2021-05-25 DIAGNOSIS — R509 Fever, unspecified: Secondary | ICD-10-CM | POA: Insufficient documentation

## 2021-05-25 DIAGNOSIS — R1031 Right lower quadrant pain: Secondary | ICD-10-CM | POA: Insufficient documentation

## 2021-05-25 DIAGNOSIS — R112 Nausea with vomiting, unspecified: Secondary | ICD-10-CM | POA: Insufficient documentation

## 2021-05-25 DIAGNOSIS — I10 Essential (primary) hypertension: Secondary | ICD-10-CM | POA: Insufficient documentation

## 2021-05-25 DIAGNOSIS — Z79899 Other long term (current) drug therapy: Secondary | ICD-10-CM | POA: Insufficient documentation

## 2021-05-25 DIAGNOSIS — Z20822 Contact with and (suspected) exposure to covid-19: Secondary | ICD-10-CM | POA: Diagnosis not present

## 2021-05-25 DIAGNOSIS — R143 Flatulence: Secondary | ICD-10-CM | POA: Insufficient documentation

## 2021-05-25 DIAGNOSIS — R109 Unspecified abdominal pain: Secondary | ICD-10-CM

## 2021-05-25 LAB — COMPREHENSIVE METABOLIC PANEL
ALT: 33 U/L (ref 0–44)
AST: 29 U/L (ref 15–41)
Albumin: 4.1 g/dL (ref 3.5–5.0)
Alkaline Phosphatase: 69 U/L (ref 38–126)
Anion gap: 9 (ref 5–15)
BUN: 18 mg/dL (ref 6–20)
CO2: 20 mmol/L — ABNORMAL LOW (ref 22–32)
Calcium: 9.2 mg/dL (ref 8.9–10.3)
Chloride: 110 mmol/L (ref 98–111)
Creatinine, Ser: 1.06 mg/dL (ref 0.61–1.24)
GFR, Estimated: >60 mL/min (ref >60)
Glucose, Bld: 121 mg/dL — ABNORMAL HIGH (ref 70–99)
Potassium: 3.8 mmol/L (ref 3.5–5.1)
Sodium: 139 mmol/L (ref 135–145)
Total Bilirubin: 0.7 mg/dL (ref 0.3–1.2)
Total Protein: 6.9 g/dL (ref 6.5–8.1)

## 2021-05-25 LAB — CBC
HCT: 42.6 % (ref 39.0–52.0)
Hemoglobin: 14.7 g/dL (ref 13.0–17.0)
MCH: 29.8 pg (ref 26.0–34.0)
MCHC: 34.5 g/dL (ref 30.0–36.0)
MCV: 86.2 fL (ref 80.0–100.0)
Platelets: 247 10*3/uL (ref 150–400)
RBC: 4.94 MIL/uL (ref 4.22–5.81)
RDW: 13.3 % (ref 11.5–15.5)
WBC: 10.4 10*3/uL (ref 4.0–10.5)
nRBC: 0 % (ref 0.0–0.2)

## 2021-05-25 LAB — URINALYSIS, ROUTINE W REFLEX MICROSCOPIC
Bilirubin Urine: NEGATIVE
Glucose, UA: NEGATIVE mg/dL
Ketones, ur: NEGATIVE mg/dL
Leukocytes,Ua: NEGATIVE
Nitrite: NEGATIVE
Protein, ur: NEGATIVE mg/dL
Specific Gravity, Urine: 1.019 (ref 1.005–1.030)
pH: 5 (ref 5.0–8.0)

## 2021-05-25 LAB — RESP PANEL BY RT-PCR (FLU A&B, COVID) ARPGX2
Influenza A by PCR: NEGATIVE
Influenza B by PCR: NEGATIVE
SARS Coronavirus 2 by RT PCR: NEGATIVE

## 2021-05-25 LAB — LIPASE, BLOOD: Lipase: 26 U/L (ref 11–51)

## 2021-05-25 MED ORDER — DIPHENHYDRAMINE HCL 50 MG/ML IJ SOLN
25.0000 mg | Freq: Once | INTRAMUSCULAR | Status: AC
  Administered 2021-05-25: 25 mg via INTRAVENOUS
  Filled 2021-05-25: qty 1

## 2021-05-25 MED ORDER — METOCLOPRAMIDE HCL 5 MG/ML IJ SOLN
10.0000 mg | Freq: Once | INTRAMUSCULAR | Status: AC
  Administered 2021-05-25: 10 mg via INTRAVENOUS
  Filled 2021-05-25: qty 2

## 2021-05-25 MED ORDER — ONDANSETRON 4 MG PO TBDP: 4.0000 mg | ORAL_TABLET | Freq: Three times a day (TID) | ORAL | 0 refills | Status: DC | PRN

## 2021-05-25 MED ORDER — SODIUM CHLORIDE 0.9 % IV BOLUS
1000.0000 mL | Freq: Once | INTRAVENOUS | Status: AC
  Administered 2021-05-25: 1000 mL via INTRAVENOUS

## 2021-05-25 MED ORDER — IOHEXOL 350 MG/ML SOLN
100.0000 mL | Freq: Once | INTRAVENOUS | Status: AC | PRN
  Administered 2021-05-25: 80 mL via INTRAVENOUS

## 2021-05-25 NOTE — ED Notes (Signed)
Returns from CT 

## 2021-05-25 NOTE — ED Provider Notes (Signed)
Emergency Medicine Provider Triage Evaluation Note  Darren Baker , a 50 y.o. male  was evaluated in triage.  Pt complains of abdominal pain.  Symptoms started about 3 days ago.  Reports nausea, vomiting, diarrhea.  Also complains of diffuse abdominal pain and distention.  States that he was experiencing fevers with a T-max of 44 F which was measured orally.  Patient has had multiple negative COVID-19 test since the onset of his symptoms.  Physical Exam  BP (!) 154/88 (BP Location: Right Arm)   Pulse (!) 103   Temp 98.7 F (37.1 C)   Resp 19   Ht '5\' 11"'$  (1.803 m)   Wt 127 kg   SpO2 97%   BMI 39.05 kg/m  Gen:   Awake, no distress   Resp:  Normal effort  MSK:   Moves extremities without difficulty  Other:    Medical Decision Making  Medically screening exam initiated at 9:42 AM.  Appropriate orders placed.  HO[facility] was informed that the remainder of the evaluation will be completed by another provider, this initial triage assessment does not replace that evaluation, and the importance of remaining in the ED until their evaluation is complete.   Rayna Sexton, PA-C 05/25/21 0944    Hayden Rasmussen, MD 05/25/21 [phone]

## 2021-05-25 NOTE — Discharge Instructions (Addendum)
I am prescribing you a medication called Zofran.  This is a disintegrating tablet you can use up to 3 times a day for management of your nausea and vomiting.  Please only take this as prescribed.  Please only take this if you are experiencing nausea and vomiting that you cannot control.  Please follow-up with your gastroenterologist.  If you develop any new or worsening symptoms please come back to the emergency department.  It was a pleasure to meet you.

## 2021-05-25 NOTE — ED Provider Notes (Signed)
Darren Baker   CSN: [id number] Arrival date & time: 05/25/21  0750     History Chief Complaint  Patient presents with   Nausea   Fever   Abdominal Pain    Darren Baker is a 50 y.o. male.  HPI Patient is a 50 year old male who presents to the emergency department due to abdominal pain.  States his symptoms started about 3 days ago.  They have been waxing and waning.  Reports associated nausea, vomiting, diarrhea, headaches.  Also reports worsening abdominal distention and increased flatulence.  Denies a surgical history to his abdomen and states that he has experienced similar symptoms in the past and has never received a formal diagnosis regarding his symptoms.  States he has had a negative COVID-19 test at home.  States he has also had waxing and waning fevers with a T-max of 68 F which was measured orally.    Past Medical History:  Diagnosis Date   DDD (degenerative disc disease), lumbar    Dysplastic nevus 02/24/2016   L chest - mild    Hypertension    Low back pain    Migraine    Obesity    Post concussion syndrome     Patient Active Problem List   Diagnosis Date Noted   Migraine    Low back pain    Obesity    DDD (degenerative disc disease), lumbar    Post concussion syndrome    Hypertension     History reviewed. No pertinent surgical history.     Family History  Problem Relation Age of Onset   Diabetes Father    Heart disease Father    Hyperlipidemia Father    Hypertension Father     Social History   Tobacco Use   Smoking status: Never   Smokeless tobacco: Never  Substance Use Topics   Alcohol use: Yes    Comment: Rare   Drug use: No    Home Medications Prior to Admission medications   Medication Sig Start Date End Date Taking? Authorizing Provider  acetaminophen (TYLENOL) 500 MG tablet Take 1,000 mg by mouth every 6 (six) hours as needed for moderate pain.   Yes [provider]   ALPRAZolam (XANAX) 0.5 MG tablet Take 1 tablet (0.5 mg total) by mouth 3 (three) times daily as needed for anxiety. 12/31/20  Yes Susy Frizzle, MD  eletriptan (RELPAX) 40 MG tablet Take 40 mg by mouth daily as needed for migraine. One tablet by mouth at onset of headache. May repeat in 2 hours if headache persists or recurs.   Yes [provider]  HYDROcodone-acetaminophen (NORCO/VICODIN) 5-325 MG tablet Take 1 tablet by mouth every 6 (six) hours as needed for moderate pain. 02/28/19  Yes Susy Frizzle, MD  hydroxypropyl methylcellulose / hypromellose (ISOPTO TEARS / GONIOVISC) 2.5 % ophthalmic solution Place 1 drop into both eyes daily as needed for dry eyes.   Yes [provider]  LINZESS 145 MCG CAPS capsule TAKE 1 CAPSULE BY MOUTH DAILY BEFORE BREAKFAST. Patient taking differently: Take 145 mcg by mouth daily as needed (irritable bowel syndrome). 04/19/20  Yes Susy Frizzle, MD  losartan-hydrochlorothiazide (HYZAAR) 50-12.5 MG tablet TAKE 1 TABLET BY MOUTH EVERY DAY Patient taking differently: Take 1 tablet by mouth daily. 09/06/20  Yes Susy Frizzle, MD  magnesium oxide (MAG-OX) 400 (241.3 MG) MG tablet Take 400-800 mg by mouth daily. 05/13/15  Yes [provider]  meloxicam (MOBIC) 7.5 MG  tablet Take 1 tablet (7.5 mg total) by mouth daily. Patient taking differently: Take 7.5-15 mg by mouth daily. 07/11/13  Yes Susy Frizzle, MD  methocarbamol (ROBAXIN) 500 MG tablet Take 500 mg by mouth every 8 (eight) hours as needed for muscle spasms. 02/02/21  Yes [provider]  ondansetron (ZOFRAN ODT) 4 MG disintegrating tablet Take 1 tablet (4 mg total) by mouth every 8 (eight) hours as needed for nausea or vomiting. 05/25/21  Yes Rayna Sexton, PA-C  tiZANidine (ZANAFLEX) 4 MG capsule Take 4 mg by mouth daily as needed for muscle spasms. 09/23/19  Yes [provider]  UBRELVY 100 MG TABS Take 100 mg by mouth daily as needed (migraine).  11/13/18  Yes [provider]  verapamil (CALAN-SR) 240 MG CR tablet Take 240 mg by mouth daily. 04/09/17  Yes [provider]  fluocinonide (LIDEX) 0.05 % external solution Apply 1 application topically 2 (two) times daily as needed. 11/11/20   Ralene Bathe, MD  fluticasone [facility]) 50 MCG/ACT nasal spray Place 2 sprays into both nostrils daily. 03/04/20   Susy Frizzle, MD  meclizine (ANTIVERT) 25 MG tablet Take 1 tablet (25 mg total) by mouth 3 (three) times daily as needed for dizziness. 02/20/20   Susy Frizzle, MD  mometasone (ELOCON) 0.1 % cream Apply 1 application topically as directed. Qd up to 5 days a week aa rash in buttocks crease 01/13/21   Ralene Bathe, MD  valACYclovir (VALTREX) 1000 MG tablet Take 1 tablet (1,000 mg total) by mouth as directed. Take 2 po with first symptoms of fever blister then 2 po 12 hours later 11/11/20   Ralene Bathe, MD    Allergies    Patient has no known allergies.  Review of Systems   Review of Systems  All other systems reviewed and are negative. Ten systems reviewed and are negative for acute change, except as noted in the HPI.   Physical Exam Updated Vital Signs BP (!) 164/92 (BP Location: Left Arm)   Pulse 70   Temp 98.6 F (37 C) (Oral)   Resp 20   Ht '5\' 11"'$  (1.803 m)   Wt 127 kg   SpO2 100%   BMI 39.05 kg/m   Physical Exam Vitals and nursing Baker reviewed.  Constitutional:      General: He is not in acute distress.    Appearance: Normal appearance. He is not ill-appearing, toxic-appearing or diaphoretic.  HENT:     Head: Normocephalic and atraumatic.     Right Ear: External ear normal.     Left Ear: External ear normal.     Nose: Nose normal.     Mouth/Throat:     Mouth: Mucous membranes are moist.     Pharynx: Oropharynx is clear. No oropharyngeal exudate or posterior oropharyngeal erythema.  Eyes:     Extraocular Movements: Extraocular movements intact.  Cardiovascular:     Rate and  Rhythm: Normal rate and regular rhythm.     Pulses: Normal pulses.     Heart sounds: Normal heart sounds. No murmur heard.   No friction rub. No gallop.  Pulmonary:     Effort: Pulmonary effort is normal. No respiratory distress.     Breath sounds: Normal breath sounds. No stridor. No wheezing, rhonchi or rales.  Abdominal:     General: Abdomen is flat and protuberant.     Palpations: Abdomen is soft.     Tenderness: There is abdominal tenderness.  Comments: Protuberant abdomen that is soft.  Mild to moderate tenderness noted along the right central lateral abdomen as well as the right lower quadrant.  Negative Murphy sign.  Musculoskeletal:        General: Normal range of motion.     Cervical back: Normal range of motion and neck supple. No tenderness.  Skin:    General: Skin is warm and dry.  Neurological:     General: No focal deficit present.     Mental Status: He is alert and oriented to person, place, and time.  Psychiatric:        Mood and Affect: Mood normal.        Behavior: Behavior normal.    ED Results / Procedures / Treatments   Labs (all labs ordered are listed, but only abnormal results are displayed) Labs Reviewed  COMPREHENSIVE METABOLIC PANEL - Abnormal; Notable for the following components:      Result Value   CO2 20 (*)    Glucose, Bld 121 (*)    All other components within normal limits  URINALYSIS, ROUTINE W REFLEX MICROSCOPIC - Abnormal; Notable for the following components:   Hgb urine dipstick SMALL (*)    Bacteria, UA RARE (*)    All other components within normal limits  RESP PANEL BY RT-PCR (FLU A&B, COVID) ARPGX2  LIPASE, BLOOD  CBC   EKG None  Radiology CT ABDOMEN PELVIS W CONTRAST  Result Date: 05/25/2021 CLINICAL DATA:  Abdominal pain, acute, nonlocalized EXAM: CT ABDOMEN AND PELVIS WITH CONTRAST TECHNIQUE: Multidetector CT imaging of the abdomen and pelvis was performed using the standard protocol following bolus administration of  intravenous contrast. CONTRAST:  53m OMNIPAQUE IOHEXOL 350 MG/ML SOLN COMPARISON:  June 2020 FINDINGS: Inferior chest: The lung bases are well-aerated. Hepatobiliary: The liver is normal in size without focal abnormality. Small subcapsular cyst in the posterior right hepatic lobe. No intrahepatic or extrahepatic biliary ductal dilation. The gallbladder appears normal. Spleen: Normal in size without focal abnormality. Pancreas: No pancreatic ductal dilatation or surrounding inflammatory changes. Adrenals/Urinary Tract: Adrenal glands are unremarkable. Kidneys are normal, without renal calculi, focal lesion, or hydronephrosis. Bladder is unremarkable. Stomach/Bowel: The stomach, small bowel and large bowel are normal in caliber without abnormal wall thickening or surrounding inflammatory changes. Sigmoid diverticulosis without active inflammatory changes. The appendix appears decompressed without surrounding inflammation. Reproductive: Prostate is unremarkable. Lymphatic: No enlarged lymph nodes in the abdomen or pelvis. Vasculature: The abdominal aorta is normal in caliber. The portal venous system is patent. Other: No abdominopelvic ascites. Musculoskeletal: Degenerative changes at L4-L5. No aggressive osseous lesions. The soft tissues are unremarkable. IMPRESSION: 1. No acute abnormality in the abdomen or pelvis. 2. Sigmoid diverticulosis without active inflammatory changes. Electronically Signed   By: YAlbin FellingM.D.   On: 05/25/2021 15:24    Procedures Procedures   Medications Ordered in ED Medications  metoCLOPramide (REGLAN) injection 10 mg (10 mg Intravenous Given 05/25/21 1227)  diphenhydrAMINE (BENADRYL) injection 25 mg (25 mg Intravenous Given 05/25/21 1227)  sodium chloride 0.9 % bolus 1,000 mL (0 mLs Intravenous Stopped 05/25/21 1423)  iohexol (OMNIPAQUE) 350 MG/ML injection 100 mL (80 mLs Intravenous Contrast Given 05/25/21 1436)    ED Course  I have reviewed the triage vital signs and the  nursing notes.  Pertinent labs & imaging results that were available during my care of the patient were reviewed by me and considered in my medical decision making (see chart for details).    MDM Rules/Calculators/A&P  Pt is a 50 y.o. male who presents to the emergency department due to abdominal pain with nausea, vomiting, diarrhea  Labs: CBC without abnormalities. CMP with a CO2 of 20 and a glucose of 121. Lipase within normal limits at 26. UA with small hemoglobin and rare bacteria. Respiratory panel is negative.  Imaging: CT scan of the abdomen/pelvis with contrast shows no acute abnormalities in the abdomen or pelvis.  Sigmoid diverticulosis without active inflammatory changes.  I, Rayna Sexton, PA-C, personally reviewed and evaluated these images and lab results as part of my medical decision-making.  Unsure of the source of the patient's symptoms.  Possibly a viral gastroenteritis.  Lab work and imaging today is all reassuring.  CBC without leukocytosis.  No electrolyte derangements on CMP.  Respiratory panel is negative.  Lipase within normal limits.  UA reassuring with only small hemoglobin and rare bacteria.  CT scan of the abdomen/pelvis shows no acute abnormalities.  Feel the patient is stable for discharge at this time and he is agreeable.  We will provide a course of Zofran for breakthrough nausea/vomiting.  Recommend that he follow back up with his gastroenterologist.  He verbalized understanding the above plan.  Given strict return precautions.  His questions were answered and he was amicable at the time of discharge.  Baker: Portions of this report may have been transcribed using voice recognition software. Every effort was made to ensure accuracy; however, inadvertent computerized transcription errors may be present.   Final Clinical Impression(s) / ED Diagnoses Final diagnoses:  Abdominal pain, unspecified abdominal location   Rx / DC  Orders ED Discharge Orders          Ordered    ondansetron (ZOFRAN ODT) 4 MG disintegrating tablet  Every 8 hours PRN        05/25/21 1540             Rayna Sexton, PA-C 05/27/21 1339    Hayden Rasmussen, MD 05/28/21 1336

## 2021-05-25 NOTE — ED Triage Notes (Signed)
Pt reports having N/V/D and diffuse abdominal pain since Sunday with a fever as high as 101.  Rapid COVID tests at home negative.

## 2021-05-26 ENCOUNTER — Encounter: Payer: Self-pay | Admitting: Family Medicine

## 2021-05-26 ENCOUNTER — Ambulatory Visit: Payer: BC Managed Care – PPO | Admitting: Family Medicine

## 2021-05-26 VITALS — BP 142/88 | HR 74 | Temp 97.9°F | Resp 16 | Ht 71.0 in | Wt 280.0 lb

## 2021-05-26 DIAGNOSIS — A084 Viral intestinal infection, unspecified: Secondary | ICD-10-CM | POA: Diagnosis not present

## 2021-05-26 LAB — SARS-COV-2 RNA (COVID-19) RESP VIRAL PNL QL NAAT

## 2021-05-26 NOTE — Progress Notes (Signed)
Subjective:    Patient ID: Darren Baker, male    DOB: Sep 16, 1971, 50 y.o.   MRN: [id number]  Symptoms began on Sunday.  By Monday he had developed a fever, nausea, and vomiting.  Patient was seen by my partner through a video visit.  She performed a COVID test which was negative.  Patient states by Tuesday he was having crampy diffuse abdominal pain in addition to the nausea and vomiting and subjective fevers.  Wednesday he went to the emergency room.  In the emergency room CMP was significant for an elevated sugar.  CBC was normal.  Lipase was normal.  CT scan of the abdomen and pelvis was normal.  He has some trace amount of blood on the urinalysis and rare bacteria seen on microscopic examination.  Patient was given some medicine for nausea.  He is here today for follow-up.  He states that his fevers have subsided.  The nausea and vomiting has subsided.  Previously he was having crampy abdominal pain and lots of gas.  That is also subsided although he is not 100% better.  He questions if this is similar to the event that he had in October 2021.  Past Medical History:  Diagnosis Date   DDD (degenerative disc disease), lumbar    Dysplastic nevus 02/24/2016   L chest - mild    Hypertension    Low back pain    Migraine    Obesity    Post concussion syndrome     Current Outpatient Medications on File Prior to Visit  Medication Sig Dispense Refill   acetaminophen (TYLENOL) 500 MG tablet Take 1,000 mg by mouth every 6 (six) hours as needed for moderate pain.     ALPRAZolam (XANAX) 0.5 MG tablet Take 1 tablet (0.5 mg total) by mouth 3 (three) times daily as needed for anxiety. 90 tablet 0   eletriptan (RELPAX) 40 MG tablet Take 40 mg by mouth daily as needed for migraine. One tablet by mouth at onset of headache. May repeat in 2 hours if headache persists or recurs.     fluocinonide (LIDEX) 0.05 % external solution Apply 1 application topically 2 (two) times daily as needed. 180 mL 3    fluticasone (FLONASE) 50 MCG/ACT nasal spray Place 2 sprays into both nostrils daily. 16 g 6   HYDROcodone-acetaminophen (NORCO/VICODIN) 5-325 MG tablet Take 1 tablet by mouth every 6 (six) hours as needed for moderate pain. 90 tablet 0   hydroxypropyl methylcellulose / hypromellose (ISOPTO TEARS / GONIOVISC) 2.5 % ophthalmic solution Place 1 drop into both eyes daily as needed for dry eyes.     LINZESS 145 MCG CAPS capsule TAKE 1 CAPSULE BY MOUTH DAILY BEFORE BREAKFAST. (Patient taking differently: Take 145 mcg by mouth daily as needed (irritable bowel syndrome).) 30 capsule 11   losartan-hydrochlorothiazide (HYZAAR) 50-12.5 MG tablet TAKE 1 TABLET BY MOUTH EVERY DAY (Patient taking differently: Take 1 tablet by mouth daily.) 90 tablet 3   magnesium oxide (MAG-OX) 400 (241.3 MG) MG tablet Take 400-800 mg by mouth daily.     meclizine (ANTIVERT) 25 MG tablet Take 1 tablet (25 mg total) by mouth 3 (three) times daily as needed for dizziness. 30 tablet 0   meloxicam (MOBIC) 7.5 MG tablet Take 1 tablet (7.5 mg total) by mouth daily. (Patient taking differently: Take 7.5-15 mg by mouth daily.) 30 tablet 5   methocarbamol (ROBAXIN) 500 MG tablet Take 500 mg by mouth every 8 (eight) hours as needed for muscle  spasms.     mometasone (ELOCON) 0.1 % cream Apply 1 application topically as directed. Qd up to 5 days a week aa rash in buttocks crease 50 g 1   ondansetron (ZOFRAN ODT) 4 MG disintegrating tablet Take 1 tablet (4 mg total) by mouth every 8 (eight) hours as needed for nausea or vomiting. 8 tablet 0   tiZANidine (ZANAFLEX) 4 MG capsule Take 4 mg by mouth daily as needed for muscle spasms.     UBRELVY 100 MG TABS Take 100 mg by mouth daily as needed (migraine).     valACYclovir (VALTREX) 1000 MG tablet Take 1 tablet (1,000 mg total) by mouth as directed. Take 2 po with first symptoms of fever blister then 2 po 12 hours later 30 tablet 11   verapamil (CALAN-SR) 240 MG CR tablet Take 240 mg by mouth daily.   3   No current facility-administered medications on file prior to visit.   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  Other Topics Concern   Not on file  Social History Narrative   Not on file   Social Determinants of Health   Financial Resource Strain: Not on file  Food Insecurity: Not on file  Transportation Needs: Not on file  Physical Activity: Not on file  Stress: Not on file  Social Connections: Not on file  Intimate Partner Violence: Not on file      Review of Systems  Neurological:  Positive for dizziness.  All other systems reviewed and are negative.     Objective:   Physical Exam Vitals reviewed.  Constitutional:      General: He is not in acute distress.    Appearance: He is obese. He is not ill-appearing, toxic-appearing or diaphoretic.  HENT:     Head: Normocephalic and atraumatic.  Cardiovascular:     Rate and Rhythm: Normal rate and regular rhythm.     Heart sounds: Normal heart sounds. No murmur heard.   No friction rub. No gallop.  Pulmonary:     Effort: Pulmonary effort is normal. No respiratory distress.     Breath sounds: Normal breath sounds. No stridor. No wheezing or rhonchi.  Abdominal:     General: There is no distension.     Palpations: Abdomen is soft.     Tenderness: There is no abdominal tenderness. There is no guarding.  Neurological:     Mental Status: He is alert.          Assessment & Plan:  Viral gastroenteritis Given the fever, nausea, vomiting, and crampy abdominal pain, I  believe the patient likely had viral gastroenteritis or food poisoning.  I believe that the 2 incidences are too far apart being 1 was in October and the other was in August 10 months later.  I do not believe that they are related.  He is gradually improving and is almost back to his baseline.  Therefore I believe that he likely  had viral gastroenteritis again.  We discussed GI consultation for colonoscopy.  At the present time he is doing better so I do not feel that that is necessary however if he develops recurrent crampy abdominal pain, I will be glad to consult GI.

## 2021-07-14 ENCOUNTER — Ambulatory Visit: Payer: BC Managed Care – PPO | Admitting: Dermatology

## 2021-07-25 ENCOUNTER — Other Ambulatory Visit: Payer: Self-pay

## 2021-07-25 ENCOUNTER — Ambulatory Visit: Payer: BC Managed Care – PPO | Admitting: Family Medicine

## 2021-07-25 ENCOUNTER — Ambulatory Visit: Payer: BC Managed Care – PPO | Admitting: Nurse Practitioner

## 2021-07-25 ENCOUNTER — Other Ambulatory Visit: Payer: Self-pay | Admitting: Family Medicine

## 2021-07-25 ENCOUNTER — Ambulatory Visit (INDEPENDENT_AMBULATORY_CARE_PROVIDER_SITE_OTHER): Payer: BC Managed Care – PPO | Admitting: *Deleted

## 2021-07-25 DIAGNOSIS — Z23 Encounter for immunization: Secondary | ICD-10-CM

## 2021-08-11 ENCOUNTER — Ambulatory Visit: Payer: BC Managed Care – PPO | Admitting: Dermatology

## 2021-08-11 ENCOUNTER — Other Ambulatory Visit: Payer: Self-pay

## 2021-08-11 DIAGNOSIS — L304 Erythema intertrigo: Secondary | ICD-10-CM

## 2021-08-11 DIAGNOSIS — L570 Actinic keratosis: Secondary | ICD-10-CM | POA: Diagnosis not present

## 2021-08-11 DIAGNOSIS — L408 Other psoriasis: Secondary | ICD-10-CM

## 2021-08-11 DIAGNOSIS — L409 Psoriasis, unspecified: Secondary | ICD-10-CM | POA: Diagnosis not present

## 2021-08-11 DIAGNOSIS — D225 Melanocytic nevi of trunk: Secondary | ICD-10-CM

## 2021-08-11 DIAGNOSIS — D492 Neoplasm of unspecified behavior of bone, soft tissue, and skin: Secondary | ICD-10-CM

## 2021-08-11 MED ORDER — TRIAMCINOLONE ACETONIDE 0.147 MG/GM EX AERS: INHALATION_SPRAY | CUTANEOUS | 3 refills | Status: DC

## 2021-08-11 NOTE — Progress Notes (Signed)
Follow-Up Visit   Subjective  Darren Baker is a 50 y.o. male who presents for the following: Actinic Keratosis (Face, 71m f/u), Intertrigo with psoriasis inverse (Groin, gluteal crease not using anything, pt said he never got the [facility] cream), Inversa psoriasis with Intertrigo (Gluteal crease, groin, Mometasone cream prn), and check spots (Face, chest, hands, few months).  The following portions of the chart were reviewed this encounter and updated as appropriate:   Tobacco  Allergies  Meds  Problems  Med Hx  Surg Hx  Fam Hx     Review of Systems:  No other skin or systemic complaints except as noted in HPI or Assessment and Plan.  Objective  Well appearing patient in no apparent distress; mood and affect are within normal limits.  A focused examination was performed including face, chest, groin. Relevant physical exam findings are noted in the Assessment and Plan.  Scalp Minimal scale scalp  R forehead x 1, R lat canthus x 1, R preauricular x 1, R ear x 2, L cheek zygoma x 1 (6) Pink scaly macules   L xyphoid 0.3cm brown macule      Assessment & Plan   Intertrigo With Psoriasis Inversa groin, gluteal crease  Intertrigo is a chronic recurrent rash that occurs in skin fold areas that may be associated with friction; heat; moisture; yeast; fungus; and bacteria.  It is exacerbated by increased movement / activity; sweating; and higher atmospheric temperature.  Recommend starting [facility] Intertrigo mix Iodoquinol: 1%, Hydrocortisone: 2.5%, Niacinamide: 2% cr qd prn flares (script was sent in 01/13/21)  Inverse psoriasis With Intertrigo gluteal crease, groin  Cont Mometasone cr qd up to 5d/wk aa buttocks prn flares  Related Medications mometasone (ELOCON) 0.1 % cream Apply 1 application topically as directed. Qd up to 5 days a week aa rash in buttocks crease  Psoriasis Scalp Psoriasis is a chronic non-curable, but treatable  genetic/hereditary disease that may have other systemic features affecting other organ systems such as joints (Psoriatic Arthritis). It is associated with an increased risk of inflammatory bowel disease, heart disease, non-alcoholic fatty liver disease, and depression.     Start Kenalog spray qd up to 5d/wk prn flares  triamcinolone (KENALOG) 0.147 MG/GM topical spray - Scalp Apply topically as directed. Qd up to 5 days a week to aa psoriasis in scalp, avoid face, groin, axilla  Related Medications fluocinonide (LIDEX) 0.05 % external solution Apply 1 application topically 2 (two) times daily as needed.  AK (actinic keratosis) (6) R forehead x 1, R lat canthus x 1, R preauricular x 1, R ear x 2, L cheek zygoma x 1  Destruction of lesion - R forehead x 1, R lat canthus x 1, R preauricular x 1, R ear x 2, L cheek zygoma x 1 Complexity: simple   Destruction method: cryotherapy   Informed consent: discussed and consent obtained   Timeout:  patient name, date of birth, surgical site, and procedure verified Lesion destroyed using liquid nitrogen: Yes   Region frozen until ice ball extended beyond lesion: Yes   Outcome: patient tolerated procedure well with no complications   Post-procedure details: wound care instructions given    Neoplasm of skin L xyphoid  Epidermal / dermal shaving  Lesion diameter (cm):  0.3 Informed consent: discussed and consent obtained   Timeout: patient name, date of birth, surgical site, and procedure verified   Procedure prep:  Patient was prepped and draped in usual sterile fashion Prep type:  Isopropyl alcohol Anesthesia: the lesion was anesthetized in a standard fashion   Anesthetic:  1% lidocaine w/ epinephrine 1-100,000 buffered w/ 8.4% NaHCO3 Instrument used: flexible razor blade   Hemostasis achieved with: pressure, aluminum chloride and electrodesiccation   Outcome: patient tolerated procedure well   Post-procedure details: sterile dressing applied  and wound care instructions given   Dressing type: bandage and bacitracin    Specimen 1 - Surgical pathology Differential Diagnosis: D48.5 Nevus vs Dysplastic Nevus  Check Margins: yes 0.3cm brown macule  Return in about 6 months (around 02/08/2022) for TBSE, Hx of AKs.  I, Darren Baker, RMA, am acting as scribe for Sarina Ser, MD . Documentation: I have reviewed the above documentation for accuracy and completeness, and I agree with the above.  Sarina Ser, MD

## 2021-08-11 NOTE — Patient Instructions (Addendum)
Instructions for [facility] Medications  One or more of your medications was sent to the [facility] mail order compounding pharmacy. You will receive an email from them and can purchase the medicine through that link. It will then be mailed to your home at the address you confirmed. If for any reason you do not receive an email from them, please check your spam folder. If you still do not find the email, please let us know. [facility] phone number is [phone].   If you have any questions or concerns for your doctor, please call our main line at [phone] and press option 4 to reach your doctor's medical assistant. If no one answers, please leave a voicemail as directed and we will return your call as soon as possible. Messages left after 4 pm will be answered the following business day.   You may also send us a message via MyChart. We typically respond to MyChart messages within 1-2 business days.  For prescription refills, please ask your pharmacy to contact our office. Our fax number is [phone].  If you have an urgent issue when the clinic is closed that cannot wait until the next business day, you can page your doctor at the number below.    Please note that while we do our best to be available for urgent issues outside of office hours, we are not available 24/7.   If you have an urgent issue and are unable to reach us, you may choose to seek medical care at your doctor's office, retail clinic, urgent care center, or emergency room.  If you have a medical emergency, please immediately call 911 or go to the emergency department.  Pager Numbers  - Dr. Kowalski: [phone]  - Dr. Moye: [phone]  - Dr. Stewart: [phone]  In the event of inclement weather, please call our main line at [phone] for an update on the status of any delays or closures.  Dermatology Medication Tips: Please keep the boxes that topical medications come in in order to help  keep track of the instructions about where and how to use these. Pharmacies typically print the medication instructions only on the boxes and not directly on the medication tubes.   If your medication is too expensive, please contact our office at [phone] option 4 or send us a message through MyChart.   We are unable to tell what your co-pay for medications will be in advance as this is different depending on your insurance coverage. However, we may be able to find a substitute medication at lower cost or fill out paperwork to get insurance to cover a needed medication.   If a prior authorization is required to get your medication covered by your insurance company, please allow us 1-2 business days to complete this process.  Drug prices often vary depending on where the prescription is filled and some pharmacies may offer cheaper prices.  The website www.goodrx.com contains coupons for medications through different pharmacies. The prices here do not account for what the cost may be with help from insurance (it may be cheaper with your insurance), but the website can give you the price if you did not use any insurance.  - You can print the associated coupon and take it with your prescription to the pharmacy.  - You may also stop by our office during regular business hours and pick up a GoodRx coupon card.  - If you need your prescription sent electronically to a different pharmacy, notify our office   through [facility] or by phone at [phone] option 4.   Intertrigo is a chronic recurrent rash that occurs in skin fold areas that may be associated with friction; heat; moisture; yeast; fungus; and bacteria.  It is exacerbated by increased movement / activity; sweating; and higher atmospheric temperature.  Start the [facility] Intertrigo cream nightly as needed for flares.  Wound Care Instructions  Cleanse wound gently with soap and water once a day then pat dry with clean  gauze. Apply a thing coat of Petrolatum (petroleum jelly, "Vaseline") over the wound (unless you have an allergy to this). We recommend that you use a new, sterile tube of Vaseline. Do not pick or remove scabs. Do not remove the yellow or white "healing tissue" from the base of the wound.  Cover the wound with fresh, clean, nonstick gauze and secure with paper tape. You may use Band-Aids in place of gauze and tape if the would is small enough, but would recommend trimming much of the tape off as there is often too much. Sometimes Band-Aids can irritate the skin.  You should call the office for your biopsy report after 1 week if you have not already been contacted.  If you experience any problems, such as abnormal amounts of bleeding, swelling, significant bruising, significant pain, or evidence of infection, please call the office immediately.  FOR ADULT SURGERY PATIENTS: If you need something for pain relief you may take 1 extra strength Tylenol (acetaminophen) AND 2 Ibuprofen (200mg  each) together every 4 hours as needed for pain. (do not take these if you are allergic to them or if you have a reason you should not take them.) Typically, you may only need pain medication for 1 to 3 days.

## 2021-08-16 ENCOUNTER — Encounter: Payer: Self-pay | Admitting: Dermatology

## 2021-08-19 ENCOUNTER — Telehealth: Payer: Self-pay

## 2021-08-19 NOTE — Telephone Encounter (Signed)
-----   Message from Ralene Bathe, MD sent at 08/15/2021 10:22 AM EST ----- Diagnosis Skin , left xyphoid DYSPLASTIC COMPOUND NEVUS WITH MODERATE ATYPIA, DEEP MARGIN INVOLVED  Moderate dysplastic Recheck next visit

## 2021-08-19 NOTE — Telephone Encounter (Signed)
Unable to leave a message.

## 2021-08-20 ENCOUNTER — Other Ambulatory Visit: Payer: Self-pay | Admitting: Family Medicine

## 2021-08-20 DIAGNOSIS — I10 Essential (primary) hypertension: Secondary | ICD-10-CM

## 2021-08-23 ENCOUNTER — Telehealth: Payer: Self-pay

## 2021-08-23 NOTE — Telephone Encounter (Signed)
-----   Message from Ralene Bathe, MD sent at 08/15/2021 10:22 AM EST ----- Diagnosis Skin , left xyphoid DYSPLASTIC COMPOUND NEVUS WITH MODERATE ATYPIA, DEEP MARGIN INVOLVED  Moderate dysplastic Recheck next visit

## 2021-08-23 NOTE — Telephone Encounter (Signed)
Patient read results on MyChart. Left message to advise him to call if he has any questions and keep follow up appointment with Dr. Sherryll Burger

## 2021-09-22 ENCOUNTER — Ambulatory Visit: Payer: BC Managed Care – PPO | Admitting: Nurse Practitioner

## 2021-09-22 ENCOUNTER — Encounter: Payer: Self-pay | Admitting: Nurse Practitioner

## 2021-09-22 ENCOUNTER — Other Ambulatory Visit: Payer: Self-pay

## 2021-09-22 VITALS — BP 140/80 | HR 78 | Temp 97.5°F | Ht 71.0 in | Wt 283.0 lb

## 2021-09-22 DIAGNOSIS — H01005 Unspecified blepharitis left lower eyelid: Secondary | ICD-10-CM

## 2021-09-22 NOTE — Progress Notes (Signed)
Subjective:    Patient ID: Darren Baker, male    DOB: May 01, 1971, 50 y.o.   MRN: [id number]  HPI: Darren Baker is a 50 y.o. male presenting for  Chief Complaint  Patient presents with   left eye stye    X 1 day, [facility] was prescribed medications , wife has shingles   EYE PAIN Patient eye pain and discharge started yesterday morning.  Reports there is pain under the eye but not in the eye. Wife works at [employer] - was sent home yesterday with shingles.   He is worried this is shingles in his eye.  He went to [place] clinic last night and was started on antibiotics, eye drops, and given a prescription for Valtrex. Duration:  days Involved eye:  left Onset: sudden Severity: 4/10 Quality: throbbing Foreign body sensation:no Visual impairment: haze because of film Eye redness: yes Discharge: yes Crusting or matting of eyelids: no Swelling: yes Photophobia: no Itching: no Tearing: no Headache: no Floaters: no URI symptoms: no Contact lens use: no Close contacts with similar problems: no Eye trauma: no  No Known Allergies  Outpatient Encounter Medications as of 09/22/2021  Medication Sig   acetaminophen (TYLENOL) 500 MG tablet Take 1,000 mg by mouth every 6 (six) hours as needed for moderate pain.   ALPRAZolam (XANAX) 0.5 MG tablet Take 1 tablet (0.5 mg total) by mouth 3 (three) times daily as needed for anxiety.   cefadroxil (DURICEF) 500 MG capsule Take 500 mg by mouth 2 (two) times daily.   eletriptan (RELPAX) 40 MG tablet Take 40 mg by mouth daily as needed for migraine. One tablet by mouth at onset of headache. May repeat in 2 hours if headache persists or recurs.   HYDROcodone-acetaminophen (NORCO/VICODIN) 5-325 MG tablet Take 1 tablet by mouth every 6 (six) hours as needed for moderate pain.   LINZESS 145 MCG CAPS capsule TAKE 1 CAPSULE BY MOUTH DAILY BEFORE BREAKFAST.   losartan-hydrochlorothiazide (HYZAAR) 50-12.5 MG tablet TAKE 1 TABLET BY MOUTH EVERY DAY    magnesium oxide (MAG-OX) 400 (241.3 MG) MG tablet Take 400-800 mg by mouth daily.   meloxicam (MOBIC) 7.5 MG tablet Take 1 tablet (7.5 mg total) by mouth daily. (Patient taking differently: Take 7.5-15 mg by mouth daily.)   methocarbamol (ROBAXIN) 500 MG tablet Take 500 mg by mouth every 8 (eight) hours as needed for muscle spasms.   sulfacetamide (BLEPH-10) 10 % ophthalmic solution SMARTSIG:In Eye(s)   tiZANidine (ZANAFLEX) 4 MG capsule Take 4 mg by mouth daily as needed for muscle spasms.   UBRELVY 100 MG TABS Take 100 mg by mouth daily as needed (migraine).   verapamil (CALAN-SR) 240 MG CR tablet Take 240 mg by mouth daily.   fluocinonide (LIDEX) 0.05 % external solution Apply 1 application topically 2 (two) times daily as needed. (Patient not taking: Reported on 09/22/2021)   hydroxypropyl methylcellulose / hypromellose (ISOPTO TEARS / GONIOVISC) 2.5 % ophthalmic solution Place 1 drop into both eyes daily as needed for dry eyes. (Patient not taking: Reported on 09/22/2021)   mometasone (ELOCON) 0.1 % cream Apply 1 application topically as directed. Qd up to 5 days a week aa rash in buttocks crease (Patient not taking: Reported on 09/22/2021)   triamcinolone (KENALOG) 0.147 MG/GM topical spray Apply topically as directed. Qd up to 5 days a week to aa psoriasis in scalp, avoid face, groin, axilla (Patient not taking: Reported on 09/22/2021)   valACYclovir (VALTREX) 1000 MG tablet Take 1  tablet (1,000 mg total) by mouth as directed. Take 2 po with first symptoms of fever blister then 2 po 12 hours later (Patient not taking: Reported on 09/22/2021)   [DISCONTINUED] fluticasone (FLONASE) 50 MCG/ACT nasal spray Place 2 sprays into both nostrils daily.   [DISCONTINUED] meclizine (ANTIVERT) 25 MG tablet Take 1 tablet (25 mg total) by mouth 3 (three) times daily as needed for dizziness.   [DISCONTINUED] ondansetron (ZOFRAN ODT) 4 MG disintegrating tablet Take 1 tablet (4 mg total) by mouth every 8  (eight) hours as needed for nausea or vomiting.   No facility-administered encounter medications on file as of 09/22/2021.    Patient Active Problem List   Diagnosis Date Noted   Migraine    Low back pain    Obesity    DDD (degenerative disc disease), lumbar    Post concussion syndrome    Hypertension     Past Medical History:  Diagnosis Date   DDD (degenerative disc disease), lumbar    Dysplastic nevus 02/24/2016   L chest - mild    Dysplastic nevus 08/11/2021   Left xyphoid - moderate   Hypertension    Low back pain    Migraine    Obesity    Post concussion syndrome     Relevant past medical, surgical, family and social history reviewed and updated as indicated. Interim medical history since our last visit reviewed.  Review of Systems Per HPI unless specifically indicated above     Objective:    BP 140/80    Pulse 78    Temp (!) 97.5 F (36.4 C)    Ht 5\' 11"  (1.803 m)    Wt 283 lb (128.4 kg)    SpO2 100%    BMI 39.47 kg/m   Wt Readings from Last 3 Encounters:  09/22/21 283 lb (128.4 kg)  05/26/21 280 lb (127 kg)  05/25/21 280 lb (127 kg)    Physical Exam Vitals and nursing note reviewed.  Constitutional:      General: He is not in acute distress.    Appearance: Normal appearance. He is not toxic-appearing.  HENT:     Head: Normocephalic and atraumatic.     Nose: Nose normal. No congestion.  Eyes:     General: No scleral icterus.       Left eye: Discharge present.    Extraocular Movements: Extraocular movements intact.     Right eye: Normal extraocular motion.     Left eye: Normal extraocular motion.     Conjunctiva/sclera:     Right eye: Right conjunctiva is not injected. No exudate.    Left eye: Left conjunctiva is injected. Exudate present.     Pupils: Pupils are equal, round, and reactive to light.      Comments: Stye to left eye, periorbital swelling and slight erythema inferior to left eye  Skin:    General: Skin is warm and dry.     Capillary  Refill: Capillary refill takes less than 2 seconds.     Coloration: Skin is not jaundiced or pale.  Neurological:     Mental Status: He is alert and oriented to person, place, and time.     Motor: No weakness.     Gait: Gait normal.  Psychiatric:        Mood and Affect: Mood normal.        Behavior: Behavior normal.        Thought Content: Thought content normal.        Judgment:  Judgment normal.      Assessment & Plan:  1. Blepharitis of left lower eyelid, unspecified type Acute.  Agree with treatment plan from urgent care - sulfacetamide eye drops, duricef for periorbital cellulitis.  Continue warm compresses.  Follow up if symptoms worsen.   Reassurance provided surrounding shingles exposure.  Follow up plan: Return if symptoms worsen or fail to improve.

## 2021-10-14 ENCOUNTER — Encounter: Payer: Self-pay | Admitting: Family Medicine

## 2021-10-14 ENCOUNTER — Other Ambulatory Visit: Payer: Self-pay

## 2021-10-14 ENCOUNTER — Ambulatory Visit: Payer: BC Managed Care – PPO | Admitting: Family Medicine

## 2021-10-14 VITALS — BP 138/76 | HR 87 | Temp 97.6°F | Resp 18 | Ht 71.0 in | Wt 282.6 lb

## 2021-10-14 DIAGNOSIS — H01005 Unspecified blepharitis left lower eyelid: Secondary | ICD-10-CM

## 2021-10-14 MED ORDER — SILDENAFIL CITRATE 100 MG PO TABS: 50.0000 mg | ORAL_TABLET | Freq: Every day | ORAL | 11 refills | Status: AC | PRN

## 2021-10-14 MED ORDER — ALPRAZOLAM 0.5 MG PO TABS: 0.5000 mg | ORAL_TABLET | Freq: Three times a day (TID) | ORAL | 0 refills | Status: DC | PRN

## 2021-10-14 NOTE — Progress Notes (Signed)
Subjective:    Patient ID: Darren Baker, male    DOB: 06/08/1971, 51 y.o.   MRN: [id number]  HPI  Patient was recently seen in urgent care for blepharitis.  He was started on cephalosporin biotic cellulitis and was given Bleph-10 eyedrops for blepharitis.  He had a stye in his left lower eyelid that had become infected and progressed to swelling on his lower eyelid and to the surrounding tissue around the eye.  The swelling around the eye has completely resolved.  The erythema has completely resolved.  There is still some residual swelling in the left lower eyelid and there is still a visible stye that he states is occasionally draining yellow fluid. Past Medical History:  Diagnosis Date   DDD (degenerative disc disease), lumbar    Dysplastic nevus 02/24/2016   L chest - mild    Dysplastic nevus 08/11/2021   Left xyphoid - moderate   Hypertension    Low back pain    Migraine    Obesity    Post concussion syndrome    History reviewed. No pertinent surgical history. Current Outpatient Medications on File Prior to Visit  Medication Sig Dispense Refill   acetaminophen (TYLENOL) 500 MG tablet Take 1,000 mg by mouth every 6 (six) hours as needed for moderate pain.     cefadroxil (DURICEF) 500 MG capsule Take 500 mg by mouth 2 (two) times daily.     eletriptan (RELPAX) 40 MG tablet Take 40 mg by mouth daily as needed for migraine. One tablet by mouth at onset of headache. May repeat in 2 hours if headache persists or recurs.     fluocinonide (LIDEX) 0.05 % external solution Apply 1 application topically 2 (two) times daily as needed. 180 mL 3   HYDROcodone-acetaminophen (NORCO/VICODIN) 5-325 MG tablet Take 1 tablet by mouth every 6 (six) hours as needed for moderate pain. 90 tablet 0   hydroxypropyl methylcellulose / hypromellose (ISOPTO TEARS / GONIOVISC) 2.5 % ophthalmic solution Place 1 drop into both eyes daily as needed for dry eyes.     LINZESS 145 MCG CAPS capsule TAKE 1 CAPSULE  BY MOUTH DAILY BEFORE BREAKFAST. 30 capsule 11   losartan-hydrochlorothiazide (HYZAAR) 50-12.5 MG tablet TAKE 1 TABLET BY MOUTH EVERY DAY 90 tablet 3   magnesium oxide (MAG-OX) 400 (241.3 MG) MG tablet Take 400-800 mg by mouth daily.     meloxicam (MOBIC) 7.5 MG tablet Take 1 tablet (7.5 mg total) by mouth daily. (Patient taking differently: Take 7.5-15 mg by mouth daily.) 30 tablet 5   methocarbamol (ROBAXIN) 500 MG tablet Take 500 mg by mouth every 8 (eight) hours as needed for muscle spasms.     mometasone (ELOCON) 0.1 % cream Apply 1 application topically as directed. Qd up to 5 days a week aa rash in buttocks crease 50 g 1   sulfacetamide (BLEPH-10) 10 % ophthalmic solution SMARTSIG:In Eye(s)     tiZANidine (ZANAFLEX) 4 MG tablet Take 4-8 mg by mouth at bedtime as needed.     triamcinolone (KENALOG) 0.147 MG/GM topical spray Apply topically as directed. Qd up to 5 days a week to aa psoriasis in scalp, avoid face, groin, axilla 63 g 3   UBRELVY 100 MG TABS Take 100 mg by mouth daily as needed (migraine).     valACYclovir (VALTREX) 1000 MG tablet Take 1 tablet (1,000 mg total) by mouth as directed. Take 2 po with first symptoms of fever blister then 2 po 12 hours later 30 tablet 11  verapamil (CALAN-SR) 240 MG CR tablet Take 240 mg by mouth daily.  3   No current facility-administered medications on file prior to visit.   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  Other Topics Concern   Not on file  Social History Narrative   Not on file    Social Determinants of Health   Financial Resource Strain: Not on file  Food Insecurity: Not on file  Transportation Needs: Not on file  Physical Activity: Not on file  Stress: Not on file  Social Connections: Not on file  Intimate Partner Violence: Not on file      Review of Systems  All other systems reviewed and are negative.     Objective:   Physical Exam Constitutional:      Appearance: Normal appearance.  Eyes:     General:        Left eye: Hordeolum present.    Conjunctiva/sclera:     Right eye: No exudate or hemorrhage.    Left eye: Left conjunctiva is not injected. No exudate or hemorrhage.  Neurological:     Mental Status: He is alert.          Assessment & Plan:  Blepharitis of left lower eyelid, unspecified type Recommended the patient complete the Bleph-10 eyedrops that he was prescribed.  Recommended that he use warm compresses 2-3 times a day to the lower eyelid to facilitate drainage of the stye.  Anticipate gradual resolution over the next week.

## 2021-10-20 ENCOUNTER — Other Ambulatory Visit: Payer: Self-pay | Admitting: Family Medicine

## 2021-10-31 ENCOUNTER — Ambulatory Visit: Payer: BC Managed Care – PPO | Admitting: Family Medicine

## 2021-11-07 ENCOUNTER — Encounter: Payer: Self-pay | Admitting: Family Medicine

## 2021-11-07 ENCOUNTER — Ambulatory Visit: Payer: BC Managed Care – PPO | Admitting: Family Medicine

## 2021-11-07 ENCOUNTER — Other Ambulatory Visit: Payer: Self-pay

## 2021-11-07 VITALS — BP 128/82 | HR 68 | Temp 97.4°F | Resp 18 | Ht 71.0 in | Wt 282.0 lb

## 2021-11-07 DIAGNOSIS — M7751 Other enthesopathy of right foot: Secondary | ICD-10-CM | POA: Diagnosis not present

## 2021-11-07 MED ORDER — PREDNISONE 20 MG PO TABS: ORAL_TABLET | ORAL | 0 refills | Status: DC

## 2021-11-07 NOTE — Progress Notes (Signed)
Subjective:    Patient ID: Darren Baker, male    DOB: 1971-05-15, 51 y.o.   MRN: [id number]   Patient is a very pleasant 51 year old Caucasian gentleman who presents today with pain in his right foot.  There is a collection of fluid just adjacent to the right fifth MTP joint.  There are some tenderness to palpation over the MTP joint and there is subcutaneous swelling that is palpable.  This is mild to moderate.  There is no erythema.  The patient denies any injury although there is significant wear marks inside his shoe where his shoe is constantly rubbing up against the side of his fifth MTP joint.  I believe that this is either an effusion in the fifth MTP joint or possibly a bursitis.  There is no erythema or warmth or fever.  Pain is gradually improving.  There is no skin breakdown or erythema or blistering  Patient also had COVID 1 week ago.  He has been fully vaccinated.  He has no residual fever or hemoptysis or pleurisy or shortness of breath.  Does occasionally have some upper airway congestion Past Medical History:  Diagnosis Date   DDD (degenerative disc disease), lumbar    Dysplastic nevus 02/24/2016   L chest - mild    Dysplastic nevus 08/11/2021   Left xyphoid - moderate   Hypertension    Low back pain    Migraine    Obesity    Post concussion syndrome     Current Outpatient Medications on File Prior to Visit  Medication Sig Dispense Refill   acetaminophen (TYLENOL) 500 MG tablet Take 1,000 mg by mouth every 6 (six) hours as needed for moderate pain.     ALPRAZolam (XANAX) 0.5 MG tablet Take 1 tablet (0.5 mg total) by mouth 3 (three) times daily as needed for anxiety. 90 tablet 0   cefadroxil (DURICEF) 500 MG capsule Take 500 mg by mouth 2 (two) times daily.     eletriptan (RELPAX) 40 MG tablet Take 40 mg by mouth daily as needed for migraine. One tablet by mouth at onset of headache. May repeat in 2 hours if headache persists or recurs.     fluocinonide (LIDEX) 0.05  % external solution Apply 1 application topically 2 (two) times daily as needed. 180 mL 3   HYDROcodone-acetaminophen (NORCO/VICODIN) 5-325 MG tablet Take 1 tablet by mouth every 6 (six) hours as needed for moderate pain. 90 tablet 0   hydroxypropyl methylcellulose / hypromellose (ISOPTO TEARS / GONIOVISC) 2.5 % ophthalmic solution Place 1 drop into both eyes daily as needed for dry eyes.     LINZESS 145 MCG CAPS capsule TAKE 1 CAPSULE BY MOUTH DAILY BEFORE BREAKFAST. 30 capsule 11   losartan-hydrochlorothiazide (HYZAAR) 50-12.5 MG tablet TAKE 1 TABLET BY MOUTH EVERY DAY 90 tablet 3   magnesium oxide (MAG-OX) 400 (241.3 MG) MG tablet Take 400-800 mg by mouth daily.     meloxicam (MOBIC) 7.5 MG tablet Take 1 tablet (7.5 mg total) by mouth daily. (Patient taking differently: Take 7.5-15 mg by mouth daily.) 30 tablet 5   methocarbamol (ROBAXIN) 500 MG tablet Take 500 mg by mouth every 8 (eight) hours as needed for muscle spasms.     mometasone (ELOCON) 0.1 % cream Apply 1 application topically as directed. Qd up to 5 days a week aa rash in buttocks crease 50 g 1   sildenafil (VIAGRA) 100 MG tablet Take 0.5-1 tablets (50-100 mg total) by mouth daily as needed for  erectile dysfunction. 5 tablet 11   sulfacetamide (BLEPH-10) 10 % ophthalmic solution SMARTSIG:In Eye(s)     tiZANidine (ZANAFLEX) 4 MG tablet Take 4-8 mg by mouth at bedtime as needed.     triamcinolone (KENALOG) 0.147 MG/GM topical spray Apply topically as directed. Qd up to 5 days a week to aa psoriasis in scalp, avoid face, groin, axilla 63 g 3   UBRELVY 100 MG TABS Take 100 mg by mouth daily as needed (migraine).     valACYclovir (VALTREX) 1000 MG tablet Take 1 tablet (1,000 mg total) by mouth as directed. Take 2 po with first symptoms of fever blister then 2 po 12 hours later 30 tablet 11   verapamil (CALAN-SR) 240 MG CR tablet Take 240 mg by mouth daily.  3   No current facility-administered medications on file prior to visit.   No  Known Allergies Social History   Socioeconomic History   Marital status: Single    Spouse name: Not on file   Number of children: Not on file   Years of education: Not on file   Highest education level: Not on file  Occupational History   Not on file  Tobacco Use   Smoking status: Never   Smokeless tobacco: Never  Substance and Sexual Activity   Alcohol use: Yes    Comment: Rare   Drug use: No   Sexual activity: Not on file    Comment: Married, works for [employer].  Other Topics Concern   Not on file  Social History Narrative   Not on file   Social Determinants of Health   Financial Resource Strain: Not on file  Food Insecurity: Not on file  Transportation Needs: Not on file  Physical Activity: Not on file  Stress: Not on file  Social Connections: Not on file  Intimate Partner Violence: Not on file      Review of Systems  All other systems reviewed and are negative.     Objective:   Physical Exam Vitals reviewed.  Constitutional:      General: He is not in acute distress.    Appearance: He is obese. He is not ill-appearing, toxic-appearing or diaphoretic.  HENT:     Head: Normocephalic and atraumatic.  Cardiovascular:     Rate and Rhythm: Normal rate and regular rhythm.     Pulses:          Dorsalis pedis pulses are 2+ on the right side.     Heart sounds: Normal heart sounds. No murmur heard.   No friction rub. No gallop.  Pulmonary:     Effort: Pulmonary effort is normal. No respiratory distress.     Breath sounds: Normal breath sounds. No stridor. No wheezing or rhonchi.  Abdominal:     General: There is no distension.     Palpations: Abdomen is soft.     Tenderness: There is no abdominal tenderness. There is no guarding.  Musculoskeletal:     Right foot: Normal range of motion. No prominent metatarsal heads.       Feet:  Feet:     Right foot:     Skin integrity: No ulcer, blister, skin breakdown, erythema, warmth, callus or dry skin.   Neurological:     Mental Status: He is alert.          Assessment & Plan:  Bursitis of right foot There is no evidence of any fracture or dislocation or infection or erythema or warmth.  I believe that this could  either represent an effusion around the fifth MTP joint/capsulitis or possibly a bursitis due to pressure and friction.  I recommended trying a prednisone taper pack and getting a wider toe box in his shoe to avoid friction and pressure.  Anticipate gradual improvement over the next week.  Proceed with x-rays if not improving.  I believe the patient is cleared to return to work.

## 2021-12-18 IMAGING — CT CT ABD-PELV W/ CM
2 of 5 series · 16 of 46 positions shown, 18 images · IV contrast (omnipaque)
Comparison: March 2019

CLINICAL DATA: Abdominal pain, acute, nonlocalized

EXAM:
CT ABDOMEN AND PELVIS WITH CONTRAST
TECHNIQUE: Multidetector CT imaging of the abdomen and pelvis was performed
using the standard protocol following bolus administration of
intravenous contrast.
CONTRAST:  80mL OMNIPAQUE IOHEXOL 350 MG/ML SOLN

[Series 2: axial st · axial · 0.90mm/px · z∈[+1142,+1617]mm · 13 of 111 slices shown, 15 images]
[im 8/111  soft-tissue]
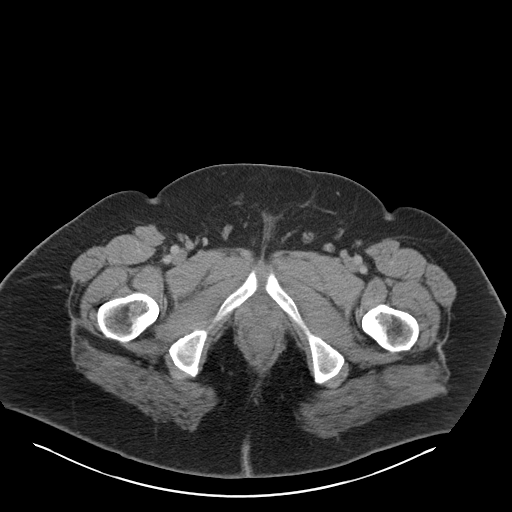
[im 8/111  bone]
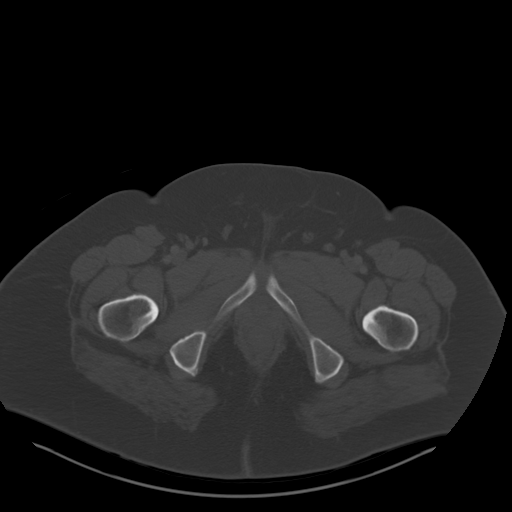
[im 16/111  soft-tissue]
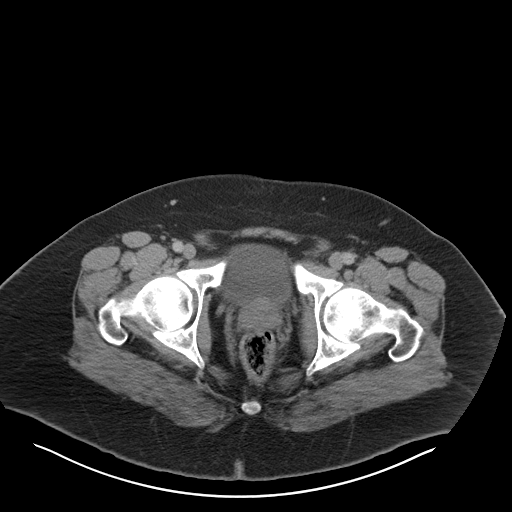
[im 24/111  soft-tissue]
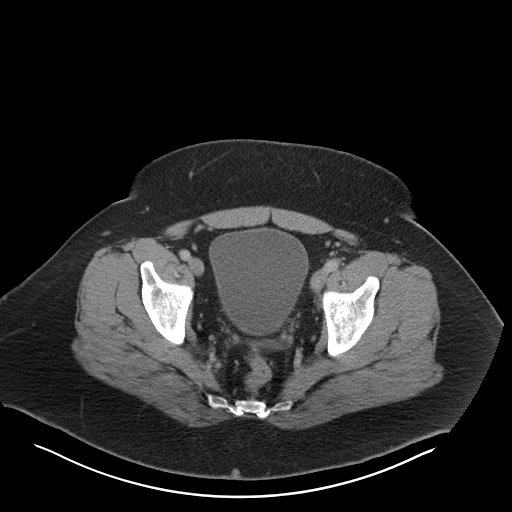
[im 32/111  soft-tissue]
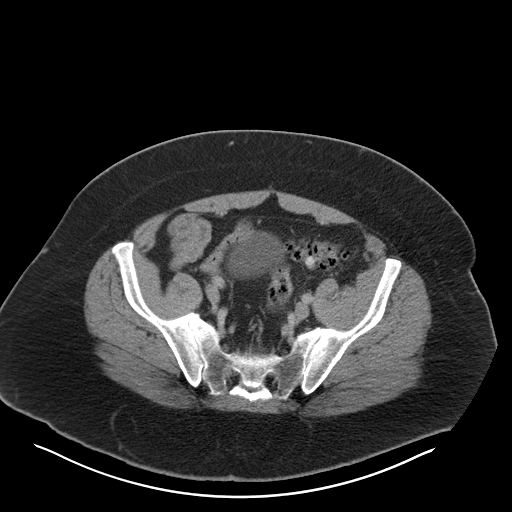
[im 40/111  soft-tissue]
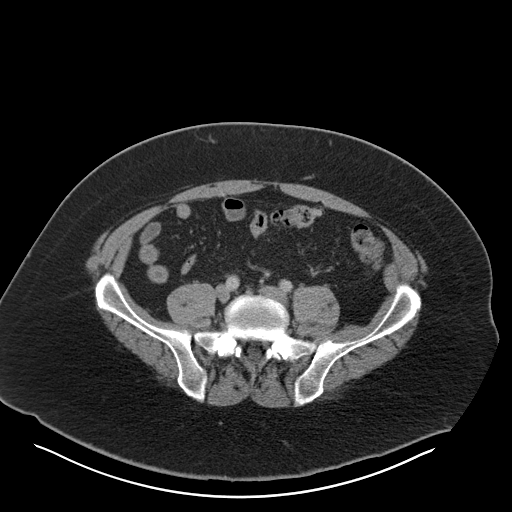
[im 48/111  soft-tissue]
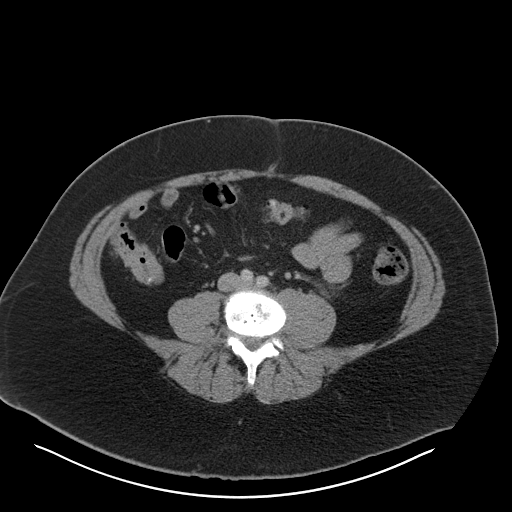
[im 56/111  soft-tissue]
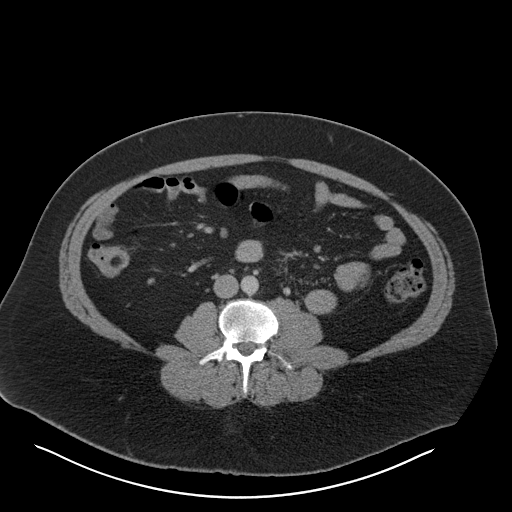
[im 63/111  soft-tissue]
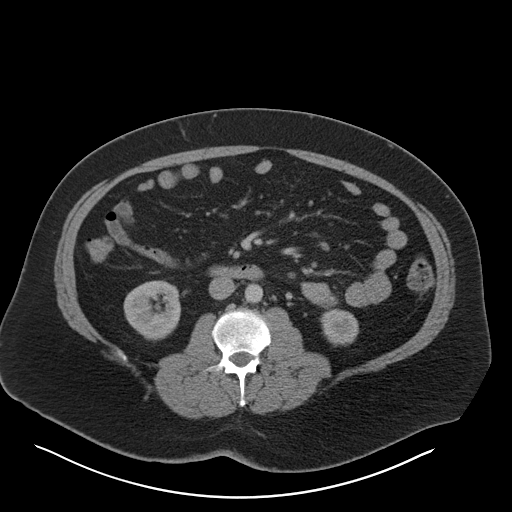
[im 71/111  soft-tissue]
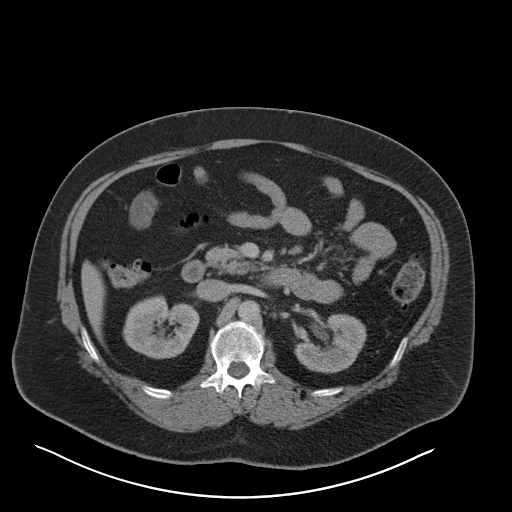
[im 71/111  bone]
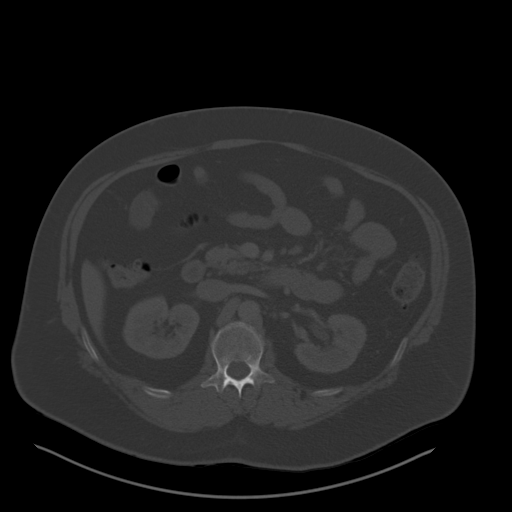
[im 79/111  soft-tissue]
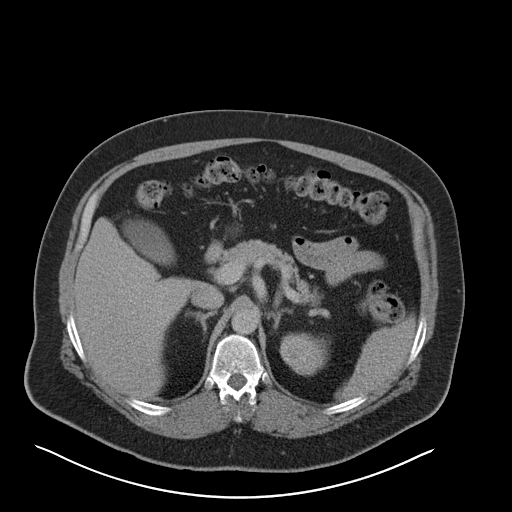
[im 87/111  soft-tissue]
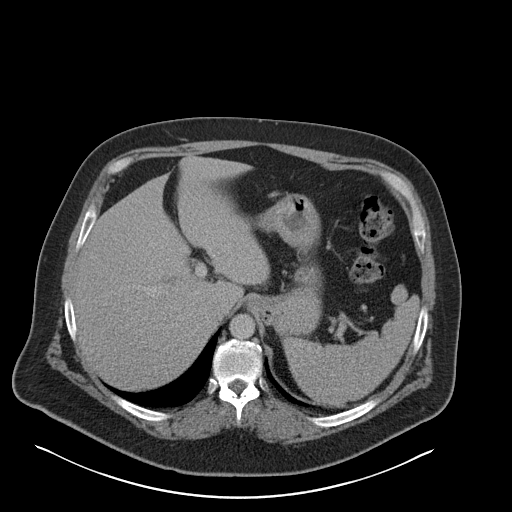
[im 95/111  soft-tissue]
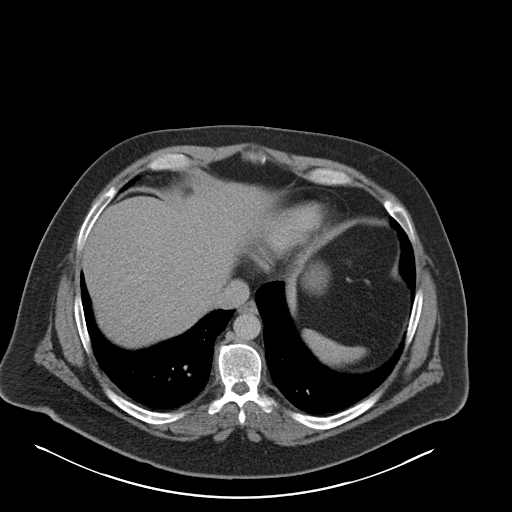
[im 103/111  soft-tissue]
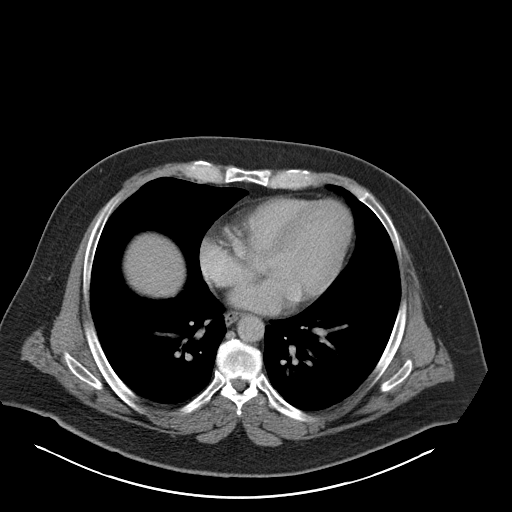

[Series 5: coronal st · coronal · 1.05mm/px · 3 of 139 slices shown]
[im 47/139  soft-tissue]
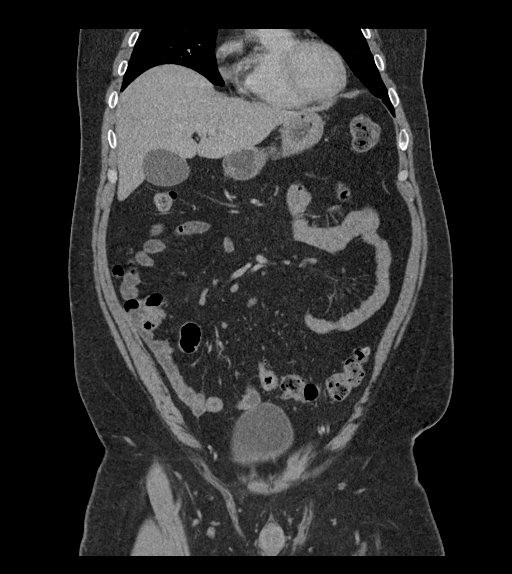
[im 62/139  soft-tissue]
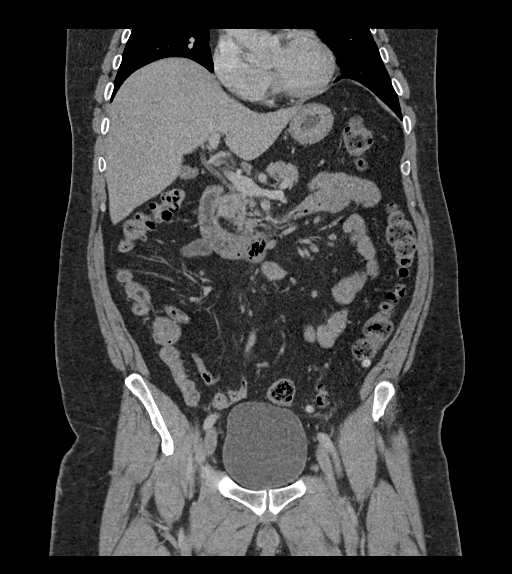
[im 77/139  soft-tissue]
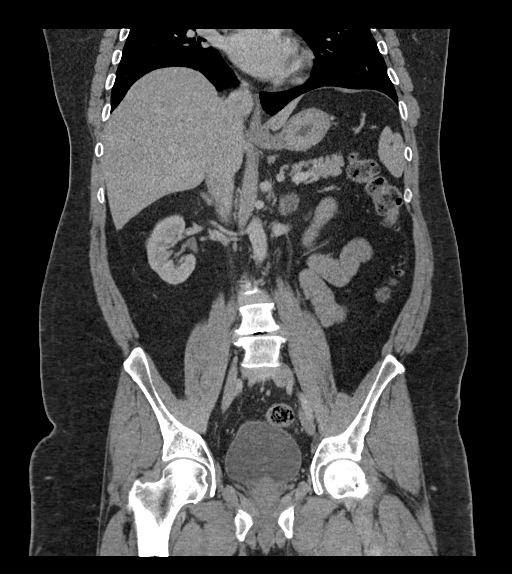

[16 of 46 positions shown; findings below may reference images not displayed]

FINDINGS: Inferior chest: The lung bases are well-aerated.

Hepatobiliary: The liver is normal in size without focal
abnormality. Small subcapsular cyst in the posterior right hepatic
lobe. No intrahepatic or extrahepatic biliary ductal dilation. The
gallbladder appears normal.

Spleen: Normal in size without focal abnormality.

Pancreas: No pancreatic ductal dilatation or surrounding
inflammatory changes.

Adrenals/Urinary Tract: Adrenal glands are unremarkable. Kidneys are
normal, without renal calculi, focal lesion, or hydronephrosis.
Bladder is unremarkable.

Stomach/Bowel: The stomach, small bowel and large bowel are normal
in caliber without abnormal wall thickening or surrounding
inflammatory changes. Sigmoid diverticulosis without active
inflammatory changes. The appendix appears decompressed without
surrounding inflammation.

Reproductive: Prostate is unremarkable.

Lymphatic: No enlarged lymph nodes in the abdomen or pelvis.

Vasculature: The abdominal aorta is normal in caliber. The portal
venous system is patent.

Other: No abdominopelvic ascites.

Musculoskeletal: Degenerative changes at L4-L5. No aggressive
osseous lesions. The soft tissues are unremarkable.
IMPRESSION: 1. No acute abnormality in the abdomen or pelvis.
2. Sigmoid diverticulosis without active inflammatory changes.

## 2022-01-05 ENCOUNTER — Ambulatory Visit: Payer: BC Managed Care – PPO | Admitting: Dermatology

## 2022-01-05 DIAGNOSIS — Z1283 Encounter for screening for malignant neoplasm of skin: Secondary | ICD-10-CM

## 2022-01-05 DIAGNOSIS — L918 Other hypertrophic disorders of the skin: Secondary | ICD-10-CM

## 2022-01-05 DIAGNOSIS — Z872 Personal history of diseases of the skin and subcutaneous tissue: Secondary | ICD-10-CM

## 2022-01-05 DIAGNOSIS — L304 Erythema intertrigo: Secondary | ICD-10-CM | POA: Diagnosis not present

## 2022-01-05 DIAGNOSIS — L821 Other seborrheic keratosis: Secondary | ICD-10-CM

## 2022-01-05 DIAGNOSIS — L814 Other melanin hyperpigmentation: Secondary | ICD-10-CM

## 2022-01-05 DIAGNOSIS — Z86018 Personal history of other benign neoplasm: Secondary | ICD-10-CM

## 2022-01-05 DIAGNOSIS — L720 Epidermal cyst: Secondary | ICD-10-CM

## 2022-01-05 DIAGNOSIS — L409 Psoriasis, unspecified: Secondary | ICD-10-CM

## 2022-01-05 DIAGNOSIS — L249 Irritant contact dermatitis, unspecified cause: Secondary | ICD-10-CM

## 2022-01-05 DIAGNOSIS — L408 Other psoriasis: Secondary | ICD-10-CM | POA: Diagnosis not present

## 2022-01-05 DIAGNOSIS — L578 Other skin changes due to chronic exposure to nonionizing radiation: Secondary | ICD-10-CM

## 2022-01-05 DIAGNOSIS — D1800 Hemangioma unspecified site: Secondary | ICD-10-CM

## 2022-01-05 DIAGNOSIS — D229 Melanocytic nevi, unspecified: Secondary | ICD-10-CM

## 2022-01-05 DIAGNOSIS — M1990 Unspecified osteoarthritis, unspecified site: Secondary | ICD-10-CM

## 2022-01-05 DIAGNOSIS — M19071 Primary osteoarthritis, right ankle and foot: Secondary | ICD-10-CM

## 2022-01-05 NOTE — Progress Notes (Signed)
? ?Follow-Up Visit ?  ?Subjective  ?Darren Baker is a 51 y.o. male who presents for the following: Annual Exam (Hx of dysplastic nevus and AK's) and Psoriasis (With inversa and intertrigo - pt not using the Skin Medicinals mix for intertrigo because he never received the e-mail. He is using the Mometasone cream, and Kenalog spray for psoriasis PRN. ). The patient presents for Total-Body Skin Exam (TBSE) for skin cancer screening and mole check.  The patient has spots, moles and lesions to be evaluated, some may be new or changing. ? ?The following portions of the chart were reviewed this encounter and updated as appropriate:  ? Tobacco  Allergies  Meds  Problems  Med Hx  Surg Hx  Fam Hx   ?  ?Review of Systems:  No other skin or systemic complaints except as noted in HPI or Assessment and Plan. ? ?Objective  ?Well appearing patient in no apparent distress; mood and affect are within normal limits. ? ?A full examination was performed including scalp, head, eyes, ears, nose, lips, neck, chest, axillae, abdomen, back, buttocks, bilateral upper extremities, bilateral lower extremities, hands, feet, fingers, toes, fingernails, and toenails. All findings within normal limits unless otherwise noted below. ? ?Supra pubic area ?Firm SQ nodule. ? ? ?Assessment & Plan  ?Erythema intertrigo ?groin, gluteal crease ? ?With psoriasis inversa - Intertrigo is a chronic recurrent rash that occurs in skin fold areas that may be associated with friction; heat; moisture; yeast; fungus; and bacteria.  It is exacerbated by increased movement / activity; sweating; and higher atmospheric temperature. ? ?Start Skin Medicinals Intertrigo mix Iodoquinol: 1%, Hydrocortisone: 2.5%, Niacinamide: 2% cr qd prn flares ? ?Cont Mometasone cr qd up to 5d/wk aa buttocks prn flares. ? ? ?Psoriasis ?Scalp ?Psoriasis is a chronic non-curable, but treatable genetic/hereditary disease that may have other systemic features affecting other organ  systems such as joints (Psoriatic Arthritis). It is associated with an increased risk of inflammatory bowel disease, heart disease, non-alcoholic fatty liver disease, and depression.   ? ?Continue Kenalog spray to aa's QD up to 5d/wk PRN and Lidex solution to aa's QD-BID PRN. Topical steroids (such as triamcinolone, fluocinolone, fluocinonide, mometasone, clobetasol, halobetasol, betamethasone, hydrocortisone) can cause thinning and lightening of the skin if they are used for too long in the same area. Your physician has selected the right strength medicine for your problem and area affected on the body. Please use your medication only as directed by your physician to prevent side effects.  ? ?Related Medications ?fluocinonide (LIDEX) 0.05 % external solution ?Apply 1 application topically 2 (two) times daily as needed. ? ?triamcinolone (KENALOG) 0.147 MG/GM topical spray ?Apply topically as directed. Qd up to 5 days a week to aa psoriasis in scalp, avoid face, groin, axilla ? ?Irritant contact dermatitis, likely from clothing rubbing ?Abdomen ?Ok to use Mometasone cream to aa's QD-BID up to 5d/wk PRN. Topical steroids (such as triamcinolone, fluocinolone, fluocinonide, mometasone, clobetasol, halobetasol, betamethasone, hydrocortisone) can cause thinning and lightening of the skin if they are used for too long in the same area. Your physician has selected the right strength medicine for your problem and area affected on the body. Please use your medication only as directed by your physician to prevent side effects.  ? ?Epidermal inclusion cyst ?Supra pubic area ?With hx of abscess formation, but calm now - Benign-appearing. Exam most consistent with an epidermal inclusion cyst. Discussed that a cyst is a benign growth that can grow over time and sometimes get  irritated or inflamed. Recommend observation if it is not bothersome. Discussed option of surgical excision to remove it if it is growing, symptomatic, or  other changes noted. Please call for new or changing lesions so they can be evaluated. ? ?Inflammation of joint ?R plantar surface ?Not skin related. Recommend patient following up with his primary care physician or podiatrist or orthopedic surgeon for treatment. ? ?Lentigines ?- Scattered tan macules ?- Due to sun exposure ?- Benign-appearing, observe ?- Recommend daily broad spectrum sunscreen SPF 30+ to sun-exposed areas, reapply every 2 hours as needed. ?- Call for any changes ? ?Seborrheic Keratoses ?- Stuck-on, waxy, tan-brown papules and/or plaques  ?- Benign-appearing ?- Discussed benign etiology and prognosis. ?- Observe ?- Call for any changes ? ?Melanocytic Nevi ?- Tan-brown and/or pink-flesh-colored symmetric macules and papules ?- Benign appearing on exam today ?- Observation ?- Call clinic for new or changing moles ?- Recommend daily use of broad spectrum spf 30+ sunscreen to sun-exposed areas.  ? ?Hemangiomas ?- Red papules ?- Discussed benign nature ?- Observe ?- Call for any changes ? ?Actinic Damage ?- Chronic condition, secondary to cumulative UV/sun exposure ?- diffuse scaly erythematous macules with underlying dyspigmentation ?- Recommend daily broad spectrum sunscreen SPF 30+ to sun-exposed areas, reapply every 2 hours as needed.  ?- Staying in the shade or wearing long sleeves, sun glasses (UVA+UVB protection) and wide brim hats (4-inch brim around the entire circumference of the hat) are also recommended for sun protection.  ?- Call for new or changing lesions. ? ?History of Dysplastic Nevi - reviewed in detail pathology results of the L xyphoid site.  ?- No evidence of recurrence today ?- Recommend regular full body skin exams ?- Recommend daily broad spectrum sunscreen SPF 30+ to sun-exposed areas, reapply every 2 hours as needed.  ?- Call if any new or changing lesions are noted between office visits ? ?Acrochordons (Skin Tags) ?- Fleshy, skin-colored pedunculated papules ?- Benign  appearing.  ?- Observe. ?- If desired, they can be removed with an in office procedure that is not covered by insurance. ?- Please call the clinic if you notice any new or changing lesions. ? ?Skin cancer screening performed today. ? ?Return in about 1 year (around 01/06/2023) for TBSE. ? ?I, Rudell Cobb, CMA, am acting as scribe for Sarina Ser, MD . ?Documentation: I have reviewed the above documentation for accuracy and completeness, and I agree with the above. ? ?Sarina Ser, MD ? ? ?

## 2022-01-05 NOTE — Patient Instructions (Addendum)
Ok to use Mometasone 0.1% cream once to twice daily up to five days per week to irritation on abdomen. Topical steroids (such as triamcinolone, fluocinolone, fluocinonide, mometasone, clobetasol, halobetasol, betamethasone, hydrocortisone) can cause thinning and lightening of the skin if they are used for too long in the same area. Your physician has selected the right strength medicine for your problem and area affected on the body. Please use your medication only as directed by your physician to prevent side effects.  ? ? ?Instructions for [facility] Medications ? ?One or more of your medications was sent to the [facility] mail order compounding pharmacy. You will receive an email from them and can purchase the medicine through that link. It will then be mailed to your home at the address you confirmed. If for any reason you do not receive an email from them, please check your spam folder. If you still do not find the email, please let us know. [facility] phone number is [phone]. ? ? ?If You Need Anything After Your Visit ? ?If you have any questions or concerns for your doctor, please call our main line at [phone] and press option 4 to reach your doctor's medical assistant. If no one answers, please leave a voicemail as directed and we will return your call as soon as possible. Messages left after 4 pm will be answered the following business day.  ? ?You may also send [place] a message via MyChart. We typically respond to MyChart messages within 1-2 business days. ? ?For prescription refills, please ask your pharmacy to contact our office. Our fax number is [phone]. ? ?If you have an urgent issue when the clinic is closed that cannot wait until the next business day, you can page your doctor at the number below.   ? ?Please note that while we do our best to be available for urgent issues outside of office hours, we are not available 24/7.  ? ?If you have an urgent issue and are unable to  reach [place], you may choose to seek medical care at your doctor's office, retail clinic, urgent care center, or emergency room. ? ?If you have a medical emergency, please immediately call 911 or go to the emergency department. ? ?Pager Numbers ? ?- Dr. Nehemiah Massed: [phone] ? ?- Dr. Laurence Ferrari: [phone] ? ?- Dr. Nicole Kindred: [phone] ? ?In the event of inclement weather, please call our main line at [phone] for an update on the status of any delays or closures. ? ?Dermatology Medication Tips: ?Please keep the boxes that topical medications come in in order to help keep track of the instructions about where and how to use these. Pharmacies typically print the medication instructions only on the boxes and not directly on the medication tubes.  ? ?If your medication is too expensive, please contact our office at [phone] option 4 or send [place] a message through [place].  ? ?We are unable to tell what your co-pay for medications will be in advance as this is different depending on your insurance coverage. However, we may be able to find a substitute medication at lower cost or fill out paperwork to get insurance to cover a needed medication.  ? ?If a prior authorization is required to get your medication covered by your insurance company, please allow [place] 1-2 business days to complete this process. ? ?Drug prices often vary depending on where the prescription is filled and some pharmacies may offer cheaper prices. ? ?The website www.goodrx.com contains coupons for medications through  different pharmacies. The prices here do not account for what the cost may be with help from insurance (it may be cheaper with your insurance), but the website can give you the price if you did not use any insurance.  ?- You can print the associated coupon and take it with your prescription to the pharmacy.  ?- You may also stop by our office during regular business hours and pick up a GoodRx coupon card.  ?- If you need your prescription  sent electronically to a different pharmacy, notify our office through [facility] or by phone at [phone] option 4. ? ? ? ? ?Si Usted Necesita Algo Despu?s de Su Visita ? ?Tambi?n puede enviarnos un mensaje a trav?s de MyChart. Por lo general respondemos a los mensajes de MyChart en el transcurso de 1 a 2 d?as h?biles. ? ?Para renovar recetas, por favor pida a su farmacia que se ponga en contacto con nuestra oficina. Nuestro n?mero de fax es el [phone]. ? ?Si tiene un asunto urgente cuando la cl?nica est? cerrada y que no puede esperar hasta el siguiente d?a h?bil, puede llamar/localizar a su doctor(a) al n?mero que aparece a continuaci?n.  ? ?Por favor, tenga en cuenta que aunque hacemos todo lo posible para estar disponibles para asuntos urgentes fuera del horario de oficina, no estamos disponibles las 24 horas del d?a, los 7 d?as de la semana.  ? ?Si tiene un problema urgente y no puede comunicarse con nosotros, puede optar por buscar atenci?n m?dica  en el consultorio de su doctor(a), en una cl?nica privada, en un centro de atenci?n urgente o en una sala de emergencias. ? ?Si tiene Engineer, maintenance (IT) m?dica, por favor llame inmediatamente al 911 o vaya a la sala de emergencias. ? ?N?meros de b?per ? ?- Dr. Nehemiah Massed: [phone] ? ?- Dra. Moye: [phone] ? ?- Dra. Nicole Kindred: [phone] ? ?En caso de inclemencias del tiempo, por favor llame a nuestra l?nea principal al [phone] para una actualizaci?n sobre el estado de cualquier retraso o cierre. ? ?Consejos para la medicaci?n en dermatolog?a: ?Por favor, guarde las cajas en las que vienen los medicamentos de uso t?pico para ayudarle a seguir las instrucciones sobre d?nde y c?mo usarlos. Las farmacias generalmente imprimen las instrucciones del medicamento s?lo en las cajas y no directamente en los tubos del [place].  ? ?Si su medicamento es muy caro, por favor, p?ngase en contacto con Zigmund Daniel llamando al [phone] y presione  la opci?n 4 o env?enos un mensaje a trav?s de MyChart.  ? ?No podemos decirle cu?l ser? su copago por los medicamentos por adelantado ya que esto es diferente dependiendo de la cobertura de su seguro. Sin embargo, es posible que podamos encontrar un medicamento sustituto a Electrical engineer un formulario para que el seguro cubra el medicamento que se considera necesario.  ? ?Si se requiere Ardelia Mems autorizaci?n previa para que su compa??a de seguros [place] su medicamento, por favor perm?tanos de 1 a 2 d?as h?biles para completar este proceso. ? ?Los precios de los medicamentos var?an con frecuencia dependiendo del Environmental consultant de d?nde se surte la receta y alguna farmacias pueden ofrecer precios m?s baratos. ? ?El sitio web www.goodrx.com tiene cupones para medicamentos de Airline pilot. Los precios aqu? no tienen en cuenta lo que podr?a costar con la ayuda del seguro (puede ser m?s barato con su seguro), pero el sitio web puede darle el precio si no utiliz? ning?n seguro.  ?- Puede imprimir el cup?n correspondiente y llevarlo con  su receta a la farmacia.  ?- Tambi?n puede pasar por nuestra oficina durante el horario de atenci?n regular y recoger una tarjeta de cupones de GoodRx.  ?- Si necesita que su receta se env?e electr?nicamente a Chiropodist, informe a nuestra oficina a trav?s de MyChart de [place] o por tel?fono llamando al [phone] y presione la opci?n 4. ? ?

## 2022-01-06 ENCOUNTER — Encounter: Payer: Self-pay | Admitting: Dermatology

## 2022-01-16 ENCOUNTER — Telehealth: Payer: Self-pay

## 2022-01-16 ENCOUNTER — Ambulatory Visit: Payer: BC Managed Care – PPO

## 2022-01-16 ENCOUNTER — Ambulatory Visit (INDEPENDENT_AMBULATORY_CARE_PROVIDER_SITE_OTHER): Payer: BC Managed Care – PPO | Admitting: Family Medicine

## 2022-01-16 VITALS — BP 132/96 | HR 75 | Ht 71.0 in

## 2022-01-16 DIAGNOSIS — M7742 Metatarsalgia, left foot: Secondary | ICD-10-CM

## 2022-01-16 DIAGNOSIS — R42 Dizziness and giddiness: Secondary | ICD-10-CM | POA: Diagnosis not present

## 2022-01-16 MED ORDER — MECLIZINE HCL 25 MG PO TABS: 25.0000 mg | ORAL_TABLET | Freq: Three times a day (TID) | ORAL | 0 refills | Status: DC | PRN

## 2022-01-16 NOTE — Progress Notes (Signed)
? ?Subjective:  ? ? Patient ID: Darren Baker, male    DOB: 1971-05-13, 51 y.o.   MRN: [id number] ? ?11/07/21 ?Patient is a very pleasant 51 year old Caucasian gentleman who presents today with pain in his right foot.  There is a collection of fluid just adjacent to the right fifth MTP joint.  There are some tenderness to palpation over the MTP joint and there is subcutaneous swelling that is palpable.  This is mild to moderate.  There is no erythema.  The patient denies any injury although there is significant wear marks inside his shoe where his shoe is constantly rubbing up against the side of his fifth MTP joint.  I believe that this is either an effusion in the fifth MTP joint or possibly a bursitis.  There is no erythema or warmth or fever.  Pain is gradually improving.  There is no skin breakdown or erythema or blistering ? ?Patient also had COVID 1 week ago.  He has been fully vaccinated.  He has no residual fever or hemoptysis or pleurisy or shortness of breath.  Does occasionally have some upper airway congestion.  At that time, my plan was: ? ?There is no evidence of any fracture or dislocation or infection or erythema or warmth.  I believe that this could either represent an effusion around the fifth MTP joint/capsulitis or possibly a bursitis due to pressure and friction.  I recommended trying a prednisone taper pack and getting a wider toe box in his shoe to avoid friction and pressure.  Anticipate gradual improvement over the next week.  Proceed with x-rays if not improving.  I believe the patient is cleared to return to work. ? ?01/16/22 ?Patient walked in today concerned complaining of dizziness.  He reports that the dizziness began suddenly this morning.  When he sat up in bed he felt very lightheaded and reported that the room felt like it was spinning.  He felt similarly at work this afternoon.  Today he reports with turning of his head, he feels lightheaded.  He denies any syncope or presyncope  or near syncope.  His blood pressure has been elevated however he is extremely anxious regarding the dizziness.  He denies any slurred speech or facial droop or weakness in his arms or legs.  There is no evidence of a stroke or any neurologic deficit.  On examination today, the patient has a positive Dix-Hallpike maneuver to the left with nystagmus.  He also complains of pain in his left foot.  He is tender to palpation over the plantar aspect of the fifth MTP joint.  It is exquisitely tender to the touch over the metatarsal head.  There is no erythema.  There is no visible swelling.  Prednisone was ineffective.  He is requesting referral to podiatry. ?Past Medical History:  ?Diagnosis Date  ? DDD (degenerative disc disease), lumbar   ? Dysplastic nevus 02/24/2016  ? L chest - mild   ? Dysplastic nevus 08/11/2021  ? Left xyphoid - moderate  ? Hypertension   ? Low back pain   ? Migraine   ? Obesity   ? Post concussion syndrome   ? ? ?Current Outpatient Medications on File Prior to Visit  ?Medication Sig Dispense Refill  ? acetaminophen (TYLENOL) 500 MG tablet Take 1,000 mg by mouth every 6 (six) hours as needed for moderate pain.    ? ALPRAZolam (XANAX) 0.5 MG tablet Take 1 tablet (0.5 mg total) by mouth 3 (three) times daily as needed  for anxiety. 90 tablet 0  ? eletriptan (RELPAX) 40 MG tablet Take 40 mg by mouth daily as needed for migraine. One tablet by mouth at onset of headache. May repeat in 2 hours if headache persists or recurs.    ? fluocinonide (LIDEX) 0.05 % external solution Apply 1 application topically 2 (two) times daily as needed. 180 mL 3  ? HYDROcodone-acetaminophen (NORCO/VICODIN) 5-325 MG tablet Take 1 tablet by mouth every 6 (six) hours as needed for moderate pain. 90 tablet 0  ? hydroxypropyl methylcellulose / hypromellose (ISOPTO TEARS / GONIOVISC) 2.5 % ophthalmic solution Place 1 drop into both eyes daily as needed for dry eyes.    ? LINZESS 145 MCG CAPS capsule TAKE 1 CAPSULE BY MOUTH  DAILY BEFORE BREAKFAST. 30 capsule 11  ? losartan-hydrochlorothiazide (HYZAAR) 50-12.5 MG tablet TAKE 1 TABLET BY MOUTH EVERY DAY 90 tablet 3  ? magnesium oxide (MAG-OX) 400 (241.3 MG) MG tablet Take 400-800 mg by mouth daily.    ? meloxicam (MOBIC) 7.5 MG tablet Take 1 tablet (7.5 mg total) by mouth daily. (Patient taking differently: Take 7.5-15 mg by mouth daily.) 30 tablet 5  ? methocarbamol (ROBAXIN) 500 MG tablet Take 500 mg by mouth every 8 (eight) hours as needed for muscle spasms.    ? mometasone (ELOCON) 0.1 % cream Apply 1 application topically as directed. Qd up to 5 days a week aa rash in buttocks crease 50 g 1  ? predniSONE (DELTASONE) 20 MG tablet 3 tabs poqday 1-2, 2 tabs poqday 3-4, 1 tab poqday 5-6 12 tablet 0  ? sildenafil (VIAGRA) 100 MG tablet Take 0.5-1 tablets (50-100 mg total) by mouth daily as needed for erectile dysfunction. 5 tablet 11  ? sulfacetamide (BLEPH-10) 10 % ophthalmic solution     ? tiZANidine (ZANAFLEX) 4 MG tablet Take 4-8 mg by mouth at bedtime as needed.    ? triamcinolone (KENALOG) 0.147 MG/GM topical spray Apply topically as directed. Qd up to 5 days a week to aa psoriasis in scalp, avoid face, groin, axilla 63 g 3  ? UBRELVY 100 MG TABS Take 100 mg by mouth daily as needed (migraine).    ? valACYclovir (VALTREX) 1000 MG tablet Take 1 tablet (1,000 mg total) by mouth as directed. Take 2 po with first symptoms of fever blister then 2 po 12 hours later 30 tablet 11  ? verapamil (CALAN-SR) 240 MG CR tablet Take 240 mg by mouth daily.  3  ? ?No current facility-administered medications on file prior to visit.  ? ?No Known Allergies ?Social History  ? ?Socioeconomic History  ? Marital status: Single  ?  Spouse name: Not on file  ? Number of children: Not on file  ? Years of education: Not on file  ? Highest education level: Not on file  ?Occupational History  ? Not on file  ?Tobacco Use  ? Smoking status: Never  ? Smokeless tobacco: Never  ?Substance and Sexual Activity  ?  Alcohol use: Yes  ?  Comment: Rare  ? Drug use: No  ? Sexual activity: Not on file  ?  Comment: Married, works for Building surveyor.  ?Other Topics Concern  ? Not on file  ?Social History Narrative  ? Not on file  ? ?Social Determinants of Health  ? ?Financial Resource Strain: Not on file  ?Food Insecurity: Not on file  ?Transportation Needs: Not on file  ?Physical Activity: Not on file  ?Stress: Not on file  ?Social Connections: Not on file  ?  Intimate Partner Violence: Not on file  ? ? ? ? ?Review of Systems  ?All other systems reviewed and are negative. ? ?   ?Objective:  ? Physical Exam ?Vitals reviewed.  ?Constitutional:   ?   General: He is not in acute distress. ?   Appearance: He is obese. He is not ill-appearing, toxic-appearing or diaphoretic.  ?HENT:  ?   Head: Normocephalic and atraumatic.  ?Cardiovascular:  ?   Rate and Rhythm: Normal rate and regular rhythm.  ?   Pulses:     ?     Dorsalis pedis pulses are 2+ on the right side.  ?   Heart sounds: Normal heart sounds. No murmur heard. ?  No friction rub. No gallop.  ?Pulmonary:  ?   Effort: Pulmonary effort is normal. No respiratory distress.  ?   Breath sounds: Normal breath sounds. No stridor. No wheezing or rhonchi.  ?Abdominal:  ?   General: There is no distension.  ?   Palpations: Abdomen is soft.  ?   Tenderness: There is no abdominal tenderness. There is no guarding.  ?Musculoskeletal:  ?   Right foot: Normal range of motion. No prominent metatarsal heads.  ?     Feet: ? ?Feet:  ?   Right foot:  ?   Skin integrity: No ulcer, blister, skin breakdown, erythema, warmth, callus or dry skin.  ?Neurological:  ?   Mental Status: He is alert.  ? ? ? ? ? ?   ?Assessment & Plan:  ?Metatarsalgia of left foot ? ?Vertigo ?Believe the patient is experiencing benign paroxysmal positional vertig.  Recommended trying meclizine 25 mg every 8 hours as needed for dizziness.  His blood pressure is elevated I believe due to anxiety.  I tried to reassure the patient that  his neurologic exam.  Normal.  He has significant metatarsalgia over the plantar aspect of the right fifth MTP joint.  I am going to consult podiatry given the severity of his pain despite taking prednisone a

## 2022-01-16 NOTE — Telephone Encounter (Signed)
Called spoke with pt and agree to come in on 01/20/22 to discuss foot pain with Dr. Dennard Schaumann ?

## 2022-01-16 NOTE — Telephone Encounter (Signed)
Pt called in stating that he is still having issues with his foot and would like a call back from nurse/clinical staff before making an appt. Please advise. ? ?Cb#: [phone] ?

## 2022-01-20 ENCOUNTER — Ambulatory Visit (INDEPENDENT_AMBULATORY_CARE_PROVIDER_SITE_OTHER): Payer: BC Managed Care – PPO

## 2022-01-20 ENCOUNTER — Encounter: Payer: Self-pay | Admitting: Podiatry

## 2022-01-20 ENCOUNTER — Ambulatory Visit: Payer: BC Managed Care – PPO | Admitting: Family Medicine

## 2022-01-20 ENCOUNTER — Ambulatory Visit: Payer: BC Managed Care – PPO | Admitting: Podiatry

## 2022-01-20 DIAGNOSIS — M79671 Pain in right foot: Secondary | ICD-10-CM

## 2022-01-20 DIAGNOSIS — Q828 Other specified congenital malformations of skin: Secondary | ICD-10-CM | POA: Diagnosis not present

## 2022-01-20 NOTE — Progress Notes (Signed)
?  Subjective:  ?Patient ID: Darren Baker, male    DOB: 01-Jun-1971,   MRN: [id number] ? ?Chief Complaint  ?Patient presents with  ? Cyst  ?  Possible cyst on the right foot  ? ? ?51 y.o. male presents for concern for knot on the bottom of his right foot. Relates it has been present for several months. Seen by PCP and given medrol dose pack and told to buy wide shoes. Relates he feels like there is a hard area.  . Denies any other pedal complaints. Denies n/v/f/c.  ? ?Past Medical History:  ?Diagnosis Date  ? DDD (degenerative disc disease), lumbar   ? Dysplastic nevus 02/24/2016  ? L chest - mild   ? Dysplastic nevus 08/11/2021  ? Left xyphoid - moderate  ? Hypertension   ? Low back pain   ? Migraine   ? Obesity   ? Post concussion syndrome   ? ? ?Objective:  ?Physical Exam: ?Vascular: DP/PT pulses 2/4 bilateral. CFT <3 seconds. Normal hair growth on digits. No edema.  ?Skin. No lacerations or abrasions bilateral feet. Cored hyperkeratotic lesion noted to plantar fifth metatarsal on right.  ?Musculoskeletal: MMT 5/5 bilateral lower extremities in DF, PF, Inversion and Eversion. Deceased ROM in DF of ankle joint.  ?Neurological: Sensation intact to light touch.  ? ?Assessment:  ? ?1. Porokeratosis   ? ? ? ?Plan:  ?Patient was evaluated and treated and all questions answered. ?-Discussed corns and calluses with patient and treatment options.  ?-Hyperkeratotic tissue was debrided with chisel without incident.  ?-Applied salycylic acid treatment to area with dressing. Advised to remove bandaging tomorrow.  ?-Encouraged daily moisturizing ?-Discussed use of pumice stone ?-Advised good supportive shoes and inserts ?-Patient to return to office as needed or sooner if condition worsens. ? ? ?Lorenda Peck, DPM  ? ? ?

## 2022-01-25 ENCOUNTER — Ambulatory Visit: Payer: BC Managed Care – PPO | Admitting: Podiatry

## 2022-01-27 ENCOUNTER — Ambulatory Visit: Payer: BC Managed Care – PPO | Admitting: Podiatry

## 2022-04-25 ENCOUNTER — Ambulatory Visit: Payer: BC Managed Care – PPO | Admitting: Family Medicine

## 2022-04-25 VITALS — BP 128/78 | HR 83 | Temp 98.1°F | Ht 71.0 in | Wt 276.4 lb

## 2022-04-25 DIAGNOSIS — H60332 Swimmer's ear, left ear: Secondary | ICD-10-CM

## 2022-04-25 MED ORDER — NEOMYCIN-POLYMYXIN-HC 3.5-10000-1 OT SOLN: 3.0000 [drp] | Freq: Four times a day (QID) | OTIC | 0 refills | Status: DC

## 2022-04-25 MED ORDER — ALPRAZOLAM 0.5 MG PO TABS: 0.5000 mg | ORAL_TABLET | Freq: Three times a day (TID) | ORAL | 0 refills | Status: DC | PRN

## 2022-04-25 NOTE — Progress Notes (Signed)
Subjective:    Patient ID: Darren Baker, male    DOB: Nov 24, 1970, 51 y.o.   MRN: [id number]  Patient reports pain in his left ear.  He reports tenderness in the left ear canal.  He also reports decreased hearing.  He states that he feels like [place] is [place] and that he needs to pop his ear to relieve pressure.  He denies any fever or chills.  On examination today, the left tympanic membrane is normal and healthy in appearance however his left auditory canal is erythematous swollen and there is inflammation and exudate lining the floor the canal.  He is tender to palpation in that area as I was able to apply pressure with the end of the speculum and elicit pain.  He is also requesting a refill on his Xanax.  90 tablets of lasted him more than 6 months Past Medical History:  Diagnosis Date   DDD (degenerative disc disease), lumbar    Dysplastic nevus 02/24/2016   L chest - mild    Dysplastic nevus 08/11/2021   Left xyphoid - moderate   Hypertension    Low back pain    Migraine    Obesity    Post concussion syndrome     Current Outpatient Medications on File Prior to Visit  Medication Sig Dispense Refill   acetaminophen (TYLENOL) 500 MG tablet Take 1,000 mg by mouth every 6 (six) hours as needed for moderate pain.     eletriptan (RELPAX) 40 MG tablet Take 40 mg by mouth daily as needed for migraine. One tablet by mouth at onset of headache. May repeat in 2 hours if headache persists or recurs.     fluocinonide (LIDEX) 0.05 % external solution Apply 1 application topically 2 (two) times daily as needed. 180 mL 3   HYDROcodone-acetaminophen (NORCO/VICODIN) 5-325 MG tablet Take 1 tablet by mouth every 6 (six) hours as needed for moderate pain. 90 tablet 0   hydroxypropyl methylcellulose / hypromellose (ISOPTO TEARS / GONIOVISC) 2.5 % ophthalmic solution Place 1 drop into both eyes daily as needed for dry eyes.     LINZESS 145 MCG CAPS capsule TAKE 1 CAPSULE BY MOUTH DAILY BEFORE  BREAKFAST. 30 capsule 11   losartan-hydrochlorothiazide (HYZAAR) 50-12.5 MG tablet TAKE 1 TABLET BY MOUTH EVERY DAY 90 tablet 3   magnesium oxide (MAG-OX) 400 (241.3 MG) MG tablet Take 400-800 mg by mouth daily.     meclizine (ANTIVERT) 25 MG tablet Take 1 tablet (25 mg total) by mouth 3 (three) times daily as needed for dizziness. 30 tablet 0   meloxicam (MOBIC) 7.5 MG tablet Take 1 tablet (7.5 mg total) by mouth daily. (Patient taking differently: Take 7.5-15 mg by mouth daily.) 30 tablet 5   methocarbamol (ROBAXIN) 500 MG tablet Take 500 mg by mouth every 8 (eight) hours as needed for muscle spasms.     mometasone (ELOCON) 0.1 % cream Apply 1 application topically as directed. Qd up to 5 days a week aa rash in buttocks crease 50 g 1   predniSONE (DELTASONE) 20 MG tablet 3 tabs poqday 1-2, 2 tabs poqday 3-4, 1 tab poqday 5-6 12 tablet 0   sildenafil (VIAGRA) 100 MG tablet Take 0.5-1 tablets (50-100 mg total) by mouth daily as needed for erectile dysfunction. 5 tablet 11   sulfacetamide (BLEPH-10) 10 % ophthalmic solution      tiZANidine (ZANAFLEX) 4 MG tablet Take 4-8 mg by mouth at bedtime as needed.     triamcinolone (  KENALOG) 0.147 MG/GM topical spray Apply topically as directed. Qd up to 5 days a week to aa psoriasis in scalp, avoid face, groin, axilla 63 g 3   UBRELVY 100 MG TABS Take 100 mg by mouth daily as needed (migraine).     valACYclovir (VALTREX) 1000 MG tablet Take 1 tablet (1,000 mg total) by mouth as directed. Take 2 po with first symptoms of fever blister then 2 po 12 hours later 30 tablet 11   verapamil (CALAN-SR) 240 MG CR tablet Take 240 mg by mouth daily.  3   No current facility-administered medications on file prior to visit.   No Known Allergies Social History   Socioeconomic History   Marital status: Single    Spouse name: Not on file   Number of children: Not on file   Years of education: Not on file   Highest education level: Not on file  Occupational History    Not on file  Tobacco Use   Smoking status: Never   Smokeless tobacco: Never  Substance and Sexual Activity   Alcohol use: Yes    Comment: Rare   Drug use: No   Sexual activity: Not on file    Comment: Married, works for Building surveyor.  Other Topics Concern   Not on file  Social History Narrative   Not on file   Social Determinants of Health   Financial Resource Strain: Not on file  Food Insecurity: Not on file  Transportation Needs: Not on file  Physical Activity: Not on file  Stress: Not on file  Social Connections: Not on file  Intimate Partner Violence: Not on file      Review of Systems  Neurological:  Positive for dizziness.  All other systems reviewed and are negative.      Objective:   Physical Exam Vitals reviewed.  Constitutional:      General: He is not in acute distress.    Appearance: He is obese. He is not ill-appearing, toxic-appearing or diaphoretic.  HENT:     Head: Normocephalic and atraumatic.     Right Ear: Tympanic membrane and ear canal normal.     Left Ear: Tympanic membrane normal. Drainage present.  No middle ear effusion. Tympanic membrane is not scarred, erythematous, retracted or bulging.  Cardiovascular:     Rate and Rhythm: Normal rate and regular rhythm.     Heart sounds: Normal heart sounds. No murmur heard.    No friction rub. No gallop.  Pulmonary:     Effort: Pulmonary effort is normal. No respiratory distress.     Breath sounds: Normal breath sounds. No stridor. No wheezing or rhonchi.  Abdominal:     General: There is no distension.     Palpations: Abdomen is soft.     Tenderness: There is no abdominal tenderness. There is no guarding.  Neurological:     Mental Status: He is alert.           Assessment & Plan:  Acute swimmer's ear of left side Patient has swimmer's ear in his left ear.  Use Cortisporin HC otic 3 drops 4 times daily for the next 7 days.  He may also have an element of eustachian tube dysfunction  so I did recommend over-the-counter Nasacort or Flonase.  Recheck if no better in 1 week.  I did refill his Xanax.

## 2022-04-27 ENCOUNTER — Other Ambulatory Visit: Payer: BC Managed Care – PPO

## 2022-04-27 ENCOUNTER — Other Ambulatory Visit: Payer: Self-pay

## 2022-04-27 DIAGNOSIS — I10 Essential (primary) hypertension: Secondary | ICD-10-CM

## 2022-04-27 DIAGNOSIS — E669 Obesity, unspecified: Secondary | ICD-10-CM

## 2022-04-28 LAB — COMPREHENSIVE METABOLIC PANEL
AG Ratio: 2.1 (calc) (ref 1.0–2.5)
ALT: 16 U/L (ref 9–46)
AST: 17 U/L (ref 10–35)
Albumin: 4.7 g/dL (ref 3.6–5.1)
Alkaline phosphatase (APISO): 94 U/L (ref 35–144)
BUN: 21 mg/dL (ref 7–25)
CO2: 26 mmol/L (ref 20–32)
Calcium: 10.1 mg/dL (ref 8.6–10.3)
Chloride: 105 mmol/L (ref 98–110)
Creat: 1.26 mg/dL (ref 0.70–1.30)
Globulin: 2.2 g/dL (calc) (ref 1.9–3.7)
Glucose, Bld: 90 mg/dL (ref 65–99)
Potassium: 4.6 mmol/L (ref 3.5–5.3)
Sodium: 140 mmol/L (ref 135–146)
Total Bilirubin: 0.5 mg/dL (ref 0.2–1.2)
Total Protein: 6.9 g/dL (ref 6.1–8.1)

## 2022-04-28 LAB — LIPID PANEL
Cholesterol: 148 mg/dL (ref <200)
HDL: 56 mg/dL (ref >=40)
LDL Cholesterol (Calc): 79 mg/dL (calc)
Non-HDL Cholesterol (Calc): 92 mg/dL (calc) (ref <130)
Total CHOL/HDL Ratio: 2.6 (calc) (ref <5.0)
Triglycerides: 57 mg/dL (ref <150)

## 2022-04-28 LAB — CBC WITH DIFFERENTIAL/PLATELET
Absolute Monocytes: 681 cells/uL (ref 200–950)
Basophils Absolute: 74 cells/uL (ref 0–200)
Basophils Relative: 0.9 %
Eosinophils Absolute: 164 cells/uL (ref 15–500)
Eosinophils Relative: 2 %
HCT: 42.8 % (ref 38.5–50.0)
Hemoglobin: 14.7 g/dL (ref 13.2–17.1)
Lymphs Abs: 1673 cells/uL (ref 850–3900)
MCH: 29.8 pg (ref 27.0–33.0)
MCHC: 34.3 g/dL (ref 32.0–36.0)
MCV: 86.6 fL (ref 80.0–100.0)
MPV: 10.6 fL (ref 7.5–12.5)
Monocytes Relative: 8.3 %
Neutro Abs: 5609 cells/uL (ref 1500–7800)
Neutrophils Relative %: 68.4 %
Platelets: 309 10*3/uL (ref 140–400)
RBC: 4.94 10*6/uL (ref 4.20–5.80)
RDW: 12.5 % (ref 11.0–15.0)
Total Lymphocyte: 20.4 %
WBC: 8.2 10*3/uL (ref 3.8–10.8)

## 2022-05-01 ENCOUNTER — Ambulatory Visit: Payer: BC Managed Care – PPO | Admitting: Family Medicine

## 2022-05-25 ENCOUNTER — Ambulatory Visit (INDEPENDENT_AMBULATORY_CARE_PROVIDER_SITE_OTHER): Payer: BC Managed Care – PPO | Admitting: Family Medicine

## 2022-05-25 VITALS — BP 120/76 | HR 76 | Temp 98.0°F | Ht 71.0 in | Wt 280.0 lb

## 2022-05-25 DIAGNOSIS — Z0000 Encounter for general adult medical examination without abnormal findings: Secondary | ICD-10-CM | POA: Diagnosis not present

## 2022-05-25 DIAGNOSIS — Z1211 Encounter for screening for malignant neoplasm of colon: Secondary | ICD-10-CM | POA: Diagnosis not present

## 2022-05-25 DIAGNOSIS — I10 Essential (primary) hypertension: Secondary | ICD-10-CM

## 2022-05-25 DIAGNOSIS — G8929 Other chronic pain: Secondary | ICD-10-CM

## 2022-05-25 DIAGNOSIS — M5136 Other intervertebral disc degeneration, lumbar region: Secondary | ICD-10-CM | POA: Diagnosis not present

## 2022-05-25 DIAGNOSIS — M5441 Lumbago with sciatica, right side: Secondary | ICD-10-CM

## 2022-05-25 NOTE — Progress Notes (Signed)
Subjective:    Patient ID: Darren Baker, male    DOB: January 14, 1971, 51 y.o.   MRN: [id number]  HPI Patient is here today for a complete physical exam.  Blood pressure is excellent at 120/76.  He is still dealing with a lot of chronic low back pain stemming from degenerative disc disease.  The pain radiates into his right hip constantly.  He states that he can barely stand for 15 minutes without aching throbbing pain in his hip.  He is seeing a pain clinic through [facility]  He is taking meloxicam on a daily basis just to function.  He is due for colonoscopy.  He prefers Cologuard.  He is due for a flu shot.  He is due for the shingles vaccine.  Otherwise he is doing well with no concerns.  Most recent lab work is listed below Orders Only on 04/27/2022  Component Date Value Ref Range Status   Glucose, Bld 04/27/2022 90  65 - 99 mg/dL Final   Comment: .            Fasting reference interval .    BUN 04/27/2022 21  7 - 25 mg/dL Final   Creat 04/27/2022 1.26  0.70 - 1.30 mg/dL Final   BUN/Creatinine Ratio 38/25/0539 NOT APPLICABLE  6 - 22 (calc) Final   Sodium 04/27/2022 140  135 - 146 mmol/L Final   Potassium 04/27/2022 4.6  3.5 - 5.3 mmol/L Final   Chloride 04/27/2022 105  98 - 110 mmol/L Final   CO2 04/27/2022 26  20 - 32 mmol/L Final   Calcium 04/27/2022 10.1  8.6 - 10.3 mg/dL Final   Total Protein 04/27/2022 6.9  6.1 - 8.1 g/dL Final   Albumin 04/27/2022 4.7  3.6 - 5.1 g/dL Final   Globulin 04/27/2022 2.2  1.9 - 3.7 g/dL (calc) Final   AG Ratio 04/27/2022 2.1  1.0 - 2.5 (calc) Final   Total Bilirubin 04/27/2022 0.5  0.2 - 1.2 mg/dL Final   Alkaline phosphatase (APISO) 04/27/2022 94  35 - 144 U/L Final   AST 04/27/2022 17  10 - 35 U/L Final   ALT 04/27/2022 16  9 - 46 U/L Final   WBC 04/27/2022 8.2  3.8 - 10.8 Thousand/uL Final   RBC 04/27/2022 4.94  4.20 - 5.80 Million/uL Final   Hemoglobin 04/27/2022 14.7  13.2 - 17.1 g/dL Final   HCT 04/27/2022 42.8  38.5 - 50.0 % Final    MCV 04/27/2022 86.6  80.0 - 100.0 fL Final   MCH 04/27/2022 29.8  27.0 - 33.0 pg Final   MCHC 04/27/2022 34.3  32.0 - 36.0 g/dL Final   RDW 04/27/2022 12.5  11.0 - 15.0 % Final   Platelets 04/27/2022 309  140 - 400 Thousand/uL Final   MPV 04/27/2022 10.6  7.5 - 12.5 fL Final   Neutro Abs 04/27/2022 5,609  1,500 - 7,800 cells/uL Final   Lymphs Abs 04/27/2022 1,673  850 - 3,900 cells/uL Final   Absolute Monocytes 04/27/2022 681  200 - 950 cells/uL Final   Eosinophils Absolute 04/27/2022 164  15 - 500 cells/uL Final   Basophils Absolute 04/27/2022 74  0 - 200 cells/uL Final   Neutrophils Relative % 04/27/2022 68.4  % Final   Total Lymphocyte 04/27/2022 20.4  % Final   Monocytes Relative 04/27/2022 8.3  % Final   Eosinophils Relative 04/27/2022 2.0  % Final   Basophils Relative 04/27/2022 0.9  % Final   Cholesterol 04/27/2022 148  <  200 mg/dL Final   HDL 04/27/2022 56  > OR = 40 mg/dL Final   Triglycerides 04/27/2022 57  <150 mg/dL Final   LDL Cholesterol (Calc) 04/27/2022 79  mg/dL (calc) Final   Comment: Reference range: <100 . Desirable range <100 mg/dL for primary prevention;   <70 mg/dL for patients with CHD or diabetic patients  with > or = 2 CHD risk factors. Marland Kitchen LDL-C is now calculated using the Martin-Hopkins  calculation, which is a validated novel method providing  better accuracy than the Friedewald equation in the  estimation of LDL-C.  Cresenciano Genre et al. Annamaria Helling. 1950;932(67): 2061-2068  (http://education.[facility].com/faq/FAQ164)    Total CHOL/HDL Ratio 04/27/2022 2.6  <5.0 (calc) Final   Non-HDL Cholesterol (Calc) 04/27/2022 92  <130 mg/dL (calc) Final   Comment: For patients with diabetes plus 1 major ASCVD risk  factor, treating to a non-HDL-C goal of <100 mg/dL  (LDL-C of <70 mg/dL) is considered a therapeutic  option.     Past Medical History:  Diagnosis Date   DDD (degenerative disc disease), lumbar    Dysplastic nevus 02/24/2016   L chest - mild     Dysplastic nevus 08/11/2021   Left xyphoid - moderate   Hypertension    Low back pain    Migraine    Obesity    Post concussion syndrome    Current Outpatient Medications on File Prior to Visit  Medication Sig Dispense Refill   acetaminophen (TYLENOL) 500 MG tablet Take 1,000 mg by mouth every 6 (six) hours as needed for moderate pain.     ALPRAZolam (XANAX) 0.5 MG tablet Take 1 tablet (0.5 mg total) by mouth 3 (three) times daily as needed for anxiety. 90 tablet 0   eletriptan (RELPAX) 40 MG tablet Take 40 mg by mouth daily as needed for migraine. One tablet by mouth at onset of headache. May repeat in 2 hours if headache persists or recurs.     fluocinonide (LIDEX) 0.05 % external solution Apply 1 application topically 2 (two) times daily as needed. 180 mL 3   HYDROcodone-acetaminophen (NORCO/VICODIN) 5-325 MG tablet Take 1 tablet by mouth every 6 (six) hours as needed for moderate pain. 90 tablet 0   hydroxypropyl methylcellulose / hypromellose (ISOPTO TEARS / GONIOVISC) 2.5 % ophthalmic solution Place 1 drop into both eyes daily as needed for dry eyes.     LINZESS 145 MCG CAPS capsule TAKE 1 CAPSULE BY MOUTH DAILY BEFORE BREAKFAST. 30 capsule 11   losartan-hydrochlorothiazide (HYZAAR) 50-12.5 MG tablet TAKE 1 TABLET BY MOUTH EVERY DAY 90 tablet 3   magnesium oxide (MAG-OX) 400 (241.3 MG) MG tablet Take 400-800 mg by mouth daily.     meclizine (ANTIVERT) 25 MG tablet Take 1 tablet (25 mg total) by mouth 3 (three) times daily as needed for dizziness. 30 tablet 0   meloxicam (MOBIC) 7.5 MG tablet Take 1 tablet (7.5 mg total) by mouth daily. (Patient taking differently: Take 7.5-15 mg by mouth daily.) 30 tablet 5   methocarbamol (ROBAXIN) 500 MG tablet Take 500 mg by mouth every 8 (eight) hours as needed for muscle spasms.     mometasone (ELOCON) 0.1 % cream Apply 1 application topically as directed. Qd up to 5 days a week aa rash in buttocks crease 50 g 1   neomycin-polymyxin-hydrocortisone  (CORTISPORIN) OTIC solution Place 3 drops into the left ear 4 (four) times daily. 10 mL 0   predniSONE (DELTASONE) 20 MG tablet 3 tabs poqday 1-2, 2 tabs poqday 3-4,  1 tab poqday 5-6 12 tablet 0   sildenafil (VIAGRA) 100 MG tablet Take 0.5-1 tablets (50-100 mg total) by mouth daily as needed for erectile dysfunction. 5 tablet 11   sulfacetamide (BLEPH-10) 10 % ophthalmic solution      tiZANidine (ZANAFLEX) 4 MG tablet Take 4-8 mg by mouth at bedtime as needed.     triamcinolone (KENALOG) 0.147 MG/GM topical spray Apply topically as directed. Qd up to 5 days a week to aa psoriasis in scalp, avoid face, groin, axilla 63 g 3   UBRELVY 100 MG TABS Take 100 mg by mouth daily as needed (migraine).     valACYclovir (VALTREX) 1000 MG tablet Take 1 tablet (1,000 mg total) by mouth as directed. Take 2 po with first symptoms of fever blister then 2 po 12 hours later 30 tablet 11   verapamil (CALAN-SR) 240 MG CR tablet Take 240 mg by mouth daily.  3   No current facility-administered medications on file prior to visit.   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  Other Topics Concern   Not on file  Social History Narrative   Not on file   Social Determinants of Health   Financial Resource Strain: Not on file  Food Insecurity: Not on file  Transportation Needs: Not on file  Physical Activity: Not on file  Stress: Not on file  Social Connections: Not on file  Intimate Partner Violence: Not on file   Family History  Problem Relation Age of Onset    Diabetes Father    Heart disease Father    Hyperlipidemia Father    Hypertension Father      Review of Systems  All other systems reviewed and are negative.      Objective:   Physical Exam Vitals reviewed.  Constitutional:      General: He is not in acute distress.    Appearance: He is well-developed. He is not diaphoretic.  HENT:     Head: Normocephalic and atraumatic.     Right Ear: External ear normal.     Left Ear: External ear normal.     Nose: Nose normal.     Mouth/Throat:     Pharynx: No oropharyngeal exudate.  Eyes:     General: No scleral icterus.       Right eye: No discharge.        Left eye: No discharge.     Conjunctiva/sclera: Conjunctivae normal.     Pupils: Pupils are equal, round, and reactive to light.  Neck:     Thyroid: No thyromegaly.     Vascular: No JVD.     Trachea: No tracheal deviation.  Cardiovascular:     Rate and Rhythm: Normal rate and regular rhythm.     Heart sounds: Normal heart sounds. No murmur heard.    No friction rub. No gallop.  Pulmonary:     Effort: Pulmonary effort is normal. No respiratory distress.  Breath sounds: Normal breath sounds. No stridor. No wheezing or rales.  Chest:     Chest wall: No tenderness.  Abdominal:     General: Bowel sounds are normal. There is no distension.     Palpations: Abdomen is soft. There is no mass.     Tenderness: There is no abdominal tenderness. There is no guarding or rebound.     Hernia: No hernia is present.  Genitourinary:    Penis: Normal.      Rectum: Normal.  Musculoskeletal:        General: No tenderness or deformity.     Cervical back: Normal range of motion and neck supple.  Lymphadenopathy:     Cervical: No cervical adenopathy.  Skin:    General: Skin is warm.     Coloration: Skin is not pale.     Findings: No rash.  Neurological:     Mental Status: He is alert and oriented to person, place, and time.     Cranial Nerves: No cranial nerve deficit.     Sensory: No  sensory deficit.     Motor: No abnormal muscle tone.     Coordination: Coordination normal.           Assessment & Plan:  Colon cancer screening - Plan: Cologuard  Benign essential HTN  DDD (degenerative disc disease), lumbar  Chronic midline low back pain with right-sided sciatica  General medical exam Patient's blood work is outstanding.  His blood pressure is excellent.  I recommended a flu shot and shingles shot.  I will schedule the patient for Cologuard.  The remainder of his preventative care is up-to-date.  I did strongly recommend considering Wegovy for weight loss.  I believe that this could potentially help his low back pain if we can facilitate weight loss

## 2022-06-02 ENCOUNTER — Telehealth: Payer: Self-pay

## 2022-06-02 ENCOUNTER — Ambulatory Visit
Admission: RE | Admit: 2022-06-02 | Discharge: 2022-06-02 | Disposition: A | Discharge status: Home / Self Care | Payer: BC Managed Care – PPO | Source: Ambulatory Visit | Attending: Family Medicine | Admitting: Family Medicine

## 2022-06-02 ENCOUNTER — Ambulatory Visit (INDEPENDENT_AMBULATORY_CARE_PROVIDER_SITE_OTHER): Payer: BC Managed Care – PPO | Admitting: Family Medicine

## 2022-06-02 VITALS — BP 124/80 | HR 69 | Temp 97.2°F | Ht 71.0 in | Wt 283.2 lb

## 2022-06-02 DIAGNOSIS — K5792 Diverticulitis of intestine, part unspecified, without perforation or abscess without bleeding: Secondary | ICD-10-CM | POA: Diagnosis not present

## 2022-06-02 DIAGNOSIS — R1032 Left lower quadrant pain: Secondary | ICD-10-CM

## 2022-06-02 LAB — CBC WITH DIFFERENTIAL/PLATELET
Absolute Monocytes: 902 cells/uL (ref 200–950)
Basophils Absolute: 86 cells/uL (ref 0–200)
Basophils Relative: 0.9 %
Eosinophils Absolute: 461 cells/uL (ref 15–500)
Eosinophils Relative: 4.8 %
HCT: 42.8 % (ref 38.5–50.0)
Hemoglobin: 14.5 g/dL (ref 13.2–17.1)
Lymphs Abs: 2112 cells/uL (ref 850–3900)
MCH: 29.8 pg (ref 27.0–33.0)
MCHC: 33.9 g/dL (ref 32.0–36.0)
MCV: 87.9 fL (ref 80.0–100.0)
MPV: 10.8 fL (ref 7.5–12.5)
Monocytes Relative: 9.4 %
Neutro Abs: 6038 cells/uL (ref 1500–7800)
Neutrophils Relative %: 62.9 %
Platelets: 318 10*3/uL (ref 140–400)
RBC: 4.87 10*6/uL (ref 4.20–5.80)
RDW: 12.5 % (ref 11.0–15.0)
Total Lymphocyte: 22 %
WBC: 9.6 10*3/uL (ref 3.8–10.8)

## 2022-06-02 MED ORDER — IOPAMIDOL (ISOVUE-300) INJECTION 61%
100.0000 mL | Freq: Once | INTRAVENOUS | Status: AC | PRN
  Administered 2022-06-02: 100 mL via INTRAVENOUS

## 2022-06-02 NOTE — Telephone Encounter (Signed)
Called [facility] to advise pt that PA for CT scan was approved by his insurance. Authorization # of [id number] per Elmon Else at [facility]. Tried to call pt, no answer and voice mail is full.

## 2022-06-02 NOTE — Progress Notes (Signed)
Subjective:    Patient ID: Darren Baker, male    DOB: 1971-01-07, 51 y.o.   MRN: [id number]  HPI Patient recently had to go to an urgent care due to severe left lower quadrant abdominal pain.  He was started on Augmentin for diverticulitis.  He states the pain is approximately 40% better after having been on antibiotics for 4 days.  He denies any additional fever but he continues to have severe left lower quadrant pain.  He is also reporting constipation.  He denies any melena or hematochezia   Past Medical History:  Diagnosis Date   DDD (degenerative disc disease), lumbar    Dysplastic nevus 02/24/2016   L chest - mild    Dysplastic nevus 08/11/2021   Left xyphoid - moderate   Hypertension    Low back pain    Migraine    Obesity    Post concussion syndrome    Current Outpatient Medications on File Prior to Visit  Medication Sig Dispense Refill   acetaminophen (TYLENOL) 500 MG tablet Take 1,000 mg by mouth every 6 (six) hours as needed for moderate pain.     ALPRAZolam (XANAX) 0.5 MG tablet Take 1 tablet (0.5 mg total) by mouth 3 (three) times daily as needed for anxiety. 90 tablet 0   eletriptan (RELPAX) 40 MG tablet Take 40 mg by mouth daily as needed for migraine. One tablet by mouth at onset of headache. May repeat in 2 hours if headache persists or recurs.     fluocinonide (LIDEX) 0.05 % external solution Apply 1 application topically 2 (two) times daily as needed. (Patient not taking: Reported on 05/25/2022) 180 mL 3   HYDROcodone-acetaminophen (NORCO/VICODIN) 5-325 MG tablet Take 1 tablet by mouth every 6 (six) hours as needed for moderate pain. 90 tablet 0   hydroxypropyl methylcellulose / hypromellose (ISOPTO TEARS / GONIOVISC) 2.5 % ophthalmic solution Place 1 drop into both eyes daily as needed for dry eyes. (Patient not taking: Reported on 05/25/2022)     LINZESS 145 MCG CAPS capsule TAKE 1 CAPSULE BY MOUTH DAILY BEFORE BREAKFAST. 30 capsule 11    losartan-hydrochlorothiazide (HYZAAR) 50-12.5 MG tablet TAKE 1 TABLET BY MOUTH EVERY DAY 90 tablet 3   magnesium oxide (MAG-OX) 400 (241.3 MG) MG tablet Take 400-800 mg by mouth daily.     meclizine (ANTIVERT) 25 MG tablet Take 1 tablet (25 mg total) by mouth 3 (three) times daily as needed for dizziness. (Patient not taking: Reported on 05/25/2022) 30 tablet 0   meloxicam (MOBIC) 7.5 MG tablet Take 1 tablet (7.5 mg total) by mouth daily. (Patient taking differently: Take 7.5-15 mg by mouth daily.) 30 tablet 5   methocarbamol (ROBAXIN) 500 MG tablet Take 500 mg by mouth every 8 (eight) hours as needed for muscle spasms.     mometasone (ELOCON) 0.1 % cream Apply 1 application topically as directed. Qd up to 5 days a week aa rash in buttocks crease 50 g 1   neomycin-polymyxin-hydrocortisone (CORTISPORIN) OTIC solution Place 3 drops into the left ear 4 (four) times daily. 10 mL 0   predniSONE (DELTASONE) 20 MG tablet 3 tabs poqday 1-2, 2 tabs poqday 3-4, 1 tab poqday 5-6 12 tablet 0   sildenafil (VIAGRA) 100 MG tablet Take 0.5-1 tablets (50-100 mg total) by mouth daily as needed for erectile dysfunction. 5 tablet 11   sulfacetamide (BLEPH-10) 10 % ophthalmic solution      tiZANidine (ZANAFLEX) 4 MG tablet Take 4-8 mg by mouth at bedtime  as needed.     triamcinolone (KENALOG) 0.147 MG/GM topical spray Apply topically as directed. Qd up to 5 days a week to aa psoriasis in scalp, avoid face, groin, axilla 63 g 3   UBRELVY 100 MG TABS Take 100 mg by mouth daily as needed (migraine).     valACYclovir (VALTREX) 1000 MG tablet Take 1 tablet (1,000 mg total) by mouth as directed. Take 2 po with first symptoms of fever blister then 2 po 12 hours later 30 tablet 11   verapamil (CALAN-SR) 240 MG CR tablet Take 240 mg by mouth daily.  3   No current facility-administered medications on file prior to visit.   No Known Allergies Social History   Socioeconomic History   Marital status: Single    Spouse name: Not  on file   Number of children: Not on file   Years of education: Not on file   Highest education level: Not on file  Occupational History   Not on file  Tobacco Use   Smoking status: Never   Smokeless tobacco: Never  Substance and Sexual Activity   Alcohol use: Yes    Comment: Rare   Drug use: No   Sexual activity: Not on file    Comment: Married, works for Building [employer].  Other Topics Concern   Not on file  Social History Narrative   Not on file   Social Determinants of Health   Financial Resource Strain: Not on file  Food Insecurity: Not on file  Transportation Needs: Not on file  Physical Activity: Not on file  Stress: Not on file  Social Connections: Not on file  Intimate Partner Violence: Not on file   Family History  Problem Relation Age of Onset   Diabetes Father    Heart disease Father    Hyperlipidemia Father    Hypertension Father      Review of Systems  All other systems reviewed and are negative.      Objective:   Physical Exam Vitals reviewed.  Constitutional:      General: He is not in acute distress.    Appearance: He is well-developed. He is not diaphoretic.  HENT:     Head: Normocephalic and atraumatic.     Right Ear: External ear normal.     Left Ear: External ear normal.     Nose: Nose normal.     Mouth/Throat:     Pharynx: No oropharyngeal exudate.  Eyes:     General: No scleral icterus.       Right eye: No discharge.        Left eye: No discharge.     Conjunctiva/sclera: Conjunctivae normal.     Pupils: Pupils are equal, round, and reactive to light.  Neck:     Thyroid: No thyromegaly.     Vascular: No JVD.     Trachea: No tracheal deviation.  Cardiovascular:     Rate and Rhythm: Normal rate and regular rhythm.     Heart sounds: Normal heart sounds. No murmur heard.    No friction rub. No gallop.  Pulmonary:     Effort: Pulmonary effort is normal. No respiratory distress.     Breath sounds: Normal breath sounds. No stridor.  No wheezing or rales.  Chest:     Chest wall: No tenderness.  Abdominal:     General: Bowel sounds are normal. There is no distension.     Palpations: Abdomen is soft. There is no mass.     Tenderness:  There is abdominal tenderness in the left lower quadrant. There is no guarding or rebound.     Hernia: No hernia is present.    Genitourinary:    Penis: Normal.      Rectum: Normal.  Musculoskeletal:        General: No tenderness or deformity.     Cervical back: Normal range of motion and neck supple.  Lymphadenopathy:     Cervical: No cervical adenopathy.  Skin:    General: Skin is warm.     Coloration: Skin is not pale.     Findings: No rash.  Neurological:     Mental Status: He is alert and oriented to person, place, and time.     Cranial Nerves: No cranial nerve deficit.     Sensory: No sensory deficit.     Motor: No abnormal muscle tone.     Coordination: Coordination normal.           Assessment & Plan:  Diverticulitis - Plan: CBC with Differential/Platelet, CT Abdomen Pelvis W Contrast  Left lower quadrant abdominal pain - Plan: CT Abdomen Pelvis W Contrast Clinically I believe the patient was having diverticulitis.  Thankfully he is improving gradually on Augmentin.  I will get a CT scan to confirm.  At the urgent care his white blood cell count was greater than 15 with predominance of neutrophils.  I will repeat that today.  Once the diverticulitis has resolved, I would recommend scheduling for colonoscopy.  Recommended taking MiraLAX daily to prevent constipation

## 2022-06-06 ENCOUNTER — Ambulatory Visit: Payer: BC Managed Care – PPO | Admitting: Family Medicine

## 2022-06-06 ENCOUNTER — Other Ambulatory Visit: Payer: Self-pay

## 2022-06-26 LAB — COLOGUARD

## 2022-07-11 LAB — COLOGUARD
COLOGUARD: NEGATIVE
Cologuard: NEGATIVE

## 2022-08-01 ENCOUNTER — Encounter: Payer: Self-pay | Admitting: Family Medicine

## 2022-08-11 ENCOUNTER — Encounter: Payer: Self-pay | Admitting: Family Medicine

## 2022-08-11 ENCOUNTER — Ambulatory Visit: Payer: BC Managed Care – PPO | Admitting: Family Medicine

## 2022-08-11 VITALS — BP 130/82 | HR 75 | Temp 97.9°F | Ht 71.0 in | Wt 278.0 lb

## 2022-08-11 DIAGNOSIS — R361 Hematospermia: Secondary | ICD-10-CM

## 2022-08-11 NOTE — Progress Notes (Signed)
Subjective:    Patient ID: Darren Baker, male    DOB: 11-03-1970, 51 y.o.   MRN: [id number]  HPI' Patient is here today concerned about brown semen.  He states that he was recently having relations with his wife when he noticed that the semen was brownish in color.  He states that it happened again later in the week.  He denies any bright red blood.  He denies any dysuria or urgency or frequency.  His last PSA was in April 2022 and was normal.  He does have a history of trace hematuria.  He was obviously concerned by the discoloration in the semen.  He denies any frequency or urgency or fever or chills Past Medical History:  Diagnosis Date   DDD (degenerative disc disease), lumbar    Dysplastic nevus 02/24/2016   L chest - mild    Dysplastic nevus 08/11/2021   Left xyphoid - moderate   Hypertension    Low back pain    Migraine    Obesity    Post concussion syndrome    Current Outpatient Medications on File Prior to Visit  Medication Sig Dispense Refill   acetaminophen (TYLENOL) 500 MG tablet Take 1,000 mg by mouth every 6 (six) hours as needed for moderate pain.     ALPRAZolam (XANAX) 0.5 MG tablet Take 1 tablet (0.5 mg total) by mouth 3 (three) times daily as needed for anxiety. 90 tablet 0   eletriptan (RELPAX) 40 MG tablet Take 40 mg by mouth daily as needed for migraine. One tablet by mouth at onset of headache. May repeat in 2 hours if headache persists or recurs.     HYDROcodone-acetaminophen (NORCO/VICODIN) 5-325 MG tablet Take 1 tablet by mouth every 6 (six) hours as needed for moderate pain. 90 tablet 0   LINZESS 145 MCG CAPS capsule TAKE 1 CAPSULE BY MOUTH DAILY BEFORE BREAKFAST. 30 capsule 11   losartan-hydrochlorothiazide (HYZAAR) 50-12.5 MG tablet TAKE 1 TABLET BY MOUTH EVERY DAY 90 tablet 3   magnesium oxide (MAG-OX) 400 (241.3 MG) MG tablet Take 400-800 mg by mouth daily.     meloxicam (MOBIC) 7.5 MG tablet Take 1 tablet (7.5 mg total) by mouth daily. (Patient taking  differently: Take 7.5-15 mg by mouth daily.) 30 tablet 5   methocarbamol (ROBAXIN) 500 MG tablet Take 500 mg by mouth every 8 (eight) hours as needed for muscle spasms.     mometasone (ELOCON) 0.1 % cream Apply 1 application topically as directed. Qd up to 5 days a week aa rash in buttocks crease 50 g 1   neomycin-polymyxin-hydrocortisone (CORTISPORIN) OTIC solution Place 3 drops into the left ear 4 (four) times daily. 10 mL 0   predniSONE (DELTASONE) 20 MG tablet 3 tabs poqday 1-2, 2 tabs poqday 3-4, 1 tab poqday 5-6 12 tablet 0   sulfacetamide (BLEPH-10) 10 % ophthalmic solution      tiZANidine (ZANAFLEX) 4 MG tablet Take 4-8 mg by mouth at bedtime as needed.     triamcinolone (KENALOG) 0.147 MG/GM topical spray Apply topically as directed. Qd up to 5 days a week to aa psoriasis in scalp, avoid face, groin, axilla 63 g 3   UBRELVY 100 MG TABS Take 100 mg by mouth daily as needed (migraine).     verapamil (CALAN-SR) 240 MG CR tablet Take 240 mg by mouth daily.  3   amoxicillin-clavulanate (AUGMENTIN) 875-125 MG tablet Take 1 tablet by mouth 2 (two) times daily. (Patient not taking: Reported on 08/11/2022)  fluocinonide (LIDEX) 0.05 % external solution Apply 1 application topically 2 (two) times daily as needed. (Patient not taking: Reported on 08/11/2022) 180 mL 3   hydroxypropyl methylcellulose / hypromellose (ISOPTO TEARS / GONIOVISC) 2.5 % ophthalmic solution Place 1 drop into both eyes daily as needed for dry eyes. (Patient not taking: Reported on 08/11/2022)     meclizine (ANTIVERT) 25 MG tablet Take 1 tablet (25 mg total) by mouth 3 (three) times daily as needed for dizziness. (Patient not taking: Reported on 08/11/2022) 30 tablet 0   sildenafil (VIAGRA) 100 MG tablet Take 0.5-1 tablets (50-100 mg total) by mouth daily as needed for erectile dysfunction. (Patient not taking: Reported on 08/11/2022) 5 tablet 11   valACYclovir (VALTREX) 1000 MG tablet Take 1 tablet (1,000 mg total) by mouth as  directed. Take 2 po with first symptoms of fever blister then 2 po 12 hours later (Patient not taking: Reported on 08/11/2022) 30 tablet 11   No current facility-administered medications on file prior to visit.   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  Other Topics Concern   Not on file  Social History Narrative   Not on file   Social Determinants of Health   Financial Resource Strain: Not on file  Food Insecurity: Not on file  Transportation Needs: Not on file  Physical Activity: Not on file  Stress: Not on file  Social Connections: Not on file  Intimate Partner Violence: Not on file   Family History  Problem Relation Age of Onset   Diabetes Father    Heart disease Father    Hyperlipidemia Father    Hypertension Father      Review of Systems  All other systems reviewed and are negative.      Objective:   Physical Exam Vitals reviewed.  Constitutional:      General: He is not in acute distress.    Appearance: He is well-developed. He is not diaphoretic.  HENT:     Head: Normocephalic and atraumatic.  Neck:     Thyroid: No thyromegaly.     Vascular: No JVD.     Trachea: No tracheal deviation.  Cardiovascular:     Rate and Rhythm: Normal rate and regular rhythm.     Heart sounds: Normal heart sounds. No murmur heard.    No friction rub. No gallop.  Pulmonary:     Effort: Pulmonary effort is normal. No respiratory distress.     Breath sounds: Normal breath sounds. No stridor. No wheezing or rales.  Chest:     Chest  wall: No tenderness.  Abdominal:     General: Bowel sounds are normal.     Palpations: Abdomen is soft.     Tenderness: There is no guarding or rebound.    Genitourinary:    Penis: Normal.      Testes: Normal.  Musculoskeletal:     Cervical back: Normal range of motion and neck supple.  Lymphadenopathy:     Cervical: No cervical adenopathy.  Skin:    Findings: No rash.  Neurological:     Mental Status: He is alert.     Motor: No abnormal muscle tone.           Assessment & Plan:  Hematospermia - Plan: Urinalysis, Routine w reflex microscopic, PSA I believe the patient was dealing with hematospermia.  I will check a urinalysis to evaluate for any gross hematuria.  I will check a PSA to screen for prostate cancer.  I recommended clinical monitoring over the next week.  The situation resolves and no additional discoloration is seen and the patient is asymptomatic no further work-up is necessary.  If he develops gross hematuria or worsening hematospermia, I will consult urology.

## 2022-08-12 LAB — URINALYSIS, ROUTINE W REFLEX MICROSCOPIC
Bilirubin Urine: NEGATIVE
Glucose, UA: NEGATIVE
Hgb urine dipstick: NEGATIVE
Ketones, ur: NEGATIVE
Leukocytes,Ua: NEGATIVE
Nitrite: NEGATIVE
Protein, ur: NEGATIVE
Specific Gravity, Urine: 1.006 (ref 1.001–1.035)
pH: 6.5 (ref 5.0–8.0)

## 2022-08-12 LAB — PSA: PSA: 0.76 ng/mL (ref <=4.00)

## 2022-08-15 IMAGING — DX DG FOOT COMPLETE 3+V*R*
3 series · 3 of 3 positions shown · non-contrast
Comparison: None.

CLINICAL DATA: Right foot pain.

EXAM:
RIGHT FOOT COMPLETE - 3 VIEW

[foot ap wb]
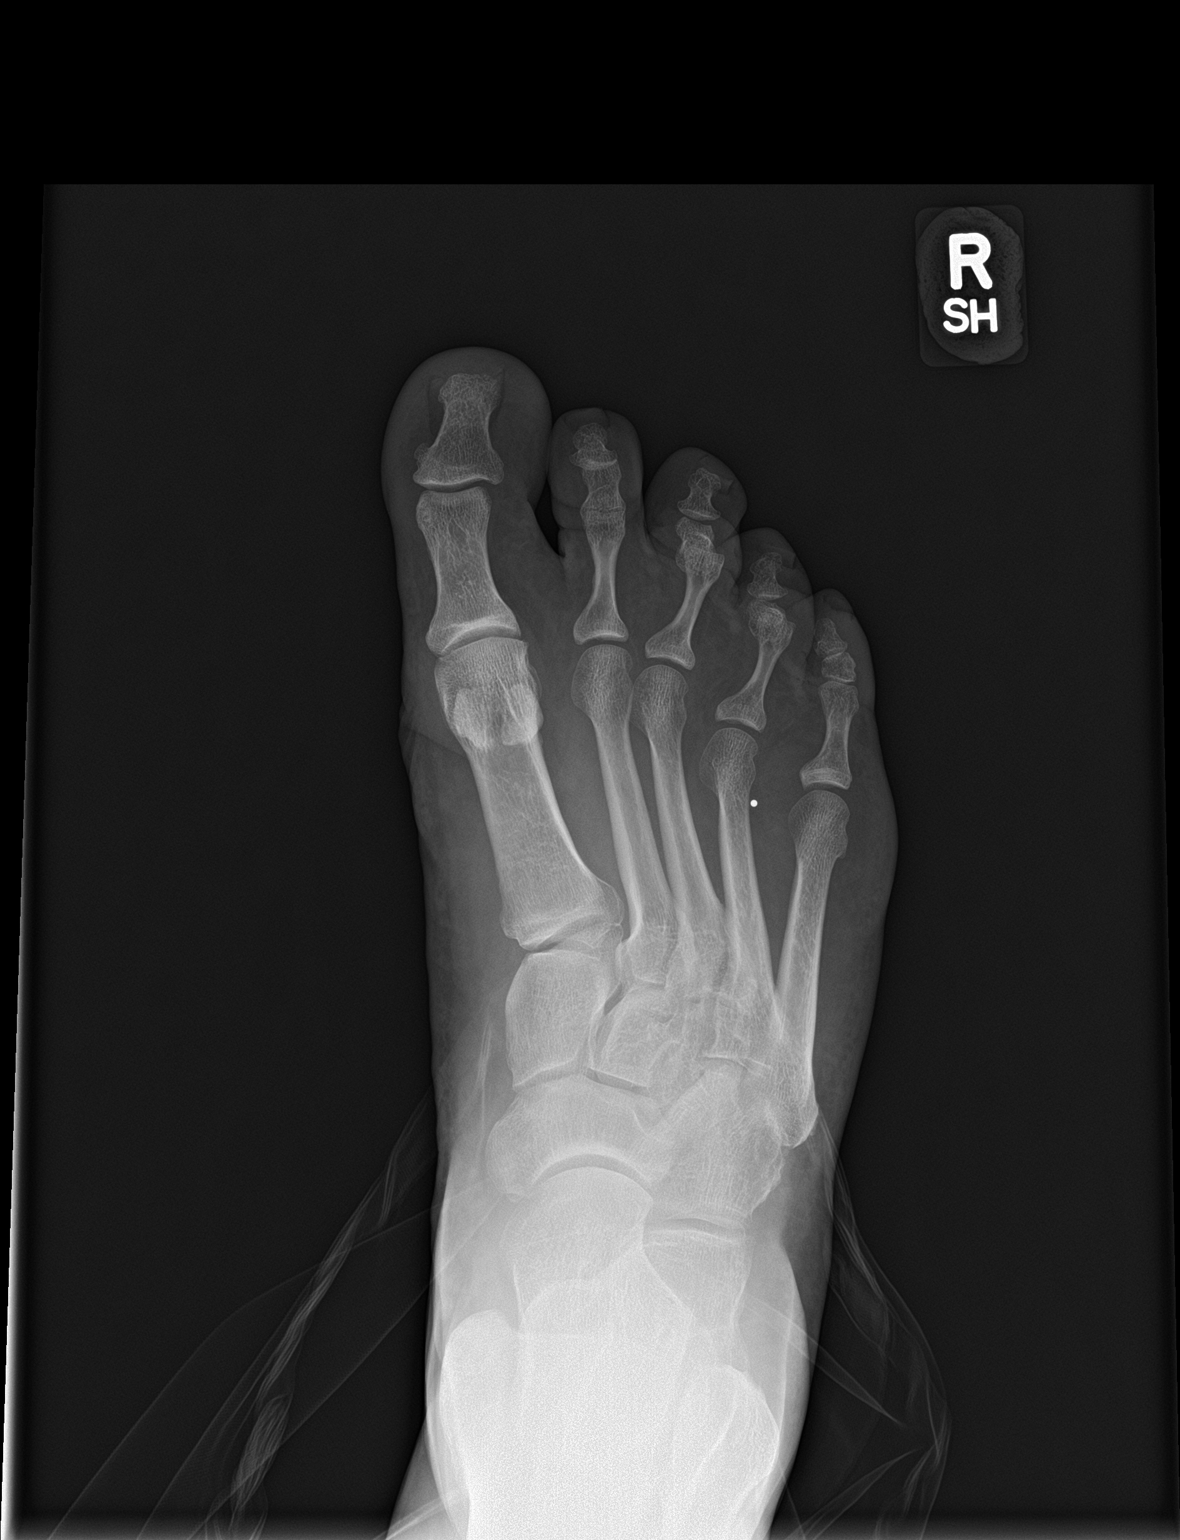

[foot obl wb]
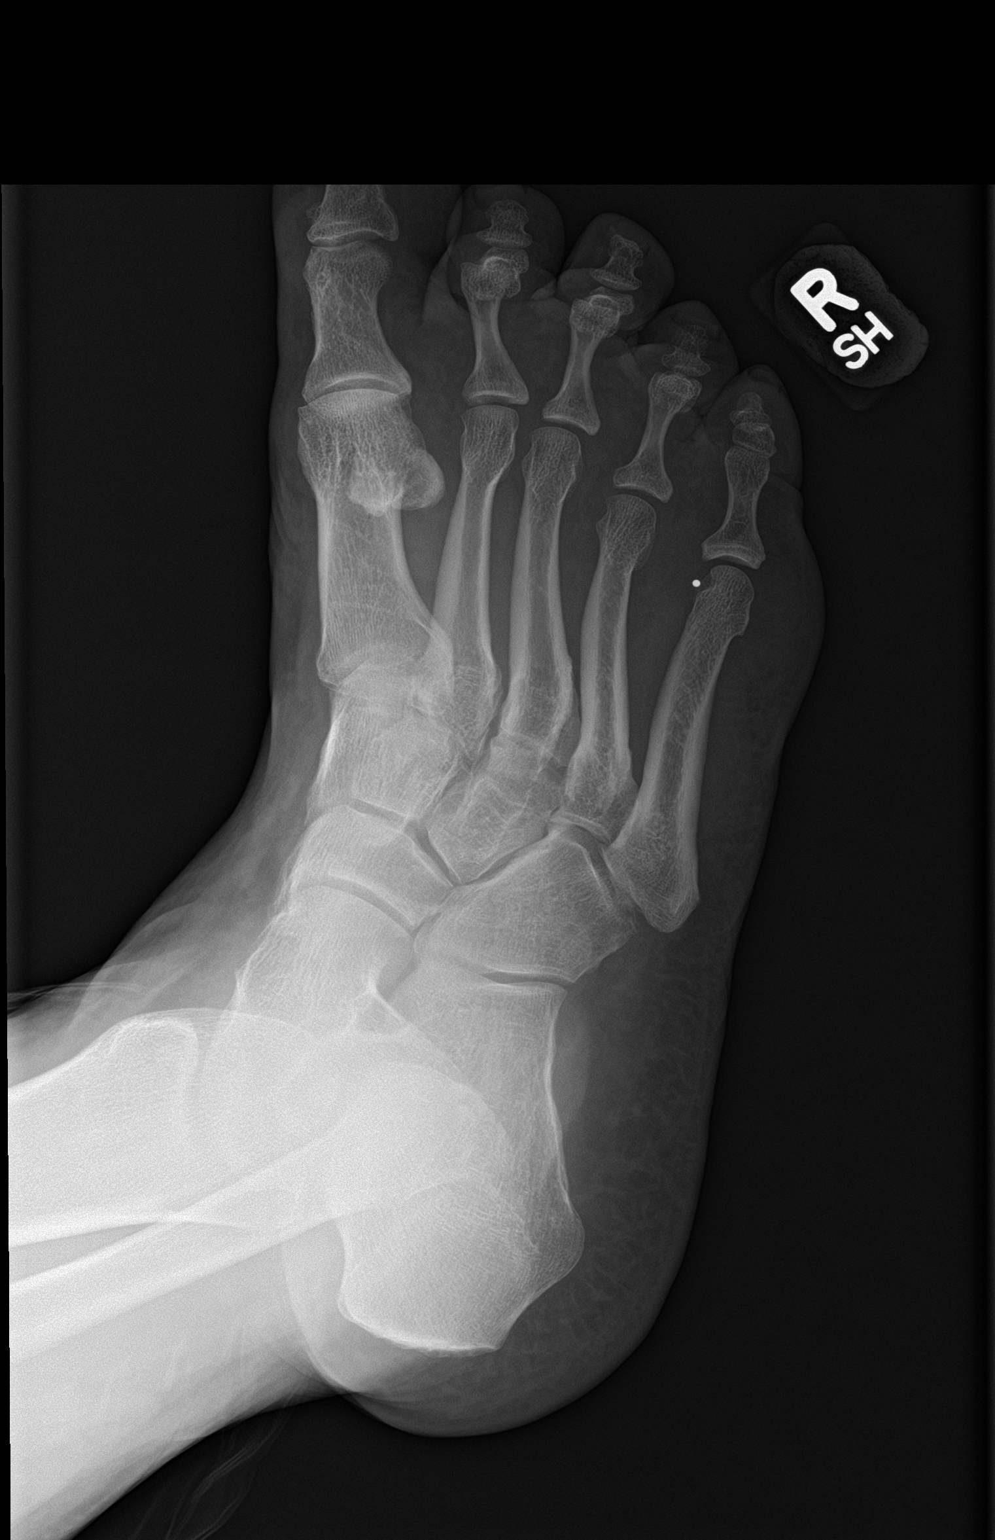

[foot lat wb]
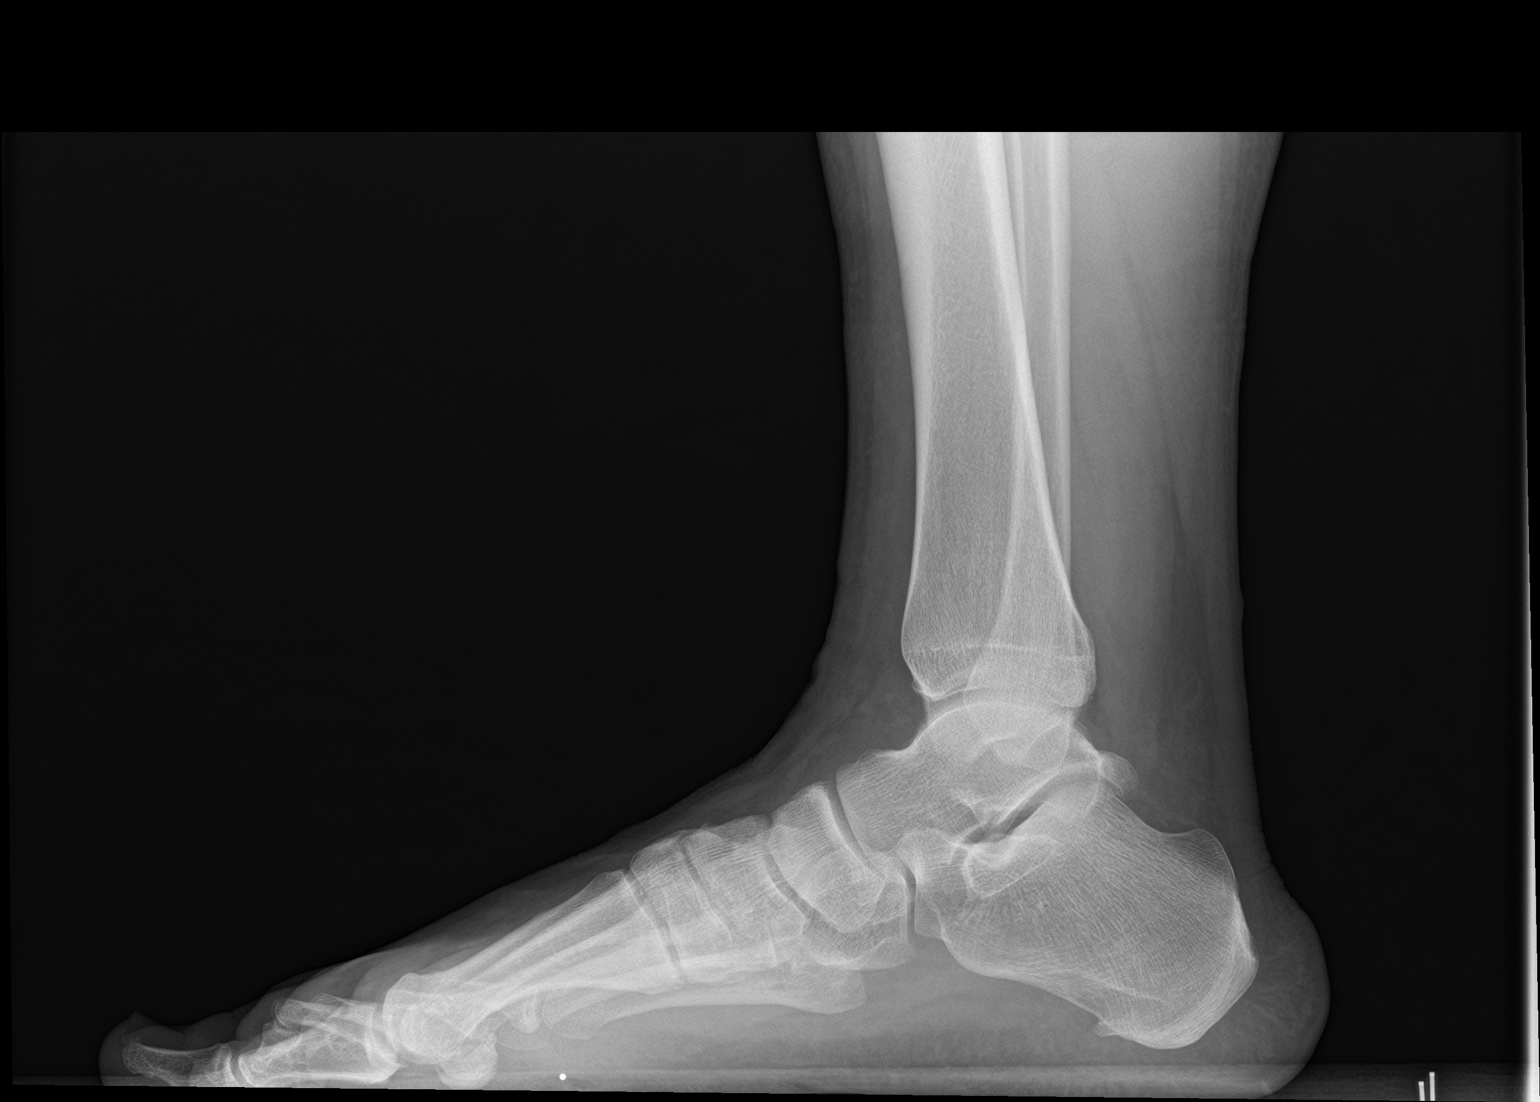

[3 of 3 positions shown; findings below may reference images not displayed]

FINDINGS: There is no evidence of fracture or dislocation. Small plantar
calcaneal spur is identified. Soft tissues are unremarkable.
IMPRESSION: No acute abnormality identified.

## 2022-08-18 ENCOUNTER — Other Ambulatory Visit: Payer: Self-pay | Admitting: Family Medicine

## 2022-08-18 DIAGNOSIS — I10 Essential (primary) hypertension: Secondary | ICD-10-CM

## 2022-08-27 ENCOUNTER — Other Ambulatory Visit: Payer: Self-pay | Admitting: Family Medicine

## 2022-08-27 DIAGNOSIS — I10 Essential (primary) hypertension: Secondary | ICD-10-CM

## 2022-08-28 ENCOUNTER — Other Ambulatory Visit: Payer: Self-pay

## 2022-08-28 DIAGNOSIS — I10 Essential (primary) hypertension: Secondary | ICD-10-CM

## 2022-08-28 MED ORDER — LOSARTAN POTASSIUM-HCTZ 50-12.5 MG PO TABS: 1.0000 | ORAL_TABLET | Freq: Every day | ORAL | 3 refills | Status: DC

## 2022-09-21 ENCOUNTER — Other Ambulatory Visit: Payer: Self-pay | Admitting: Family Medicine

## 2022-09-21 NOTE — Telephone Encounter (Signed)
PT NEED CPE APPT W/PCP FOR FUTURE REFILLS  

## 2022-10-23 DIAGNOSIS — M4726 Other spondylosis with radiculopathy, lumbar region: Secondary | ICD-10-CM | POA: Insufficient documentation

## 2022-11-17 ENCOUNTER — Other Ambulatory Visit: Payer: Self-pay | Admitting: Family Medicine

## 2022-11-20 NOTE — Telephone Encounter (Signed)
Requested medications are due for refill today.  Provider to decide  Requested medications are on the active medications list.  yes  Last refill. 08/18/2022 #90 0 rf  Future visit scheduled.   no  Notes to clinic.  Refill not delegated.    Requested Prescriptions  Pending Prescriptions Disp Refills   ALPRAZolam (XANAX) 0.5 MG tablet [Pharmacy Med Name: ALPRAZOLAM 0.5 MG TABLET] 90 tablet 0    Sig: TAKE 1 TABLET (0.5 MG TOTAL) BY MOUTH 3 (THREE) TIMES DAILY AS NEEDED FOR ANXIETY.     Not Delegated - Psychiatry: Anxiolytics/Hypnotics 2 Failed - 11/17/2022  3:17 PM      Failed - This refill cannot be delegated      Failed - Urine Drug Screen completed in last 360 days      Failed - Valid encounter within last 6 months    Recent Outpatient Visits           10 months ago Metatarsalgia of left foot   [place] Pickard, Cammie Mcgee, MD   1 year ago Bursitis of right foot   [place] Pickard, Cammie Mcgee, MD   1 year ago Blepharitis of left lower eyelid, unspecified type   [place] Pickard, Cammie Mcgee, MD   1 year ago Blepharitis of left lower eyelid, unspecified type   [place] Eulogio Bear, NP   1 year ago Viral gastroenteritis   [place] Pickard, Cammie Mcgee, MD       Future Appointments             In 1 week Ralene Bathe, MD [place] - Patient is not pregnant

## 2022-11-27 ENCOUNTER — Ambulatory Visit: Payer: BC Managed Care – PPO | Admitting: Dermatology

## 2022-11-29 ENCOUNTER — Ambulatory Visit: Payer: BC Managed Care – PPO | Admitting: Dermatology

## 2022-11-29 VITALS — BP 151/93

## 2022-11-29 DIAGNOSIS — B009 Herpesviral infection, unspecified: Secondary | ICD-10-CM | POA: Diagnosis not present

## 2022-11-29 DIAGNOSIS — L409 Psoriasis, unspecified: Secondary | ICD-10-CM

## 2022-11-29 DIAGNOSIS — D229 Melanocytic nevi, unspecified: Secondary | ICD-10-CM

## 2022-11-29 DIAGNOSIS — Z86018 Personal history of other benign neoplasm: Secondary | ICD-10-CM

## 2022-11-29 DIAGNOSIS — L821 Other seborrheic keratosis: Secondary | ICD-10-CM

## 2022-11-29 DIAGNOSIS — I8393 Asymptomatic varicose veins of bilateral lower extremities: Secondary | ICD-10-CM | POA: Diagnosis not present

## 2022-11-29 DIAGNOSIS — Z79899 Other long term (current) drug therapy: Secondary | ICD-10-CM

## 2022-11-29 DIAGNOSIS — L820 Inflamed seborrheic keratosis: Secondary | ICD-10-CM | POA: Diagnosis not present

## 2022-11-29 DIAGNOSIS — Z1283 Encounter for screening for malignant neoplasm of skin: Secondary | ICD-10-CM

## 2022-11-29 DIAGNOSIS — L570 Actinic keratosis: Secondary | ICD-10-CM

## 2022-11-29 DIAGNOSIS — L578 Other skin changes due to chronic exposure to nonionizing radiation: Secondary | ICD-10-CM

## 2022-11-29 DIAGNOSIS — L814 Other melanin hyperpigmentation: Secondary | ICD-10-CM

## 2022-11-29 DIAGNOSIS — D1801 Hemangioma of skin and subcutaneous tissue: Secondary | ICD-10-CM

## 2022-11-29 MED ORDER — ZORYVE 0.3 % EX CREA: 1.0000 "application " | TOPICAL_CREAM | Freq: Every day | CUTANEOUS | 6 refills | Status: DC

## 2022-11-29 NOTE — Patient Instructions (Addendum)
Your prescription was sent to [facility] in [place]. A representative from [facility] will contact you within 3 business hours to verify your address and insurance information to schedule a free delivery. If for any reason you do not receive a phone call from them, please reach out to them. Their phone number is [phone] and their hours are Monday-Friday 9:00 am-5:00 pm.     Cryotherapy Aftercare  Wash gently with soap and water everyday.   Apply Vaseline and Band-Aid daily until healed.    Due to recent changes in healthcare laws, you may see results of your pathology and/or laboratory studies on MyChart before the doctors have had a chance to review them. We understand that in some cases there may be results that are confusing or concerning to you. Please understand that not all results are received at the same time and often the doctors may need to interpret multiple results in order to provide you with the best plan of care or course of treatment. Therefore, we ask that you please give [place] 2 business days to thoroughly review all your results before contacting the office for clarification. Should we see a critical lab result, you will be contacted sooner.   If You Need Anything After Your Visit  If you have any questions or concerns for your doctor, please call our main line at [phone] and press option 4 to reach your doctor's medical assistant. If no one answers, please leave a voicemail as directed and we will return your call as soon as possible. Messages left after 4 pm will be answered the following business day.   You may also send [place] a message via [place]. We typically respond to MyChart messages within 1-2 business days.  For prescription refills, please ask your pharmacy to contact our office. Our fax number is [phone].  If you have an urgent issue when the clinic is closed that cannot wait until the next business day, you can page your doctor at the number  below.    Please note that while we do our best to be available for urgent issues outside of office hours, we are not available 24/7.   If you have an urgent issue and are unable to reach [place], you may choose to seek medical care at your doctor's office, retail clinic, urgent care center, or emergency room.  If you have a medical emergency, please immediately call 911 or go to the emergency department.  Pager Numbers  - Dr. Nehemiah Massed: [phone]  - Dr. Laurence Ferrari: [phone]  - Dr. Nicole Kindred: [phone]  In the event of inclement weather, please call our main line at [phone] for an update on the status of any delays or closures.  Dermatology Medication Tips: Please keep the boxes that topical medications come in in order to help keep track of the instructions about where and how to use these. Pharmacies typically print the medication instructions only on the boxes and not directly on the medication tubes.   If your medication is too expensive, please contact our office at [phone] option 4 or send [place] a message through [place].   We are unable to tell what your co-pay for medications will be in advance as this is different depending on your insurance coverage. However, we may be able to find a substitute medication at lower cost or fill out paperwork to get insurance to cover a needed medication.   If a prior authorization is required to get your medication covered by your insurance company,  please allow [place] 1-2 business days to complete this process.  Drug prices often vary depending on where the prescription is filled and some pharmacies may offer cheaper prices.  The website www.goodrx.com contains coupons for medications through different pharmacies. The prices here do not account for what the cost may be with help from insurance (it may be cheaper with your insurance), but the website can give you the price if you did not use any insurance.  - You can print the associated coupon  and take it with your prescription to the pharmacy.  - You may also stop by our office during regular business hours and pick up a GoodRx coupon card.  - If you need your prescription sent electronically to a different pharmacy, notify our office through [facility] or by phone at [phone] option 4.     Si Usted Necesita Algo Despus de Su Visita  Tambin puede enviarnos un mensaje a travs de Pharmacist, community. Por lo general respondemos a los mensajes de MyChart en el transcurso de 1 a 2 das hbiles.  Para renovar recetas, por favor pida a su farmacia que se ponga en contacto con nuestra oficina. Harland Dingwall de fax es Carlos [phone].  Si tiene un asunto urgente cuando la clnica est cerrada y que no puede esperar hasta el siguiente da hbil, puede llamar/localizar a su doctor(a) al nmero que aparece a continuacin.   Por favor, tenga en cuenta que aunque hacemos todo lo posible para estar disponibles para asuntos urgentes fuera del horario de [place], no estamos disponibles las 24 horas del da, los 7 das de la [place].   Si tiene un problema urgente y no puede comunicarse con nosotros, puede optar por buscar atencin mdica  en el consultorio de su doctor(a), en una clnica privada, en un centro de atencin urgente o en una sala de emergencias.  Si tiene Engineering geologist, por favor llame inmediatamente al 911 o vaya a la sala de emergencias.  Nmeros de bper  - Dr. Nehemiah Massed: [phone]  - Dra. Moye: [phone]  - Dra. Nicole Kindred: [phone]  En caso de inclemencias del [place], por favor llame a Johnsie Kindred principal al [phone] para una actualizacin sobre el [place] de cualquier retraso o cierre.  Consejos para la medicacin en dermatologa: Por favor, guarde las cajas en las que vienen los medicamentos de uso tpico para ayudarle a seguir las instrucciones sobre dnde y cmo usarlos. Las farmacias generalmente imprimen las instrucciones del medicamento  slo en las cajas y no directamente en los tubos del [place].   Si su medicamento es muy caro, por favor, pngase en contacto con Zigmund Daniel llamando al [phone] y presione la opcin 4 o envenos un mensaje a travs de Pharmacist, community.   No podemos decirle cul ser su copago por los medicamentos por adelantado ya que esto es diferente dependiendo de la cobertura de su seguro. Sin embargo, es posible que podamos encontrar un medicamento sustituto a Electrical engineer un formulario para que el seguro cubra el medicamento que se considera necesario.   Si se requiere una autorizacin previa para que su compaa de seguros [place] su medicamento, por favor permtanos de 1 a 2 das hbiles para completar este proceso.  Los precios de los medicamentos varan con frecuencia dependiendo del Environmental consultant de dnde se surte la receta y alguna farmacias pueden ofrecer precios ms baratos.  El sitio web www.goodrx.com tiene cupones para medicamentos de Airline pilot. Los precios aqu no tienen en cuenta lo  que podra costar con la ayuda del seguro (puede ser ms barato con su seguro), pero el sitio web puede darle el precio si no Field seismologist.  - Puede imprimir el cupn correspondiente y llevarlo con su receta a la farmacia.  - Tambin puede pasar por nuestra oficina durante el horario de atencin regular y Charity fundraiser una tarjeta de cupones de GoodRx.  - Si necesita que su receta se enve electrnicamente a una farmacia diferente, informe a nuestra oficina a travs de MyChart de [place] o por telfono llamando al [phone] y presione la opcin 4.

## 2022-11-29 NOTE — Progress Notes (Signed)
Follow-Up Visit   Subjective  Darren Baker is a 52 y.o. male who presents for the following: Annual Exam (History of dysplastic nevus - The patient presents for Total-Body Skin Exam (TBSE) for skin cancer screening and mole check.  The patient has spots, moles and lesions to be evaluated, some may be new or changing and the patient has concerns that these could be cancer./).  The following portions of the chart were reviewed this encounter and updated as appropriate:   Tobacco  Allergies  Meds  Problems  Med Hx  Surg Hx  Fam Hx     Review of Systems:  No other skin or systemic complaints except as noted in HPI or Assessment and Plan.  Objective  Well appearing patient in no apparent distress; mood and affect are within normal limits.  A full examination was performed including scalp, head, eyes, ears, nose, lips, neck, chest, axillae, abdomen, back, buttocks, bilateral upper extremities, bilateral lower extremities, hands, feet, fingers, toes, fingernails, and toenails. All findings within normal limits unless otherwise noted below.  Right volar forearm x 1, right forehead hairline x 1, right temple x 1, right ear concha x 1 (4) Erythematous stuck-on, waxy papule or plaque  Face Erythematous thin papules/macules with gritty scale.   Gluteal Crease Clear today  Legs Varicose veins   Assessment & Plan   History of Dysplastic Nevi - No evidence of recurrence today - Recommend regular full body skin exams - Recommend daily broad spectrum sunscreen SPF 30+ to sun-exposed areas, reapply every 2 hours as needed.  - Call if any new or changing lesions are noted between office visits  Lentigines - Scattered tan macules - Due to sun exposure - Benign-appearing, observe - Recommend daily broad spectrum sunscreen SPF 30+ to sun-exposed areas, reapply every 2 hours as needed. - Call for any changes  Seborrheic Keratoses - Stuck-on, waxy, tan-brown papules and/or plaques   - Benign-appearing - Discussed benign etiology and prognosis. - Observe - Call for any changes  Melanocytic Nevi - Tan-brown and/or pink-flesh-colored symmetric macules and papules - Benign appearing on exam today - Observation - Call clinic for new or changing moles - Recommend daily use of broad spectrum spf 30+ sunscreen to sun-exposed areas.   Hemangiomas - Red papules - Discussed benign nature - Observe - Call for any changes  Actinic Damage - Chronic condition, secondary to cumulative UV/sun exposure - diffuse scaly erythematous macules with underlying dyspigmentation - Recommend daily broad spectrum sunscreen SPF 30+ to sun-exposed areas, reapply every 2 hours as needed.  - Staying in the shade or wearing long sleeves, sun glasses (UVA+UVB protection) and wide brim hats (4-inch brim around the entire circumference of the hat) are also recommended for sun protection.  - Call for new or changing lesions.  Skin cancer screening performed today.  Inflamed seborrheic keratosis (4) Right volar forearm x 1, right forehead hairline x 1, right temple x 1, right ear concha x 1 Destruction of lesion - Right volar forearm x 1, right forehead hairline x 1, right temple x 1, right ear concha x 1 Complexity: simple   Destruction method: cryotherapy   Informed consent: discussed and consent obtained   Timeout:  patient name, date of birth, surgical site, and procedure verified Lesion destroyed using liquid nitrogen: Yes   Region frozen until ice ball extended beyond lesion: Yes   Outcome: patient tolerated procedure well with no complications   Post-procedure details: wound care instructions given  AK (actinic keratosis) Face Destruction of lesion - Face Complexity: simple   Destruction method: cryotherapy   Informed consent: discussed and consent obtained   Timeout:  patient name, date of birth, surgical site, and procedure verified Lesion destroyed using liquid nitrogen:  Yes   Region frozen until ice ball extended beyond lesion: Yes   Outcome: patient tolerated procedure well with no complications   Post-procedure details: wound care instructions given    Psoriasis Scalp Counseling on psoriasis and coordination of care  psoriasis is a chronic non-curable, but treatable genetic/hereditary disease that may have other systemic features affecting other organ systems such as joints (Psoriatic Arthritis). It is associated with an increased risk of inflammatory bowel disease, heart disease, non-alcoholic fatty liver disease, and depression.  Treatments include light and laser treatments; topical medications; and systemic medications including oral and injectables.  Zoryve cream daily affected area of scalp for psoriasis   Roflumilast (ZORYVE) 0.3 % CREA - Scalp Apply 1 application  topically daily.  Related Medications fluocinonide (LIDEX) 0.05 % external solution Apply 1 application topically 2 (two) times daily as needed.  triamcinolone (KENALOG) 0.147 MG/GM topical spray Apply topically as directed. Qd up to 5 days a week to aa psoriasis in scalp, avoid face, groin, axilla  HSV infection Gluteal Crease History of HSV - clear today Continue Valtrex 1 g prn flares Herpes Simplex Virus = Cold Sores = Fever Blisters is a chronic recurring blistering; scabbing sore-producing viral infection that is recurrent usually in the same area triggered by stress, sun/UV exposure and trauma.  It is infectious and can be spread from person to person by direct contact.  It is not curable, but is treatable with topical and oral medication.  Asymptomatic varicose veins of both lower extremities Legs Benign-appearing.  Observation.  Call clinic for new or changing lesions.  Recommend daily use of broad spectrum spf 30+ sunscreen to sun-exposed areas.   Return in about 1 year (around 11/30/2023) for TBSE.  I, Ashok Cordia, CMA, am acting as scribe for Sarina Ser, MD  . Documentation: I have reviewed the above documentation for accuracy and completeness, and I agree with the above.  Sarina Ser, MD

## 2022-12-06 ENCOUNTER — Encounter: Payer: Self-pay | Admitting: Dermatology

## 2022-12-13 ENCOUNTER — Other Ambulatory Visit: Payer: Self-pay | Admitting: Family Medicine

## 2022-12-13 NOTE — Telephone Encounter (Signed)
Prescription Request  12/13/2022  LOV: 08/11/2022  What is the name of the medication or equipment? Linzess 145 MCG capsule  Have you contacted your pharmacy to request a refill? Yes   Which pharmacy would you like this sent to?  [id number]: [phone]: [phone]   Patient notified that their request is being sent to the clinical staff for review and that they should receive a response within 2 business days.   [phone]

## 2022-12-14 MED ORDER — LINACLOTIDE 145 MCG PO CAPS: ORAL_CAPSULE | ORAL | 0 refills | Status: DC

## 2022-12-14 NOTE — Telephone Encounter (Signed)
Requested medication (s) are due for refill today: yes  Requested medication (s) are on the active medication list: yes  Last refill:  09/23/22  Future visit scheduled: no  Notes to clinic:  Unable to refill per protocol, courtesy refill already given, routing for provider approval.      Requested Prescriptions  Pending Prescriptions Disp Refills   linaclotide (LINZESS) 145 MCG CAPS capsule 30 capsule 0    Sig: TAKE 1 CAPSULE BY MOUTH EVERY [place]     Gastroenterology: Irritable Bowel Syndrome Failed - 12/13/2022  5:31 PM      Failed - Valid encounter within last 12 months    Recent Outpatient Visits           11 months ago Metatarsalgia of left foot   [place] Pickard, Cammie Mcgee, MD   1 year ago Bursitis of right foot   [place] Pickard, Cammie Mcgee, MD   1 year ago Blepharitis of left lower eyelid, unspecified type   [place] Pickard, Cammie Mcgee, MD   1 year ago Blepharitis of left lower eyelid, unspecified type   [place] Eulogio Bear, NP   1 year ago Viral gastroenteritis   [place] Pickard, Cammie Mcgee, MD       Future Appointments             In 11 months Ralene Bathe, MD [place]

## 2022-12-19 NOTE — Telephone Encounter (Signed)
Med refill appointment scheduled for Friday 01/05/23.

## 2022-12-25 ENCOUNTER — Other Ambulatory Visit: Payer: Self-pay

## 2022-12-25 MED ORDER — LINACLOTIDE 145 MCG PO CAPS: ORAL_CAPSULE | ORAL | 0 refills | Status: DC

## 2023-01-04 ENCOUNTER — Ambulatory Visit: Payer: BC Managed Care – PPO | Admitting: Dermatology

## 2023-01-05 ENCOUNTER — Ambulatory Visit: Payer: BC Managed Care – PPO | Admitting: Family Medicine

## 2023-02-22 ENCOUNTER — Other Ambulatory Visit: Payer: Self-pay | Admitting: Family Medicine

## 2023-02-22 NOTE — Telephone Encounter (Signed)
Requested medication (s) are due for refill today -yes  Requested medication (s) are on the active medication list -yes  Future visit scheduled -no  Last refill: 11/20/22 #90  Notes to clinic: non delegated Rx  Requested Prescriptions  Pending Prescriptions Disp Refills   ALPRAZolam (XANAX) 0.5 MG tablet [Pharmacy Med Name: ALPRAZOLAM 0.5 MG TABLET] 90 tablet 0    Sig: TAKE 1 TABLET BY MOUTH UP TO 3 TIMES DAILY AS NEEDED FOR ANXIETY.     Not Delegated - Psychiatry: Anxiolytics/Hypnotics 2 Failed - 02/22/2023  4:03 PM      Failed - This refill cannot be delegated      Failed - Urine Drug Screen completed in last 360 days      Failed - Valid encounter within last 6 months    Recent Outpatient Visits           1 year ago Metatarsalgia of left foot   [facility] Pickard, Priscille Heidelberg, MD   1 year ago Bursitis of right foot   [facility] Pickard, Priscille Heidelberg, MD   1 year ago Blepharitis of left lower eyelid, unspecified type   [facility] Pickard, Priscille Heidelberg, MD   1 year ago Blepharitis of left lower eyelid, unspecified type   [facility] Valentino Nose, NP   1 year ago Viral gastroenteritis   [facility] Pickard, Priscille Heidelberg, MD       Future Appointments             In 9 months Deirdre Evener, MD [facility]            Passed - Patient is not pregnant         Requested Prescriptions  Pending Prescriptions Disp Refills   ALPRAZolam (XANAX) 0.5 MG tablet [Pharmacy Med Name: ALPRAZOLAM 0.5 MG TABLET] 90 tablet 0    Sig: TAKE 1 TABLET BY MOUTH UP TO 3 TIMES DAILY AS NEEDED FOR ANXIETY.     Not Delegated - Psychiatry: Anxiolytics/Hypnotics 2 Failed - 02/22/2023  4:03 PM      Failed - This refill cannot be delegated      Failed - Urine Drug Screen completed in last 360 days      Failed - Valid encounter within last 6 months    Recent Outpatient Visits            1 year ago Metatarsalgia of left foot   [facility] Pickard, Priscille Heidelberg, MD   1 year ago Bursitis of right foot   [facility] Pickard, Priscille Heidelberg, MD   1 year ago Blepharitis of left lower eyelid, unspecified type   [facility] Pickard, Priscille Heidelberg, MD   1 year ago Blepharitis of left lower eyelid, unspecified type   [facility] Valentino Nose, NP   1 year ago Viral gastroenteritis   [facility] Pickard, Priscille Heidelberg, MD       Future Appointments             In 9 months Deirdre Evener, MD [facility]            Passed - Patient is not pregnant

## 2023-03-13 ENCOUNTER — Encounter: Payer: Self-pay | Admitting: Family Medicine

## 2023-03-13 ENCOUNTER — Ambulatory Visit: Payer: BC Managed Care – PPO | Admitting: Family Medicine

## 2023-03-13 VITALS — BP 132/82 | HR 81 | Temp 97.7°F | Ht 71.0 in | Wt 297.0 lb

## 2023-03-13 DIAGNOSIS — E669 Obesity, unspecified: Secondary | ICD-10-CM | POA: Diagnosis not present

## 2023-03-13 DIAGNOSIS — Z713 Dietary counseling and surveillance: Secondary | ICD-10-CM

## 2023-03-13 MED ORDER — LINACLOTIDE 145 MCG PO CAPS: 145.0000 ug | ORAL_CAPSULE | Freq: Every day | ORAL | 3 refills | Status: AC

## 2023-03-13 NOTE — Progress Notes (Signed)
Subjective:    Patient ID: Darren Baker, male    DOB: Dec 17, 1970, 52 y.o.   MRN: [id number]  HPI' Patient is requesting assistance with weight loss.  His weight is now over 290 pounds.  In the past he has tried and failed orlistat.  He is exercising but he is not capable of losing weight.  He is interested in [place].  He reports that he had some bleeding from his umbilicus last week however thorough evaluation of his umbilicus today reveals no atypical lesions inside the umbilicus.  There is no evidence of any bleeding or infection.  He does have an atypical area inside his right helix.  It is erythematous and scaly.  He saw his dermatologist who treated with liquid nitrogen cryotherapy.  He continues to pick at that area Past Medical History:  Diagnosis Date   DDD (degenerative disc disease), lumbar    Dysplastic nevus 02/24/2016   L chest - mild    Dysplastic nevus 08/11/2021   Left xyphoid - moderate   Hypertension    Low back pain    Migraine    Obesity    Post concussion syndrome    Current Outpatient Medications on File Prior to Visit  Medication Sig Dispense Refill   acetaminophen (TYLENOL) 500 MG tablet Take 1,000 mg by mouth every 6 (six) hours as needed for moderate pain.     ALPRAZolam (XANAX) 0.5 MG tablet TAKE 1 TABLET BY MOUTH UP TO 3 TIMES DAILY AS NEEDED FOR ANXIETY. 90 tablet 0   eletriptan (RELPAX) 40 MG tablet Take 40 mg by mouth daily as needed for migraine. One tablet by mouth at onset of headache. May repeat in 2 hours if headache persists or recurs.     fluocinonide (LIDEX) 0.05 % external solution Apply 1 application topically 2 (two) times daily as needed. (Patient not taking: Reported on 08/11/2022) 180 mL 3   HYDROcodone-acetaminophen (NORCO/VICODIN) 5-325 MG tablet Take 1 tablet by mouth every 6 (six) hours as needed for moderate pain. 90 tablet 0   hydroxypropyl methylcellulose / hypromellose (ISOPTO TEARS / GONIOVISC) 2.5 % ophthalmic solution Place 1  drop into both eyes daily as needed for dry eyes. (Patient not taking: Reported on 08/11/2022)     linaclotide (LINZESS) 145 MCG CAPS capsule TAKE 1 CAPSULE BY MOUTH EVERY DAY BEFORE BREAKFAST 30 capsule 0   losartan-hydrochlorothiazide (HYZAAR) 50-12.5 MG tablet Take 1 tablet by mouth daily. 90 tablet 3   magnesium oxide (MAG-OX) 400 (241.3 MG) MG tablet Take 400-800 mg by mouth daily.     meclizine (ANTIVERT) 25 MG tablet Take 1 tablet (25 mg total) by mouth 3 (three) times daily as needed for dizziness. (Patient not taking: Reported on 08/11/2022) 30 tablet 0   meloxicam (MOBIC) 7.5 MG tablet Take 1 tablet (7.5 mg total) by mouth daily. (Patient taking differently: Take 7.5-15 mg by mouth daily.) 30 tablet 5   methocarbamol (ROBAXIN) 500 MG tablet Take 500 mg by mouth every 8 (eight) hours as needed for muscle spasms.     mometasone (ELOCON) 0.1 % cream Apply 1 application topically as directed. Qd up to 5 days a week aa rash in buttocks crease 50 g 1   neomycin-polymyxin-hydrocortisone (CORTISPORIN) OTIC solution Place 3 drops into the left ear 4 (four) times daily. 10 mL 0   predniSONE (DELTASONE) 20 MG tablet 3 tabs poqday 1-2, 2 tabs poqday 3-4, 1 tab poqday 5-6 12 tablet 0   Roflumilast (ZORYVE) 0.3 % CREA  Apply 1 application  topically daily. 60 g 6   sildenafil (VIAGRA) 100 MG tablet Take 0.5-1 tablets (50-100 mg total) by mouth daily as needed for erectile dysfunction. (Patient not taking: Reported on 08/11/2022) 5 tablet 11   sulfacetamide (BLEPH-10) 10 % ophthalmic solution      tiZANidine (ZANAFLEX) 4 MG tablet Take 4-8 mg by mouth at bedtime as needed.     triamcinolone (KENALOG) 0.147 MG/GM topical spray Apply topically as directed. Qd up to 5 days a week to aa psoriasis in scalp, avoid face, groin, axilla 63 g 3   UBRELVY 100 MG TABS Take 100 mg by mouth daily as needed (migraine).     valACYclovir (VALTREX) 1000 MG tablet Take 1 tablet (1,000 mg total) by mouth as directed. Take 2  po with first symptoms of fever blister then 2 po 12 hours later (Patient not taking: Reported on 08/11/2022) 30 tablet 11   verapamil (CALAN-SR) 240 MG CR tablet Take 240 mg by mouth daily.  3   No current facility-administered medications on file prior to visit.   No Known Allergies Social History   Socioeconomic History   Marital status: Married    Spouse name: Not on file   Number of children: Not on file   Years of education: Not on file   Highest education level: Not on file  Occupational History   Not on file  Tobacco Use   Smoking status: Never   Smokeless tobacco: Never  Substance and Sexual Activity   Alcohol use: Yes    Comment: Rare   Drug use: No   Sexual activity: Not on file    Comment: Married, 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  Other Topics Concern   Not on file  Social History Narrative   Not on file   Social Determinants of Health   Financial Resource Strain: Not on file  Food Insecurity: Not on file  Transportation Needs: Not on file  Physical Activity: Not on file  Stress: Not on file  Social Connections: Not on file  Intimate Partner Violence: Not on file   Family History  Problem Relation Age of Onset   Diabetes Father    Heart disease Father    Hyperlipidemia Father    Hypertension Father      Review of Systems  All other systems reviewed and are negative.      Objective:   Physical Exam Vitals reviewed.  Constitutional:      General: He is not in acute distress.    Appearance: He is well-developed. He is not diaphoretic.  HENT:     Head: Normocephalic and atraumatic.  Neck:     Thyroid: No thyromegaly.     Vascular: No JVD.     Trachea: No tracheal deviation.  Cardiovascular:     Rate and Rhythm: Normal rate and regular rhythm.     Heart sounds: Normal heart sounds. No murmur heard.    No friction rub. No gallop.  Pulmonary:     Effort: Pulmonary effort is normal. No respiratory distress.     Breath sounds: Normal breath sounds.  No stridor. No wheezing or rales.  Chest:     Chest wall: No tenderness.  Abdominal:     General: Bowel sounds are normal.     Palpations: Abdomen is soft.     Tenderness: There is no guarding or rebound.  Genitourinary:    Penis: Normal.      Testes: Normal.  Musculoskeletal:     Cervical  back: Normal range of motion and neck supple.  Lymphadenopathy:     Cervical: No cervical adenopathy.  Skin:    Findings: No rash.  Neurological:     Mental Status: He is alert.     Motor: No abnormal muscle tone.          Assessment & Plan:  Obesity, unspecified classification, unspecified obesity type, unspecified whether serious comorbidity present Because of his blood pressure I do not feel comfortable with patient taking Adipex.  I recommended trying Wegovy through her local compounding pharmacy.  I believe that this would be his best option.  There is no visible abnormality in his umbilicus.  I am concerned that may be a pickers nodule versus a precancerous lesion in the right helix.  I recommended that he stop manipulating and scratching the area in his ear for 2 weeks.  If it is not healing and resolving I would recommend a biopsy.

## 2023-04-11 ENCOUNTER — Other Ambulatory Visit: Payer: BC Managed Care – PPO

## 2023-04-11 DIAGNOSIS — I10 Essential (primary) hypertension: Secondary | ICD-10-CM

## 2023-04-11 DIAGNOSIS — Z13220 Encounter for screening for lipoid disorders: Secondary | ICD-10-CM

## 2023-04-11 DIAGNOSIS — E669 Obesity, unspecified: Secondary | ICD-10-CM

## 2023-04-12 LAB — CBC WITH DIFFERENTIAL/PLATELET
Absolute Monocytes: 626 cells/uL (ref 200–950)
Basophils Absolute: 50 cells/uL (ref 0–200)
Basophils Relative: 0.8 %
Eosinophils Absolute: 260 cells/uL (ref 15–500)
Eosinophils Relative: 4.2 %
HCT: 40.7 % (ref 38.5–50.0)
Hemoglobin: 13.8 g/dL (ref 13.2–17.1)
Lymphs Abs: 1643 cells/uL (ref 850–3900)
MCH: 29.6 pg (ref 27.0–33.0)
MCHC: 33.9 g/dL (ref 32.0–36.0)
MCV: 87.2 fL (ref 80.0–100.0)
MPV: 11.3 fL (ref 7.5–12.5)
Monocytes Relative: 10.1 %
Neutro Abs: 3621 cells/uL (ref 1500–7800)
Neutrophils Relative %: 58.4 %
Platelets: 264 10*3/uL (ref 140–400)
RBC: 4.67 10*6/uL (ref 4.20–5.80)
RDW: 13.1 % (ref 11.0–15.0)
Total Lymphocyte: 26.5 %
WBC: 6.2 10*3/uL (ref 3.8–10.8)

## 2023-04-12 LAB — COMPLETE METABOLIC PANEL WITH GFR
AG Ratio: 2.1 (calc) (ref 1.0–2.5)
ALT: 14 U/L (ref 9–46)
AST: 15 U/L (ref 10–35)
Albumin: 4.4 g/dL (ref 3.6–5.1)
Alkaline phosphatase (APISO): 74 U/L (ref 35–144)
BUN: 19 mg/dL (ref 7–25)
CO2: 24 mmol/L (ref 20–32)
Calcium: 9.5 mg/dL (ref 8.6–10.3)
Chloride: 104 mmol/L (ref 98–110)
Creat: 1.23 mg/dL (ref 0.70–1.30)
Globulin: 2.1 g/dL (calc) (ref 1.9–3.7)
Glucose, Bld: 98 mg/dL (ref 65–99)
Potassium: 4 mmol/L (ref 3.5–5.3)
Sodium: 137 mmol/L (ref 135–146)
Total Bilirubin: 0.4 mg/dL (ref 0.2–1.2)
Total Protein: 6.5 g/dL (ref 6.1–8.1)
eGFR: 70 mL/min/{1.73_m2} (ref >=60)

## 2023-04-12 LAB — LIPID PANEL
Cholesterol: 147 mg/dL (ref <200)
HDL: 42 mg/dL (ref >=40)
LDL Cholesterol (Calc): 77 mg/dL (calc)
Non-HDL Cholesterol (Calc): 105 mg/dL (calc) (ref <130)
Total CHOL/HDL Ratio: 3.5 (calc) (ref <5.0)
Triglycerides: 189 mg/dL — ABNORMAL HIGH (ref <150)

## 2023-04-19 ENCOUNTER — Ambulatory Visit (INDEPENDENT_AMBULATORY_CARE_PROVIDER_SITE_OTHER): Payer: BC Managed Care – PPO | Admitting: Family Medicine

## 2023-04-19 VITALS — BP 124/74 | HR 70 | Temp 97.8°F | Ht 71.0 in | Wt 283.4 lb

## 2023-04-19 DIAGNOSIS — Z23 Encounter for immunization: Secondary | ICD-10-CM

## 2023-04-19 DIAGNOSIS — Z0000 Encounter for general adult medical examination without abnormal findings: Secondary | ICD-10-CM

## 2023-04-19 DIAGNOSIS — I10 Essential (primary) hypertension: Secondary | ICD-10-CM | POA: Diagnosis not present

## 2023-04-19 DIAGNOSIS — G8929 Other chronic pain: Secondary | ICD-10-CM

## 2023-04-19 DIAGNOSIS — Z0001 Encounter for general adult medical examination with abnormal findings: Secondary | ICD-10-CM

## 2023-04-19 DIAGNOSIS — M5441 Lumbago with sciatica, right side: Secondary | ICD-10-CM

## 2023-04-19 NOTE — Progress Notes (Signed)
Subjective:    Patient ID: Darren Baker, male    DOB: 28-May-1971, 52 y.o.   MRN: [id number]  HPI Patient is here today for a complete physical exam.  Blood pressure is excellent at 124/74.  He is still dealing with a lot of chronic low back pain stemming from degenerative disc disease.  The pain radiates into his right hip constantly.  Patient deals with migraines.  His neurologist recommended Zoloft 50 mg a day because the patient's stress and anxiety level contributes dramatically to his migraines and overall quality of life.  He has been taking Zoloft for 4 weeks but has not seen any improvement.  I explained to the patient that it usually takes 4 to 6 weeks to begin to take effect.  However I would recommend increasing the dose to 100 mg.  He prefers to discuss this with the neurologist.  Patient had Cologuard testing in 2023.  He is due for his next PSA in November 24.  He is due for the shingles vaccine.  However he has a lot of anxiety.  He also suffers from OCD.  Today we spent 10 minutes discussing this and I have strongly recommended seeing a psychiatrist/psychologist.  Patient is hesitant to do this. Lab on 04/11/2023  Component Date Value Ref Range Status   WBC 04/11/2023 6.2  3.8 - 10.8 Thousand/uL Final   RBC 04/11/2023 4.67  4.20 - 5.80 Million/uL Final   Hemoglobin 04/11/2023 13.8  13.2 - 17.1 g/dL Final   HCT 21/30/8657 40.7  38.5 - 50.0 % Final   MCV 04/11/2023 87.2  80.0 - 100.0 fL Final   MCH 04/11/2023 29.6  27.0 - 33.0 pg Final   MCHC 04/11/2023 33.9  32.0 - 36.0 g/dL Final   RDW 84/69/6295 13.1  11.0 - 15.0 % Final   Platelets 04/11/2023 264  140 - 400 Thousand/uL Final   MPV 04/11/2023 11.3  7.5 - 12.5 fL Final   Neutro Abs 04/11/2023 3,621  1,500 - 7,800 cells/uL Final   Lymphs Abs 04/11/2023 1,643  850 - 3,900 cells/uL Final   Absolute Monocytes 04/11/2023 626  200 - 950 cells/uL Final   Eosinophils Absolute 04/11/2023 260  15 - 500 cells/uL Final   Basophils  Absolute 04/11/2023 50  0 - 200 cells/uL Final   Neutrophils Relative % 04/11/2023 58.4  % Final   Total Lymphocyte 04/11/2023 26.5  % Final   Monocytes Relative 04/11/2023 10.1  % Final   Eosinophils Relative 04/11/2023 4.2  % Final   Basophils Relative 04/11/2023 0.8  % Final   Glucose, Bld 04/11/2023 98  65 - 99 mg/dL Final   Comment: .            Fasting reference interval .    BUN 04/11/2023 19  7 - 25 mg/dL Final   Creat 28/41/3244 1.23  0.70 - 1.30 mg/dL Final   eGFR 10/04/7251 70  > OR = 60 mL/min/1.80m2 Final   BUN/Creatinine Ratio 04/11/2023 SEE NOTE:  6 - 22 (calc) Final   Comment:    Not Reported: BUN and Creatinine are within    reference range. .    Sodium 04/11/2023 137  135 - 146 mmol/L Final   Potassium 04/11/2023 4.0  3.5 - 5.3 mmol/L Final   Chloride 04/11/2023 104  98 - 110 mmol/L Final   CO2 04/11/2023 24  20 - 32 mmol/L Final   Calcium 04/11/2023 9.5  8.6 - 10.3 mg/dL Final   Total Protein  04/11/2023 6.5  6.1 - 8.1 g/dL Final   Albumin 96/29/5284 4.4  3.6 - 5.1 g/dL Final   Globulin 13/24/4010 2.1  1.9 - 3.7 g/dL (calc) Final   AG Ratio 04/11/2023 2.1  1.0 - 2.5 (calc) Final   Total Bilirubin 04/11/2023 0.4  0.2 - 1.2 mg/dL Final   Alkaline phosphatase (APISO) 04/11/2023 74  35 - 144 U/L Final   AST 04/11/2023 15  10 - 35 U/L Final   ALT 04/11/2023 14  9 - 46 U/L Final   Cholesterol 04/11/2023 147  <200 mg/dL Final   HDL 27/25/3664 42  > OR = 40 mg/dL Final   Triglycerides 40/34/7425 189 (H)  <150 mg/dL Final   LDL Cholesterol (Calc) 04/11/2023 77  mg/dL (calc) Final   Comment: Reference range: <100 . Desirable range <100 mg/dL for primary prevention;   <70 mg/dL for patients with CHD or diabetic patients  with > or = 2 CHD risk factors. Marland Kitchen LDL-C is now calculated using the Martin-Hopkins  calculation, which is a validated novel method providing  better accuracy than the Friedewald equation in the  estimation of LDL-C.  Horald Pollen et al. Lenox Ahr.  9563;875(64): 2061-2068  (http://education.[employer].com/faq/FAQ164)    Total CHOL/HDL Ratio 04/11/2023 3.5  <3.3 (calc) Final   Non-HDL Cholesterol (Calc) 04/11/2023 105  <130 mg/dL (calc) Final   Comment: For patients with diabetes plus 1 major ASCVD risk  factor, treating to a non-HDL-C goal of <100 mg/dL  (LDL-C of <29 mg/dL) is considered a therapeutic  option.     Past Medical History:  Diagnosis Date   DDD (degenerative disc disease), lumbar    Dysplastic nevus 02/24/2016   L chest - mild    Dysplastic nevus 08/11/2021   Left xyphoid - moderate   Hypertension    Low back pain    Migraine    Obesity    Post concussion syndrome    Current Outpatient Medications on File Prior to Visit  Medication Sig Dispense Refill   acetaminophen (TYLENOL) 500 MG tablet Take 1,000 mg by mouth every 6 (six) hours as needed for moderate pain.     ALPRAZolam (XANAX) 0.5 MG tablet TAKE 1 TABLET BY MOUTH UP TO 3 TIMES DAILY AS NEEDED FOR ANXIETY. 90 tablet 0   eletriptan (RELPAX) 40 MG tablet Take 40 mg by mouth daily as needed for migraine. One tablet by mouth at onset of headache. May repeat in 2 hours if headache persists or recurs.     HYDROcodone-acetaminophen (NORCO/VICODIN) 5-325 MG tablet Take 1 tablet by mouth every 6 (six) hours as needed for moderate pain. 90 tablet 0   linaclotide (LINZESS) 145 MCG CAPS capsule Take 1 capsule (145 mcg total) by mouth daily before breakfast. 90 capsule 3   losartan-hydrochlorothiazide (HYZAAR) 50-12.5 MG tablet Take 1 tablet by mouth daily. 90 tablet 3   magnesium oxide (MAG-OX) 400 (241.3 MG) MG tablet Take 400-800 mg by mouth daily.     meloxicam (MOBIC) 7.5 MG tablet Take 1 tablet (7.5 mg total) by mouth daily. (Patient taking differently: Take 7.5-15 mg by mouth daily.) 30 tablet 5   Roflumilast (ZORYVE) 0.3 % CREA Apply 1 application  topically daily. 60 g 6   sertraline (ZOLOFT) 50 MG tablet Take 50 mg by mouth daily.     sildenafil  (VIAGRA) 100 MG tablet Take 0.5-1 tablets (50-100 mg total) by mouth daily as needed for erectile dysfunction. 5 tablet 11   tiZANidine (ZANAFLEX) 4 MG tablet Take  4-8 mg by mouth at bedtime as needed.     valACYclovir (VALTREX) 1000 MG tablet Take 1 tablet (1,000 mg total) by mouth as directed. Take 2 po with first symptoms of fever blister then 2 po 12 hours later 30 tablet 11   verapamil (CALAN-SR) 240 MG CR tablet Take 240 mg by mouth daily.  3   No current facility-administered medications on file prior to visit.   No Known Allergies Social History   Socioeconomic History   Marital status: Married    Spouse name: Not on file   Number of children: Not on file   Years of education: Not on file   Highest education level: Not on file  Occupational History   Not on file  Tobacco Use   Smoking status: Never   Smokeless tobacco: Never  Substance and Sexual Activity   Alcohol use: Yes    Comment: Rare   Drug use: No   Sexual activity: Not on file    Comment: Married, 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  Other Topics Concern   Not on file  Social History Narrative   Not on file   Social Determinants of Health   Financial Resource Strain: Low Risk  (02/21/2023)   Received from [facility]   Overall Financial Resource Strain (CARDIA)    Difficulty of Paying Living Expenses: Not very hard  Food Insecurity: No Food Insecurity (02/21/2023)   Received from [facility]   Hunger Vital Sign    Worried About Running Out of Food in the Last Year: Never true    Ran Out of Food in the Last Year: Never true  Transportation Needs: No Transportation Needs (02/21/2023)   Received from [facility]   PRAPARE - Transportation    Lack of Transportation (Medical): No    Lack of Transportation (Non-Medical): No  Physical Activity: Unknown (10/22/2022)   Received from [facility]   Exercise Vital Sign    Days of Exercise per Week: Patient  declined    Minutes of Exercise per Session: Not on file  Stress: Patient Declined (10/22/2022)   Received from [facility] - Occupational Stress Questionnaire    Feeling of Stress : Patient declined  Social Connections: Unknown (02/02/2022)   Received from [facility], [facility]   Social Network    Social Network: Not on file  Intimate Partner Violence: Not At Risk (10/22/2022)   Received from [facility]   HITS    Over the last 12 months how often did your partner physically hurt you?: 1    Over the last 12 months how often did your partner insult you or talk down to you?: 1    Over the last 12 months how often did your partner threaten you with physical harm?: 1    Over the last 12 months how often did your partner scream or curse at you?: 1   Family History  Problem Relation Age of Onset   Diabetes Father    Heart disease Father    Hyperlipidemia Father    Hypertension Father      Review of Systems  All other systems reviewed and are negative.      Objective:   Physical Exam Vitals reviewed.  Constitutional:      General: He is not in acute distress.    Appearance: He is well-developed. He is obese. He is not diaphoretic.  HENT:     Head: Normocephalic and atraumatic.     Right Ear: Tympanic membrane, ear canal and external ear normal.     Left Ear: Tympanic membrane, ear canal and external ear normal.     Nose: Nose normal. No congestion or rhinorrhea.     Mouth/Throat:     Pharynx: No oropharyngeal exudate or posterior oropharyngeal erythema.  Eyes:     General: No scleral icterus.       Right eye: No discharge.        Left eye: No discharge.     Conjunctiva/sclera: Conjunctivae normal.     Pupils: Pupils are equal, round, and reactive to light.  Neck:     Thyroid: No thyromegaly.     Vascular: No carotid bruit or JVD.     Trachea: No tracheal deviation.  Cardiovascular:      Rate and Rhythm: Normal rate and regular rhythm.     Heart sounds: Normal heart sounds. No murmur heard.    No friction rub. No gallop.  Pulmonary:     Effort: Pulmonary effort is normal. No respiratory distress.     Breath sounds: Normal breath sounds. No stridor. No wheezing or rales.  Chest:     Chest wall: No tenderness.  Abdominal:     General: Bowel sounds are normal. There is no distension.     Palpations: Abdomen is soft. There is no mass.     Tenderness: There is no abdominal tenderness. There is no guarding or rebound.     Hernia: No hernia is present.  Genitourinary:    Penis: Normal.      Rectum: Normal.  Musculoskeletal:        General: No tenderness or deformity.     Cervical back: Normal range of motion and neck supple. No rigidity.     Right lower leg: No edema.     Left lower leg: No edema.  Lymphadenopathy:     Cervical: No cervical adenopathy.  Skin:    General: Skin is warm.     Coloration: Skin is not pale.     Findings: No rash.  Neurological:     Mental Status: He is alert and oriented to person, place, and time.     Cranial Nerves: No cranial nerve deficit.     Sensory: No sensory deficit.     Motor: No weakness or abnormal muscle tone.     Coordination: Coordination normal.     Gait: Gait normal.     Deep Tendon Reflexes: Reflexes normal.  Psychiatric:        Mood and Affect: Mood is anxious.        Behavior: Behavior normal.        Cognition and Memory: Cognition and memory normal.           Assessment & Plan:  Need for shingles vaccine - Plan: Varicella-zoster vaccine IM  General medical exam  Benign essential HTN  Chronic midline low back pain with right-sided sciatica Ever since his retirement from the [employer], the patient's anxiety seems to worsen.  He deals with.  I strongly recommended talking to a therapist/psychologist or meeting with a psychiatrist.  I gave him contact information for several options.  His blood pressure  today is well-controlled.  His lab work is excellent.  Colon cancer screening is up.  Recommended a PSA after November of this year.  Patient received the shingles vaccine today.  He deferred a referral to a psychiatrist at the  present time

## 2023-05-02 ENCOUNTER — Ambulatory Visit: Payer: BC Managed Care – PPO | Admitting: Dermatology

## 2023-05-07 ENCOUNTER — Ambulatory Visit: Payer: BC Managed Care – PPO | Admitting: Dermatology

## 2023-05-29 ENCOUNTER — Other Ambulatory Visit: Payer: Self-pay | Admitting: Family Medicine

## 2023-05-30 NOTE — Telephone Encounter (Signed)
Requested medication (s) are due for refill today: yes  Requested medication (s) are on the active medication list: yes  Last refill:  02/23/23  Future visit scheduled: no  Notes to clinic:  Unable to refill per protocol, cannot delegate.      Requested Prescriptions  Pending Prescriptions Disp Refills   ALPRAZolam (XANAX) 0.5 MG tablet [Pharmacy Med Name: ALPRAZOLAM 0.5 MG TABLET] 90 tablet 0    Sig: TAKE 1 TABLET BY MOUTH UP TO 3 TIMES DAILY AS NEEDED FOR ANXIETY.     Not Delegated - Psychiatry: Anxiolytics/Hypnotics 2 Failed - 05/29/2023 10:47 AM      Failed - This refill cannot be delegated      Failed - Urine Drug Screen completed in last 360 days      Failed - Valid encounter within last 6 months    Recent Outpatient Visits           1 year ago Metatarsalgia of left foot   [facility] Pickard, Priscille Heidelberg, MD   1 year ago Bursitis of right foot   [facility] Pickard, Priscille Heidelberg, MD   1 year ago Blepharitis of left lower eyelid, unspecified type   [facility] Pickard, Priscille Heidelberg, MD   1 year ago Blepharitis of left lower eyelid, unspecified type   [facility] Valentino Nose, NP   2 years ago Viral gastroenteritis   [facility] Pickard, Priscille Heidelberg, MD       Future Appointments             In 6 months Deirdre Evener, MD [facility]            Passed - Patient is not pregnant

## 2023-06-01 ENCOUNTER — Other Ambulatory Visit: Payer: Self-pay | Admitting: Family Medicine

## 2023-06-01 MED ORDER — ALPRAZOLAM 0.5 MG PO TABS: 0.5000 mg | ORAL_TABLET | Freq: Three times a day (TID) | ORAL | 0 refills | Status: DC | PRN

## 2023-06-01 NOTE — Telephone Encounter (Signed)
Patient called to follow up on refill reqeusted for   ALPRAZolam Prudy Feeler) 0.5 MG tablet   Patient stated he spoke with they pharmacy and they're awaiting approval from [place].  LOV: 04/19/23 (CPD)  Please advise at [phone].

## 2023-08-09 ENCOUNTER — Ambulatory Visit: Payer: Self-pay | Admitting: Family Medicine

## 2023-08-09 ENCOUNTER — Other Ambulatory Visit: Payer: Self-pay | Admitting: Family Medicine

## 2023-08-09 MED ORDER — AMOXICILLIN-POT CLAVULANATE 875-125 MG PO TABS: 1.0000 | ORAL_TABLET | Freq: Two times a day (BID) | ORAL | 0 refills | Status: DC

## 2023-08-09 NOTE — Telephone Encounter (Signed)
   Chief Complaint: abdominal pain Symptoms: abdominal pain Frequency: consant Pertinent Negatives: Patient denies fever, diarrhea, vomiting,  Disposition: [] ED /[] Urgent Care (no appt availability in office) / [x] Appointment(In office/virtual)/ []  [facility]/ [] Home Care/ [] Refused Recommended Disposition /[]  Mobile Bus/ []  Follow-up with PCP Additional Notes: Patient calls in reporting abdominal pain that is similar to what he experienced last year when he was diagnosed with diverticulits. Patient denies any other concerns. Patient was booked in for next available appointment 11/12.   Copied from CRM [id number]. Topic: Clinical - Red Word Triage >> Aug 09, 2023  2:27 PM Sasha H wrote: Red Word that prompted transfer to Nurse Triage: PT called in complaining of lower left side pain that he states may be his appendicitis back.  Reason for Disposition  Abdominal pain is a chronic symptom (recurrent or ongoing AND present > 4 weeks)  Answer Assessment - Initial Assessment Questions 1. LOCATION: "Where does it hurt?"      Lower left 2. RADIATION: "Does the pain shoot anywhere else?" (e.g., chest, back)     Upper abdomen 3. ONSET: "When did the pain begin?" (Minutes, hours or days ago)      1 day ago 4. SUDDEN: "Gradual or sudden onset?"     sudden 5. PATTERN "Does the pain come and go, or is it constant?"    - If it comes and goes: "How long does it last?" "Do you have pain now?"     (Note: Comes and goes means the pain is intermittent. It goes away completely between bouts.)    - If constant: "Is it getting better, staying the same, or getting worse?"      (Note: Constant means the pain never goes away completely; most serious pain is constant and gets worse.)      Constant when standing 6. SEVERITY: "How bad is the pain?"  (e.g., Scale 1-10; mild, moderate, or severe)    - MILD (1-3): Doesn't interfere with normal activities, abdomen soft and not tender to touch.      - MODERATE (4-7): Interferes with normal activities or awakens from sleep, abdomen tender to touch.     - SEVERE (8-10): Excruciating pain, doubled over, unable to do any normal activities.       moderate 7. RECURRENT SYMPTOM: "Have you ever had this type of stomach pain before?" If Yes, ask: "When was the last time?" and "What happened that time?"      Yes, 1 year ago but this is worse. I needed antibiotics last time 8. CAUSE: "What do you think is causing the stomach pain?"     I had GI problems diagnosed 1 year ago and it feels like that again 9. RELIEVING/AGGRAVATING FACTORS: "What makes it better or worse?" (e.g., antacids, bending or twisting motion, bowel movement)     Moving around makes it worse 10. OTHER SYMPTOMS: "Do you have any other symptoms?" (e.g., back pain, diarrhea, fever, urination pain, vomiting)     no  Protocols used: Abdominal Pain - Male-A-AH

## 2023-08-10 ENCOUNTER — Other Ambulatory Visit: Payer: Self-pay | Admitting: Family Medicine

## 2023-08-10 MED ORDER — METRONIDAZOLE 500 MG PO TABS: 500.0000 mg | ORAL_TABLET | Freq: Two times a day (BID) | ORAL | 0 refills | Status: AC

## 2023-08-10 MED ORDER — CIPROFLOXACIN HCL 500 MG PO TABS: 500.0000 mg | ORAL_TABLET | Freq: Two times a day (BID) | ORAL | 0 refills | Status: AC

## 2023-08-10 NOTE — Telephone Encounter (Signed)
Pt states he is allergic to Augmentin. Pt states he had severe stomach issues with it. Pt asks if another abx can be sent in ? Thanks.

## 2023-08-14 ENCOUNTER — Ambulatory Visit: Payer: BC Managed Care – PPO | Admitting: Family Medicine

## 2023-08-14 ENCOUNTER — Encounter: Payer: Self-pay | Admitting: Family Medicine

## 2023-08-14 VITALS — BP 138/86 | HR 82 | Temp 98.7°F | Ht 71.0 in | Wt 288.0 lb

## 2023-08-14 DIAGNOSIS — K5792 Diverticulitis of intestine, part unspecified, without perforation or abscess without bleeding: Secondary | ICD-10-CM

## 2023-08-14 DIAGNOSIS — Z23 Encounter for immunization: Secondary | ICD-10-CM

## 2023-08-14 NOTE — Progress Notes (Signed)
Subjective:    Patient ID: Darren Baker, male    DOB: [birth year], 52 y.o.   MRN: [id number]  Abdominal Pain   Patient has a history of diverticulitis.  He was diagnosed with a CAT scan last year.  Last week he called with left lower quadrant abdominal pain.  I empirically started the patient on Cipro and Flagyl.  He is here today for follow-up.  The pain in his left lower quadrant has improved dramatically.  Is essentially gone.  He denies any nausea or vomiting.  He denies any melena.  He wants to wean off Zoloft because he has not seen any benefit with regards to depression or OCD. Past Medical History:  Diagnosis Date   DDD (degenerative disc disease), lumbar    Dysplastic nevus 02/24/2016   L chest - mild    Dysplastic nevus 08/11/2021   Left xyphoid - moderate   Hypertension    Low back pain    Migraine    Obesity    Post concussion syndrome    Current Outpatient Medications on File Prior to Visit  Medication Sig Dispense Refill   acetaminophen (TYLENOL) 500 MG tablet Take 1,000 mg by mouth every 6 (six) hours as needed for moderate pain.     ALPRAZolam (XANAX) 0.5 MG tablet Take 1 tablet (0.5 mg total) by mouth 3 (three) times daily as needed for anxiety. 90 tablet 0   ciprofloxacin (CIPRO) 500 MG tablet Take 1 tablet (500 mg total) by mouth 2 (two) times daily for 10 days. 20 tablet 0   eletriptan (RELPAX) 40 MG tablet Take 40 mg by mouth daily as needed for migraine. One tablet by mouth at onset of headache. May repeat in 2 hours if headache persists or recurs.     HYDROcodone-acetaminophen (NORCO/VICODIN) 5-325 MG tablet Take 1 tablet by mouth every 6 (six) hours as needed for moderate pain. 90 tablet 0   linaclotide (LINZESS) 145 MCG CAPS capsule Take 1 capsule (145 mcg total) by mouth daily before breakfast. 90 capsule 3   losartan-hydrochlorothiazide (HYZAAR) 50-12.5 MG tablet Take 1 tablet by mouth daily. 90 tablet 3   magnesium oxide (MAG-OX) 400 (241.3 MG) MG  tablet Take 400-800 mg by mouth daily.     meloxicam (MOBIC) 7.5 MG tablet Take 1 tablet (7.5 mg total) by mouth daily. (Patient taking differently: Take 7.5-15 mg by mouth daily.) 30 tablet 5   metroNIDAZOLE (FLAGYL) 500 MG tablet Take 1 tablet (500 mg total) by mouth 2 (two) times daily for 10 days. 20 tablet 0   Roflumilast (ZORYVE) 0.3 % CREA Apply 1 application  topically daily. 60 g 6   sertraline (ZOLOFT) 50 MG tablet Take 50 mg by mouth daily.     sildenafil (VIAGRA) 100 MG tablet Take 0.5-1 tablets (50-100 mg total) by mouth daily as needed for erectile dysfunction. 5 tablet 11   tiZANidine (ZANAFLEX) 4 MG tablet Take 4-8 mg by mouth at bedtime as needed.     valACYclovir (VALTREX) 1000 MG tablet Take 1 tablet (1,000 mg total) by mouth as directed. Take 2 po with first symptoms of fever blister then 2 po 12 hours later 30 tablet 11   verapamil (CALAN-SR) 240 MG CR tablet Take 240 mg by mouth daily.  3   No current facility-administered medications on file prior to visit.   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  Other Topics Concern   Not on file  Social History Narrative   Not on file   Social Determinants of Health   Financial Resource Strain: Low Risk  (02/21/2023)   Received from [facility], [facility]   Overall Financial Resource Strain (CARDIA)    Difficulty of Paying Living Expenses: Not very hard  Food Insecurity: No Food Insecurity (02/21/2023)    Received from [facility], [facility]   Hunger Vital Sign    Worried About Running Out of Food in the Last Year: Never true    Ran Out of Food in the Last Year: Never true  Transportation Needs: No Transportation Needs (02/21/2023)   Received from [facility], [facility]   PRAPARE - Transportation    Lack of Transportation (Medical): No    Lack of Transportation (Non-Medical): No  Physical Activity: Unknown (10/22/2022)   Received from [facility], [facility]   Exercise Vital Sign    Days of Exercise per Week: Patient declined    Minutes of Exercise per Session: Not on file  Stress: Patient Declined (10/22/2022)   Received from [facility] Occupational [facility] - Occupational Stress Questionnaire    Feeling of Stress : Patient declined  Social Connections: Unknown (02/02/2022)   Received from [facility], [facility]   Social Network    Social Network: Not on file  Intimate Partner Violence: Not At Risk (10/22/2022)   Received from [facility], [facility]   HITS    Over the last 12 months how often did your partner physically hurt you?: Never    Over the last 12 months how often did your partner insult you or talk down to you?: Never    Over the last 12 months how often did your partner threaten you with physical harm?: Never    Over the last 12 months how often did your partner scream or curse at you?: Never   Family History  Problem Relation Age of Onset   Diabetes Father    Heart disease Father    Hyperlipidemia Father    Hypertension Father      Review of Systems  Gastrointestinal:  Positive for abdominal pain.  All other systems reviewed and are negative.      Objective:   Physical Exam Vitals reviewed.  Constitutional:      General: He is not in acute distress.    Appearance: He is well-developed. He is not diaphoretic.  HENT:     Head: Normocephalic and atraumatic.     Right Ear: External ear  normal.     Left Ear: External ear normal.     Nose: Nose normal.  Eyes:     General:        Right eye: No discharge.        Left eye: No discharge.     Conjunctiva/sclera: Conjunctivae normal.  Neck:     Thyroid: No thyromegaly.  Vascular: No JVD.     Trachea: No tracheal deviation.  Cardiovascular:     Rate and Rhythm: Normal rate and regular rhythm.     Heart sounds: Normal heart sounds. No murmur heard.    No friction rub. No gallop.  Pulmonary:     Effort: Pulmonary effort is normal. No respiratory distress.     Breath sounds: Normal breath sounds. No stridor. No wheezing or rales.  Chest:     Chest wall: No tenderness.  Abdominal:     General: Bowel sounds are normal. There is no distension.     Palpations: Abdomen is soft. There is no mass.     Tenderness: There is no abdominal tenderness. There is no guarding or rebound.  Musculoskeletal:        General: No tenderness or deformity.     Cervical back: Normal range of motion and neck supple.  Lymphadenopathy:     Cervical: No cervical adenopathy.  Neurological:     Mental Status: He is alert and oriented to person, place, and time.     Cranial Nerves: No cranial nerve deficit.     Sensory: No sensory deficit.     Motor: No abnormal muscle tone.     Coordination: Coordination normal.           Assessment & Plan:  Need for shingles vaccine - Plan: Zoster Recombinant (Shingrix )  Diverticulitis Diverticulitis however this is resolving with Cipro and Flagyl.  He received his second shingles vaccine today.  He would like to wean off Zoloft.  I recommended decreasing the dose by 50% every 2 weeks until he is discontinued the medication

## 2023-08-17 ENCOUNTER — Other Ambulatory Visit: Payer: Self-pay | Admitting: Family Medicine

## 2023-08-17 DIAGNOSIS — I10 Essential (primary) hypertension: Secondary | ICD-10-CM

## 2023-08-17 NOTE — Telephone Encounter (Signed)
Requested Prescriptions  Pending Prescriptions Disp Refills   losartan-hydrochlorothiazide (HYZAAR) 50-12.5 MG tablet [Pharmacy Med Name: LOSARTAN-HCTZ 50-12.5 MG TAB] 90 tablet 0    Sig: TAKE 1 TABLET BY MOUTH EVERY DAY     Cardiovascular: ARB + Diuretic Combos Failed - 08/17/2023  1:34 AM      Failed - Valid encounter within last 6 months    Recent Outpatient Visits           1 year ago Metatarsalgia of left foot   [facility], Priscille Heidelberg, MD   1 year ago Bursitis of right foot   [facility], Priscille Heidelberg, MD   1 year ago Blepharitis of left lower eyelid, unspecified type   [facility] Pickard, Priscille Heidelberg, MD   1 year ago Blepharitis of left lower eyelid, unspecified type   [facility] Valentino Nose, NP   2 years ago Viral gastroenteritis   [facility] Pickard, Priscille Heidelberg, MD       Future Appointments             In 3 months Deirdre Evener, MD [facility]            Passed - K in normal range and within 180 days    Potassium  Date Value Ref Range Status  04/11/2023 4.0 3.5 - 5.3 mmol/L Final         Passed - Na in normal range and within 180 days    Sodium  Date Value Ref Range Status  04/11/2023 137 135 - 146 mmol/L Final         Passed - Cr in normal range and within 180 days    Creat  Date Value Ref Range Status  04/11/2023 1.23 0.70 - 1.30 mg/dL Final         Passed - eGFR is 10 or above and within 180 days    GFR, Est African American  Date Value Ref Range Status  12/31/2020 78 > OR = 60 mL/min/1.43m2 Final   GFR, Est Non African American  Date Value Ref Range Status  12/31/2020 67 > OR = 60 mL/min/1.86m2 Final   GFR, Estimated  Date Value Ref Range Status  05/25/2021 >60 >60 mL/min Final    Comment:    (NOTE) Calculated using the CKD-EPI Creatinine Equation (2021)    eGFR  Date Value Ref Range Status  04/11/2023 70 > OR  = 60 mL/min/1.29m2 Final         Passed - Patient is not pregnant      Passed - Last BP in normal range    BP Readings from Last 1 Encounters:  08/14/23 138/86

## 2023-08-28 ENCOUNTER — Other Ambulatory Visit: Payer: Self-pay | Admitting: Family Medicine

## 2023-08-29 NOTE — Telephone Encounter (Signed)
Requested medication (s) are due for refill today -yes  Requested medication (s) are on the active medication list -yes  Future visit scheduled no  Last refill: 06/01/23 #90  Notes to clinic: non delegated Rx  Requested Prescriptions  Pending Prescriptions Disp Refills   ALPRAZolam (XANAX) 0.5 MG tablet [Pharmacy Med Name: ALPRAZOLAM 0.5 MG TABLET] 90 tablet 0    Sig: Take 1 tablet (0.5 mg total) by mouth 3 (three) times daily as needed for anxiety.     Not Delegated - Psychiatry: Anxiolytics/Hypnotics 2 Failed - 08/28/2023  2:27 PM      Failed - This refill cannot be delegated      Failed - Urine Drug Screen completed in last 360 days      Failed - Valid encounter within last 6 months    Recent Outpatient Visits           1 year ago Metatarsalgia of left foot   [facility] Pickard, Priscille Heidelberg, MD   1 year ago Bursitis of right foot   [facility] Pickard, Priscille Heidelberg, MD   1 year ago Blepharitis of left lower eyelid, unspecified type   [facility] Pickard, Priscille Heidelberg, MD   1 year ago Blepharitis of left lower eyelid, unspecified type   [facility] Valentino Nose, NP   2 years ago Viral gastroenteritis   [facility] Tanya Nones, Priscille Heidelberg, MD       Future Appointments             In 3 months Deirdre Evener, MD [facility]            Passed - Patient is not pregnant         Requested Prescriptions  Pending Prescriptions Disp Refills   ALPRAZolam (XANAX) 0.5 MG tablet [Pharmacy Med Name: ALPRAZOLAM 0.5 MG TABLET] 90 tablet 0    Sig: Take 1 tablet (0.5 mg total) by mouth 3 (three) times daily as needed for anxiety.     Not Delegated - Psychiatry: Anxiolytics/Hypnotics 2 Failed - 08/28/2023  2:27 PM      Failed - This refill cannot be delegated      Failed - Urine Drug Screen completed in last 360 days      Failed - Valid encounter within last 6 months     Recent Outpatient Visits           1 year ago Metatarsalgia of left foot   [facility] Pickard, Priscille Heidelberg, MD   1 year ago Bursitis of right foot   [facility] Pickard, Priscille Heidelberg, MD   1 year ago Blepharitis of left lower eyelid, unspecified type   [facility] Pickard, Priscille Heidelberg, MD   1 year ago Blepharitis of left lower eyelid, unspecified type   [facility] Valentino Nose, NP   2 years ago Viral gastroenteritis   [facility] Pickard, Priscille Heidelberg, MD       Future Appointments             In 3 months Deirdre Evener, MD [facility]            Passed - Patient is not pregnant

## 2023-09-07 ENCOUNTER — Other Ambulatory Visit: Payer: Self-pay | Admitting: Family Medicine

## 2023-09-07 ENCOUNTER — Telehealth: Payer: Self-pay

## 2023-09-07 MED ORDER — ALPRAZOLAM 0.5 MG PO TABS: 0.5000 mg | ORAL_TABLET | Freq: Three times a day (TID) | ORAL | 0 refills | Status: DC | PRN

## 2023-09-07 NOTE — Telephone Encounter (Signed)
Copied from CRM [id number]. Topic: Clinical - Medication Refill >> Sep 07, 2023  4:32 PM Darren Baker wrote: Most Recent Primary Care Visit:  Provider: Lynnea Ferrier T  Department: [facility]  Visit Type: ACUTE  Date: 08/14/2023  Medication:  ALPRAZolam (XANAX) 0.5 MG tablet    Has the patient contacted their pharmacy? Yes (Agent: If no, request that the patient contact the pharmacy for the refill. If patient does not wish to contact the pharmacy document the reason why and proceed with request.) (Agent: If yes, when and what did the pharmacy advise?)  Is this the correct pharmacy for this prescription? Yes Was advised to call the provider (pt stated he called the pharmacy the prescription was last filled since August.   If no, delete pharmacy and type the correct one.  This is the patient's preferred pharmacy:  [id number] [place], [place] - [address] [place] [address] Phone: [phone] Fax: [phone]   Has the prescription been filled recently? Yes  Is the patient out of the medication? Yes  Has the patient been seen for an appointment in the last year OR does the patient have an upcoming appointment? Yes  Can we respond through MyChart? Yes  Agent: Please be advised that Rx refills may take up to 3 business days. We ask that you follow-up with your pharmacy.

## 2023-11-19 ENCOUNTER — Other Ambulatory Visit: Payer: Self-pay | Admitting: Family Medicine

## 2023-11-19 DIAGNOSIS — I10 Essential (primary) hypertension: Secondary | ICD-10-CM

## 2023-11-19 NOTE — Telephone Encounter (Signed)
 Prescription Request  11/19/2023  LOV: 08/14/2023  What is the name of the medication or equipment?   losartan-hydrochlorothiazide (HYZAAR) 50-12.5 MG tablet   Have you contacted your pharmacy to request a refill? Yes   Which pharmacy would you like this sent to?  [id number] [place], [place] - [address] [place] [address] Phone: [phone] Fax: [phone]    Patient notified that their request is being sent to the clinical staff for review and that they should receive a response within 2 business days.   Please advise pharmacist.

## 2023-11-20 NOTE — Telephone Encounter (Signed)
 Requested medications are due for refill today.  yes  Requested medications are on the active medications list.  yes  Last refill. 08/17/2023 #90 0 rf  Future visit scheduled.   no  Notes to clinic.  Please review for refill.     Requested Prescriptions  Pending Prescriptions Disp Refills   losartan-hydrochlorothiazide (HYZAAR) 50-12.5 MG tablet 90 tablet 0    Sig: Take 1 tablet by mouth daily.     Cardiovascular: ARB + Diuretic Combos Failed - 11/20/2023 10:25 AM      Failed - K in normal range and within 180 days    Potassium  Date Value Ref Range Status  04/11/2023 4.0 3.5 - 5.3 mmol/L Final         Failed - Na in normal range and within 180 days    Sodium  Date Value Ref Range Status  04/11/2023 137 135 - 146 mmol/L Final         Failed - Cr in normal range and within 180 days    Creat  Date Value Ref Range Status  04/11/2023 1.23 0.70 - 1.30 mg/dL Final         Failed - eGFR is 10 or above and within 180 days    GFR, Est African American  Date Value Ref Range Status  12/31/2020 78 > OR = 60 mL/min/1.60m2 Final   GFR, Est Non African American  Date Value Ref Range Status  12/31/2020 67 > OR = 60 mL/min/1.47m2 Final   GFR, Estimated  Date Value Ref Range Status  05/25/2021 >60 >60 mL/min Final    Comment:    (NOTE) Calculated using the CKD-EPI Creatinine Equation (2021)    eGFR  Date Value Ref Range Status  04/11/2023 70 > OR = 60 mL/min/1.2m2 Final         Failed - Valid encounter within last 6 months    Recent Outpatient Visits           1 year ago Metatarsalgia of left foot   [facility] Pick[facility], Priscille Heidelberg, MD   2 years ago Bursitis of right foot   [facility] Donita Brooks, MD   2 years ago Blepharitis of left lower eyelid, unspecified type   [facility] Donita Brooks, MD   2 years ago Blepharitis of left lower eyelid, unspecified type   [facility] Valentino Nose, NP   2 years ago Viral gastroenteritis   [facility] Pickard, Priscille Heidelberg, MD       Future Appointments             In 2 months Deirdre Evener, MD [facility]            Passed - Patient is not pregnant      Passed - Last BP in normal range    BP Readings from Last 1 Encounters:  08/14/23 138/86

## 2023-11-22 ENCOUNTER — Telehealth: Payer: Self-pay

## 2023-11-22 ENCOUNTER — Other Ambulatory Visit: Payer: Self-pay

## 2023-11-22 DIAGNOSIS — I10 Essential (primary) hypertension: Secondary | ICD-10-CM

## 2023-11-22 MED ORDER — LOSARTAN POTASSIUM-HCTZ 50-12.5 MG PO TABS: 1.0000 | ORAL_TABLET | Freq: Every day | ORAL | 0 refills | Status: DC

## 2023-11-22 NOTE — Telephone Encounter (Signed)
 Copied from CRM [id number]. Topic: Clinical - Prescription Issue >> Nov 22, 2023 11:14 AM Fonda Kinder J wrote: Reason for CRM: Pt states his pharmacy is awaiting authorization for prescription refill :losartan-hydrochlorothiazide (HYZAAR) 50-12.5 MG tablet

## 2023-12-05 ENCOUNTER — Ambulatory Visit: Payer: BC Managed Care – PPO | Admitting: Dermatology

## 2023-12-13 ENCOUNTER — Other Ambulatory Visit: Payer: Self-pay | Admitting: Family Medicine

## 2023-12-13 NOTE — Telephone Encounter (Signed)
 Requested medication (s) are due for refill today: yes  Requested medication (s) are on the active medication list: yes  Last refill:  09/07/23  Future visit scheduled: no  Notes to clinic:  Unable to refill per protocol, cannot delegate.      Requested Prescriptions  Pending Prescriptions Disp Refills   ALPRAZolam (XANAX) 0.5 MG tablet [Pharmacy Med Name: ALPRAZOLAM 0.5 MG TABLET] 90 tablet 0    Sig: Take 1 tablet (0.5 mg total) by mouth 3 (three) times daily as needed for anxiety.     Not Delegated - Psychiatry: Anxiolytics/Hypnotics 2 Failed - 12/13/2023  2:57 PM      Failed - This refill cannot be delegated      Failed - Urine Drug Screen completed in last 360 days      Failed - Valid encounter within last 6 months    Recent Outpatient Visits           1 year ago Metatarsalgia of left foot   [facility] Tanya Nones, Priscille Heidelberg, MD   2 years ago Bursitis of right foot   [facility] Tanya Nones, Priscille Heidelberg, MD   2 years ago Blepharitis of left lower eyelid, unspecified type   [facility] Donita Brooks, MD   2 years ago Blepharitis of left lower eyelid, unspecified type   [facility] Valentino Nose, NP   2 years ago Viral gastroenteritis   [facility] Pickard, Priscille Heidelberg, MD       Future Appointments             In 2 months Deirdre Evener, MD [place] [facility]            Passed - Patient is not pregnant

## 2024-01-22 ENCOUNTER — Encounter: Payer: Self-pay | Admitting: Dermatology

## 2024-01-22 ENCOUNTER — Ambulatory Visit: Payer: Self-pay | Admitting: Dermatology

## 2024-01-22 DIAGNOSIS — L578 Other skin changes due to chronic exposure to nonionizing radiation: Secondary | ICD-10-CM | POA: Diagnosis not present

## 2024-01-22 DIAGNOSIS — L304 Erythema intertrigo: Secondary | ICD-10-CM | POA: Diagnosis not present

## 2024-01-22 DIAGNOSIS — W908XXA Exposure to other nonionizing radiation, initial encounter: Secondary | ICD-10-CM

## 2024-01-22 DIAGNOSIS — Z7189 Other specified counseling: Secondary | ICD-10-CM

## 2024-01-22 DIAGNOSIS — D229 Melanocytic nevi, unspecified: Secondary | ICD-10-CM

## 2024-01-22 DIAGNOSIS — L820 Inflamed seborrheic keratosis: Secondary | ICD-10-CM | POA: Diagnosis not present

## 2024-01-22 DIAGNOSIS — Z86018 Personal history of other benign neoplasm: Secondary | ICD-10-CM

## 2024-01-22 DIAGNOSIS — L821 Other seborrheic keratosis: Secondary | ICD-10-CM

## 2024-01-22 DIAGNOSIS — Z1283 Encounter for screening for malignant neoplasm of skin: Secondary | ICD-10-CM | POA: Diagnosis not present

## 2024-01-22 DIAGNOSIS — L409 Psoriasis, unspecified: Secondary | ICD-10-CM | POA: Diagnosis not present

## 2024-01-22 DIAGNOSIS — D1801 Hemangioma of skin and subcutaneous tissue: Secondary | ICD-10-CM

## 2024-01-22 DIAGNOSIS — Z79899 Other long term (current) drug therapy: Secondary | ICD-10-CM

## 2024-01-22 DIAGNOSIS — Z8619 Personal history of other infectious and parasitic diseases: Secondary | ICD-10-CM

## 2024-01-22 DIAGNOSIS — B078 Other viral warts: Secondary | ICD-10-CM

## 2024-01-22 DIAGNOSIS — L814 Other melanin hyperpigmentation: Secondary | ICD-10-CM

## 2024-01-22 DIAGNOSIS — L570 Actinic keratosis: Secondary | ICD-10-CM

## 2024-01-22 MED ORDER — ZORYVE 0.3 % EX CREA: 1.0000 "application " | TOPICAL_CREAM | Freq: Every day | CUTANEOUS | 11 refills | Status: AC

## 2024-01-22 NOTE — Patient Instructions (Addendum)
 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  Only need the 2 doses per episodes  Cryotherapy Aftercare  Wash gently with soap and water everyday.   Apply Vaseline and Band-Aid daily until healed.    Your prescription was sent to [facility] in [place]. A representative from [facility] will contact you within 3 business hours to verify your address and insurance information to schedule a free delivery. If for any reason you do not receive a phone call from them, please reach out to them. Their phone number is [phone] and their hours are Monday-Friday 9:00 am-5:00 pm.    Due to recent changes in healthcare laws, you may see results of your pathology and/or laboratory studies on MyChart before the doctors have had a chance to review them. We understand that in some cases there may be results that are confusing or concerning to you. Please understand that not all results are received at the same time and often the doctors may need to interpret multiple results in order to provide you with the best plan of care or course of treatment. Therefore, we ask that you please give us  2 business days to thoroughly review all your results before contacting the office for clarification. Should we see a critical lab result, you will be contacted sooner.   If You Need Anything After Your Visit  If you have any questions or concerns for your doctor, please call our main line at [phone] and press option 4 to reach your doctor's medical assistant. If no one answers, please leave a voicemail as directed and we will return your call as soon as possible. Messages left after 4 pm will be answered the following business day.   You may  also send us  a message via MyChart. We typically respond to MyChart messages within 1-2 business days.  For prescription refills, please ask your pharmacy to contact our office. Our fax number is [phone].  If you have an urgent issue when the clinic is closed that cannot wait until the next business day, you can page your doctor at the number below.    Please note that while we do our best to be available for urgent issues outside of office hours, we are not available 24/7.   If you have an urgent issue and are unable to reach us , you may choose to seek medical care at your doctor's office, retail clinic, urgent care center, or emergency room.  If you have a medical emergency, please immediately call 911 or go to the emergency department.  Pager Numbers  - Dr. Bary Likes: [phone]  - Dr. Annette Barters: [phone]  - Dr. Felipe Horton: [phone]   In the event of inclement weather, please call our main line at [phone] for an update on the status of any delays or closures.  Dermatology Medication Tips: Please keep the boxes that topical medications come in in order to help keep track of the instructions about where and how to use these. Pharmacies typically print the medication instructions only on the boxes and not directly on the medication tubes.   If your medication is too expensive, please contact our office at [phone] option 4 or send us  a message through MyChart.  We are unable to tell what your co-pay for medications will be in advance as this is different depending on your insurance coverage. However, we may be able to find a substitute medication at lower cost or fill out paperwork to get insurance to cover a needed medication.   If a prior authorization is required to get your medication covered by your insurance company, please allow us  1-2 business days to complete this process.  Drug prices often vary depending on where the prescription is filled and some  pharmacies may offer cheaper prices.  The website www.goodrx.com contains coupons for medications through different pharmacies. The prices here do not account for what the cost may be with help from insurance (it may be cheaper with your insurance), but the website can give you the price if you did not use any insurance.  - You can print the associated coupon and take it with your prescription to the pharmacy.  - You may also stop by our office during regular business hours and pick up a GoodRx coupon card.  - If you need your prescription sent electronically to a different pharmacy, notify our office through [facility] or by phone at [phone] option 4.     Si Usted Necesita Algo Despus de Su Visita  Tambin puede enviarnos un mensaje a travs de Clinical cytogeneticist. Por lo general respondemos a los mensajes de MyChart en el transcurso de 1 a 2 das hbiles.  Para renovar recetas, por favor pida a su farmacia que se ponga en contacto con nuestra oficina. Franz Jacks de fax es [place] [phone].  Si tiene un asunto urgente cuando la clnica est cerrada y que no puede esperar hasta el siguiente da hbil, puede llamar/localizar a su doctor(a) al nmero que aparece a continuacin.   Por favor, tenga en cuenta que aunque hacemos todo lo posible para estar disponibles para asuntos urgentes fuera del horario de [place], no estamos disponibles las 24 horas del da, los 7 [address]  [address] de la [place].   Si tiene un problema urgente y no puede comunicarse con nosotros, puede optar por buscar atencin mdica  en el consultorio de su doctor(a), en una clnica privada, en un centro de atencin urgente o en una sala de emergencias.  Si tiene Engineer, drilling, por favor llame inmediatamente al 911 o vaya a la sala de emergencias.  Nmeros de bper  - Dr. Bary Likes: [phone]  - Dra. Annette Barters: [phone]  - Dr. Felipe Horton: [phone]   En caso de inclemencias del tiempo, por favor llame a Lajuan Pila principal al [phone] para una actualizacin sobre el [place] de cualquier retraso o cierre.  Consejos para la medicacin en dermatologa: Por favor, guarde las cajas en las que vienen los medicamentos de uso tpico para ayudarle a seguir las instrucciones sobre dnde y cmo usarlos. Las farmacias generalmente imprimen las instrucciones del medicamento slo en las cajas y no directamente en los tubos del [place].   Si su medicamento es muy caro, por favor, pngase en contacto con Bettyjane Brunet llamando al [phone] y presione la opcin 4 o envenos un mensaje a travs de Clinical cytogeneticist.   No podemos decirle cul ser su copago por los medicamentos por adelantado ya que esto es diferente dependiendo de la cobertura de su seguro. Sin embargo, es posible que podamos encontrar un medicamento sustituto a Audiological scientist un formulario para que el seguro cubra el medicamento que se considera necesario.   Si se requiere una autorizacin previa para que  su compaa de seguros [place] su medicamento, por favor permtanos de 1 a 2 das hbiles para completar [address]  Los precios de los medicamentos varan con frecuencia dependiendo del Environmental consultant de dnde se surte la receta y alguna farmacias pueden ofrecer precios ms baratos.  El sitio web www.goodrx.com tiene cupones para medicamentos de Health and safety inspector. Los precios aqu no tienen en cuenta lo que podra costar con la ayuda del seguro (puede ser ms barato con su seguro), pero el sitio web puede darle el precio si no utiliz Tourist information centre manager.  - Puede imprimir el cupn correspondiente y llevarlo con su receta a la farmacia.  - Tambin puede pasar por nuestra oficina durante el horario de atencin regular y Education officer, museum una tarjeta de cupones de GoodRx.  - Si necesita que su receta se enve electrnicamente a una farmacia diferente, informe a nuestra oficina a travs de MyChart de [place] o por telfono llamando al [phone] y presione la  opcin 4.

## 2024-01-22 NOTE — Progress Notes (Signed)
 Follow-Up Visit   Subjective  Darren Baker is a 53 y.o. male who presents for the following: Skin Cancer Screening and Full Body Skin Exam check spot R dorsum hand ~67m, burns, getting larger, check spot R arm ~39m hx of bleeding, Hx of Dysplastic, Psoriasis scalp uses a solution not sure name, pt picks at, Intertrigo groin not sure what he is using  The patient presents for Total-Body Skin Exam (TBSE) for skin cancer screening and mole check. The patient has spots, moles and lesions to be evaluated, some may be new or changing and the patient may have concern these could be cancer.  The following portions of the chart were reviewed this encounter and updated as appropriate: medications, allergies, medical history  Review of Systems:  No other skin or systemic complaints except as noted in HPI or Assessment and Plan.  Objective  Well appearing patient in no apparent distress; mood and affect are within normal limits.  A full examination was performed including scalp, head, eyes, ears, nose, lips, neck, chest, axillae, abdomen, back, buttocks, bilateral upper extremities, bilateral lower extremities, hands, feet, fingers, toes, fingernails, and toenails. All findings within normal limits unless otherwise noted below.   Relevant physical exam findings are noted in the Assessment and Plan.  face x 3 (3) Stuck on waxy paps with erythema face x 9 (9) Pink scaly macules  Assessment & Plan   SKIN CANCER SCREENING PERFORMED TODAY.  ACTINIC DAMAGE - Chronic condition, secondary to cumulative UV/sun exposure - diffuse scaly erythematous macules with underlying dyspigmentation - Recommend daily broad spectrum sunscreen SPF 30+ to sun-exposed areas, reapply every 2 hours as needed.  - Staying in the shade or wearing long sleeves, sun glasses (UVA+UVB protection) and wide brim hats (4-inch brim around the entire circumference of the hat) are also recommended for sun protection.  - Call for  new or changing lesions.  LENTIGINES, SEBORRHEIC KERATOSES, HEMANGIOMAS - Benign normal skin lesions - Benign-appearing - Call for any changes  MELANOCYTIC NEVI - Tan-brown and/or pink-flesh-colored symmetric macules and papules - Benign appearing on exam today - Observation - Call clinic for new or changing moles - Recommend daily use of broad spectrum spf 30+ sunscreen to sun-exposed areas.   HISTORY OF DYSPLASTIC NEVUS No evidence of recurrence today Recommend regular full body skin exams Recommend daily broad spectrum sunscreen SPF 30+ to sun-exposed areas, reapply every 2 hours as needed.  Call if any new or changing lesions are noted between office visits  - L chest, L xyphoid  PSORIASIS scalp Exam: pink patches scalp 5% BSA. Chronic and persistent condition with duration or expected duration over one year. Condition is symptomatic/ bothersome to patient. Not currently at goal. patient denies joint pain  Psoriasis is a chronic non-curable, but treatable genetic/hereditary disease that may have other systemic features affecting other organ systems such as joints (Psoriatic Arthritis). It is associated with an increased risk of inflammatory bowel disease, heart disease, non-alcoholic fatty liver disease, and depression.  Treatments include light and laser treatments; topical medications; and systemic medications including oral and injectables.  Treatment Plan: Start Zoryve  0.3% cr qd aa scalp (pt has at home)  INTERTRIGO Groin, buttocks Exam: Erythematous macerated patches in body folds Chronic and persistent condition with duration or expected duration over one year. Condition is symptomatic/ bothersome to patient. Not currently at goal. Intertrigo is a chronic recurrent rash that occurs in skin fold areas that may be associated with friction; heat; moisture; yeast; fungus; and  bacteria.  It is exacerbated by increased movement / activity; sweating; and higher atmospheric  temperature.  Use of an absorbant powder such as Zeasorb AF powder or other OTC antifungal powder to the area daily can prevent rash recurrence. Other options to help keep the area dry include blow drying the area after bathing or using antiperspirant products such as Duradry sweat minimizing gel.  Treatment Plan: Start Zoryve  cr qd  HERPESVIRAL INFECTION  (cold sores) Exam buttocks clear today, lips clear today Chronic and persistent condition with duration or expected duration over one year. Condition is symptomatic / bothersome to patient. Not to goal. Herpes Simplex Virus = Cold Sores = Fever Blisters is a chronic recurring blistering; scabbing sore-producing viral infection that is recurrent usually in the same area triggered by stress, sun/UV exposure and trauma.  It is infectious and can be spread from person to person by direct contact.  It is not curable, but is treatable with topical and oral medication.  Treatment Plan Start Valtrex  1g 2 po at onset of outbreak of buttocks or lips then 2 po 12 hours later   INFLAMED SEBORRHEIC KERATOSIS (3) face x 3 (3) Symptomatic, irritating, patient would like treated. Destruction of lesion - face x 3 (3) Complexity: simple   Destruction method: cryotherapy   Informed consent: discussed and consent obtained   Timeout:  patient name, date of birth, surgical site, and procedure verified Lesion destroyed using liquid nitrogen: Yes   Region frozen until ice ball extended beyond lesion: Yes   Outcome: patient tolerated procedure well with no complications   Post-procedure details: wound care instructions given   AK (ACTINIC KERATOSIS) (9) face x 9 (9) Actinic keratoses are precancerous spots that appear secondary to cumulative UV radiation exposure/sun exposure over time. They are chronic with expected duration over 1 year. A portion of actinic keratoses will progress to squamous cell carcinoma of the skin. It is not possible to reliably predict  which spots will progress to skin cancer and so treatment is recommended to prevent development of skin cancer.  Recommend daily broad spectrum sunscreen SPF 30+ to sun-exposed areas, reapply every 2 hours as needed.  Recommend staying in the shade or wearing long sleeves, sun glasses (UVA+UVB protection) and wide brim hats (4-inch brim around the entire circumference of the hat). Call for new or changing lesions. Destruction of lesion - face x 9 (9) Complexity: simple   Destruction method: cryotherapy   Informed consent: discussed and consent obtained   Timeout:  patient name, date of birth, surgical site, and procedure verified Lesion destroyed using liquid nitrogen: Yes   Region frozen until ice ball extended beyond lesion: Yes   Outcome: patient tolerated procedure well with no complications   Post-procedure details: wound care instructions given   OTHER VIRAL WARTS R dorsum hand x 1 Viral Wart (HPV) Counseling  Discussed viral / HPV (Human Papilloma Virus) etiology and risk of spread /infectivity to other areas of body as well as to other people.  Multiple treatments and methods may be required to clear warts and it is possible treatment may not be successful.  Treatment risks include discoloration; scarring and there is still potential for wart recurrence. Destruction of lesion - R dorsum hand x 1 Complexity: simple   Destruction method: cryotherapy   Informed consent: discussed and consent obtained   Timeout:  patient name, date of birth, surgical site, and procedure verified Lesion destroyed using liquid nitrogen: Yes   Region frozen until ice ball extended  beyond lesion: Yes   Outcome: patient tolerated procedure well with no complications   Post-procedure details: wound care instructions given   PSORIASIS   Related Medications Roflumilast  (ZORYVE ) 0.3 % CREA Apply 1 application  topically daily. qd to aa rash scalp, and groin prn flares Return in about 6 months (around  07/23/2024) for AK f/u.  I, Rollie Clipper, RMA, am acting as scribe for Celine Collard, MD .   Documentation: I have reviewed the above documentation for accuracy and completeness, and I agree with the above.  Celine Collard, MD

## 2024-01-29 ENCOUNTER — Ambulatory Visit: Payer: Self-pay

## 2024-01-29 NOTE — Telephone Encounter (Signed)
 Copied from CRM [id number]. Topic: Clinical - Red Word Triage >> Jan 29, 2024  2:39 PM [place] E wrote: [facility] that prompted transfer to Nurse Triage: Dizziness. Patient stated he has been experiencing extreme dizziness the past 2 days and thinking it could be an inner ear issue.   Chief Complaint: dizziness Symptoms: earache and headache Frequency: instant Pertinent Negatives: Patient denies chest pain Disposition: [] ED /[] Urgent Care (no appt availability in office) / [] Appointment(In office/virtual)/ []  [facility]/ [] Home Care/ [] Refused Recommended Disposition /[] [facility]/ []  Follow-up with PCP Additional Notes: Pt reports dizzy episodes off/on last 2-3 days. States that he has been taking his meclizine  and it's only helping a little. Last dose was this morning at 11am. Pt also reports left side headache around his ear and ears felt "wet". Appt scheduled with Dr. Cheril Cork for 05/01.  Reason for Disposition  [1] MODERATE dizziness (e.g., vertigo; feels very unsteady, interferes with normal activities) AND [2] has been evaluated by doctor (or NP/PA) for this  Answer Assessment - Initial Assessment Questions 1. DESCRIPTION: "Describe your dizziness."     Room spinning  2. VERTIGO: "Do you feel like either you or the room is spinning or tilting?"      Spinning  3. LIGHTHEADED: "Do you feel lightheaded?" (e.g., somewhat faint, woozy, weak upon standing)     No  4. SEVERITY: "How bad is it?"  "Can you walk?"   - MILD: Feels slightly dizzy and unsteady, but is walking normally.   - MODERATE: Feels unsteady when walking, but not falling; interferes with normal activities (e.g., school, work).   - SEVERE: Unable to walk without falling, or requires assistance to walk without falling.     Mild to moderate  5. ONSET:  "When did the dizziness begin?"     2-3 days ago  6. AGGRAVATING FACTORS: "Does anything make it worse?" (e.g., standing, change in head  position)     Bending down, changing positios   7. CAUSE: "What do you think is causing the dizziness?"     Concerned for inner ear issue  8. RECURRENT SYMPTOM: "Have you had dizziness before?" If Yes, ask: "When was the last time?" "What happened that time?"     Almost 3 weeks ago, took some meclizine  and felt better afterward  9. OTHER SYMPTOMS: "Do you have any other symptoms?" (e.g., headache, weakness, numbness, vomiting, earache)     *No Answer* 10. PREGNANCY: "Is there any chance you are pregnant?" "When was your last menstrual period?"       *No Answer*  Protocols used: Dizziness - Vertigo-A-AH

## 2024-01-31 ENCOUNTER — Encounter: Payer: Self-pay | Admitting: Family Medicine

## 2024-01-31 ENCOUNTER — Ambulatory Visit (INDEPENDENT_AMBULATORY_CARE_PROVIDER_SITE_OTHER): Admitting: Family Medicine

## 2024-01-31 VITALS — BP 126/86 | HR 71 | Temp 97.5°F | Ht 71.0 in | Wt 266.0 lb

## 2024-01-31 DIAGNOSIS — R42 Dizziness and giddiness: Secondary | ICD-10-CM | POA: Diagnosis not present

## 2024-01-31 MED ORDER — MECLIZINE HCL 25 MG PO TABS: 25.0000 mg | ORAL_TABLET | Freq: Three times a day (TID) | ORAL | 0 refills | Status: DC | PRN

## 2024-01-31 NOTE — Progress Notes (Signed)
 Subjective:    Patient ID: Darren Baker, male    DOB: May 27, 1971, 53 y.o.   MRN: [id number] Patient reports dizziness over the last several days.  Patient states that with standing or turning he feels dizzy and lightheaded.  He denies any syncope or near syncope.  He reports it is almost like vertigo like feeling.  There is a feeling of movement and unsteadiness.  It is triggered by movement in his head.  On examination, both tympanic membranes appear normal.  There is no evidence of any ear infection.  He denies any sinus infection.  He denies any fevers or chills.  He has not had a recent headache.  His blood pressure has been low recently. Past Medical History:  Diagnosis Date   DDD (degenerative disc disease), lumbar    Dysplastic nevus 02/24/2016   L chest - mild    Dysplastic nevus 08/11/2021   Left xyphoid - moderate   Hypertension    Low back pain    Migraine    Obesity    Post concussion syndrome    Current Outpatient Medications on File Prior to Visit  Medication Sig Dispense Refill   acetaminophen  (TYLENOL ) 500 MG tablet Take 1,000 mg by mouth every 6 (six) hours as needed for moderate pain.     ALPRAZolam  (XANAX ) 0.5 MG tablet TAKE 1 TABLET (0.5 MG TOTAL) BY MOUTH 3 (THREE) TIMES DAILY AS NEEDED FOR ANXIETY. 90 tablet 0   eletriptan (RELPAX) 40 MG tablet Take 40 mg by mouth daily as needed for migraine. One tablet by mouth at onset of headache. May repeat in 2 hours if headache persists or recurs.     HYDROcodone -acetaminophen  (NORCO/VICODIN) 5-325 MG tablet Take 1 tablet by mouth every 6 (six) hours as needed for moderate pain. 90 tablet 0   linaclotide  (LINZESS ) 145 MCG CAPS capsule Take 1 capsule (145 mcg total) by mouth daily before breakfast. 90 capsule 3   losartan -hydrochlorothiazide (HYZAAR) 50-12.5 MG tablet Take 1 tablet by mouth daily. 90 tablet 0   magnesium oxide (MAG-OX) 400 (241.3 MG) MG tablet Take 400-800 mg by mouth daily.     meloxicam  (MOBIC ) 7.5 MG  tablet Take 1 tablet (7.5 mg total) by mouth daily. (Patient taking differently: Take 7.5-15 mg by mouth daily.) 30 tablet 5   Roflumilast  (ZORYVE ) 0.3 % CREA Apply 1 application  topically daily. qd to aa rash scalp, and groin prn flares 60 g 11   sertraline  (ZOLOFT ) 50 MG tablet Take 50 mg by mouth daily.     sildenafil  (VIAGRA ) 100 MG tablet Take 0.5-1 tablets (50-100 mg total) by mouth daily as needed for erectile dysfunction. 5 tablet 11   tiZANidine (ZANAFLEX) 4 MG tablet Take 4-8 mg by mouth at bedtime as needed.     valACYclovir  (VALTREX ) 1000 MG tablet Take 1 tablet (1,000 mg total) by mouth as directed. Take 2 po with first symptoms of fever blister then 2 po 12 hours later 30 tablet 11   verapamil (CALAN-SR) 240 MG CR tablet Take 240 mg by mouth daily.  3   No current facility-administered medications on file prior to visit.   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  Other Topics Concern   Not on file  Social History Narrative   Not on file   Social Drivers of Health   Financial Resource Strain: Low Risk  (02/21/2023)   Received from [facility], [facility]   Overall Financial Resource Strain (CARDIA)    Difficulty of Paying Living Expenses: Not very hard  Food Insecurity: No Food Insecurity (02/21/2023)   Received from [facility], [facility]   Hunger Vital Sign    Worried About Running Out of Food in the Last Year: Never true    Ran Out of Food  in the Last Year: Never true  Transportation Needs: No Transportation Needs (02/21/2023)   Received from [facility], [facility]   PRAPARE - Transportation    Lack of Transportation (Medical): No    Lack of Transportation (Non-Medical): No  Physical Activity: Unknown (10/22/2022)   Received from [facility], [facility]   Exercise Vital Sign    Days of Exercise per Week: Patient declined    Minutes of Exercise per Session: Not on file  Stress: Patient Declined (10/22/2022)   Received from [facility], [facility] - Occupational Stress Questionnaire    Feeling of Stress : Patient declined  Social Connections: Unknown (02/02/2022)   Received from [facility], [facility]   Social Network    Social Network: Not on file  Intimate Partner Violence: Not At Risk (10/22/2022)   Received from [facility], [facility]   HITS    Over the last 12 months how often did your partner physically hurt you?: Never    Over the last 12 months how often did your partner insult you or talk down to you?: Never    Over the last 12 months how often did your partner threaten you with physical harm?: Never    Over the last 12 months how often did your partner scream or curse at you?: Never   Family History  Problem Relation Age of Onset   Diabetes Father    Heart disease Father    Hyperlipidemia Father    Hypertension Father      Review of Systems  Gastrointestinal:  Positive for abdominal pain.  All other systems reviewed and are negative.      Objective:   Physical Exam Vitals reviewed.  Constitutional:      General: He is not in acute distress.    Appearance: He is well-developed. He is not diaphoretic.  HENT:     Head: Normocephalic and atraumatic.     Right Ear: External ear normal.     Left Ear: External ear normal.     Nose: Nose normal.  Eyes:     General:        Right eye: No discharge.        Left eye: No discharge.      Conjunctiva/sclera: Conjunctivae normal.  Neck:     Thyroid: No thyromegaly.     Vascular: No JVD.     Trachea: No tracheal deviation.  Cardiovascular:     Rate and Rhythm: Normal rate and regular rhythm.     Heart sounds: Normal heart  sounds. No murmur heard.    No friction rub. No gallop.  Pulmonary:     Effort: Pulmonary effort is normal. No respiratory distress.     Breath sounds: Normal breath sounds. No stridor. No wheezing or rales.  Chest:     Chest wall: No tenderness.  Abdominal:     General: Bowel sounds are normal. There is no distension.     Palpations: Abdomen is soft. There is no mass.     Tenderness: There is no abdominal tenderness. There is no guarding or rebound.  Musculoskeletal:        General: No tenderness or deformity.     Cervical back: Normal range of motion and neck supple.  Lymphadenopathy:     Cervical: No cervical adenopathy.  Neurological:     Mental Status: He is alert and oriented to person, place, and time.     Cranial Nerves: No cranial nerve deficit.     Sensory: No sensory deficit.     Motor: No abnormal muscle tone.     Coordination: Coordination normal.           Assessment & Plan:  Dizziness I suspect vertigo versus orthostatic hypotension.  Recommended meclizine  25 mg every 8 hours as needed.  Recommended reducing verapamil from 240 mg to 120 mg/day and pushing fluid to avoid dehydration

## 2024-02-12 ENCOUNTER — Ambulatory Visit: Payer: BC Managed Care – PPO | Admitting: Dermatology

## 2024-02-17 ENCOUNTER — Other Ambulatory Visit: Payer: Self-pay | Admitting: Family Medicine

## 2024-02-17 DIAGNOSIS — I10 Essential (primary) hypertension: Secondary | ICD-10-CM

## 2024-02-19 ENCOUNTER — Other Ambulatory Visit: Payer: Self-pay

## 2024-02-19 ENCOUNTER — Telehealth: Payer: Self-pay

## 2024-02-19 DIAGNOSIS — I10 Essential (primary) hypertension: Secondary | ICD-10-CM

## 2024-02-19 NOTE — Telephone Encounter (Signed)
 OV 01/31/24 Requested Prescriptions  Pending Prescriptions Disp Refills   losartan -hydrochlorothiazide (HYZAAR) 50-12.5 MG tablet [Pharmacy Med Name: LOSARTAN -HCTZ 50-12.5 MG TAB] 90 tablet 0    Sig: TAKE 1 TABLET BY MOUTH EVERY DAY     Cardiovascular: ARB + Diuretic Combos Failed - 02/19/2024  2:55 PM      Failed - K in normal range and within 180 days    Potassium  Date Value Ref Range Status  04/11/2023 4.0 3.5 - 5.3 mmol/L Final         Failed - Na in normal range and within 180 days    Sodium  Date Value Ref Range Status  04/11/2023 137 135 - 146 mmol/L Final         Failed - Cr in normal range and within 180 days    Creat  Date Value Ref Range Status  04/11/2023 1.23 0.70 - 1.30 mg/dL Final         Failed - eGFR is 10 or above and within 180 days    GFR, Est African American  Date Value Ref Range Status  12/31/2020 78 > OR = 60 mL/min/1.31m2 Final   GFR, Est Non African American  Date Value Ref Range Status  12/31/2020 67 > OR = 60 mL/min/1.14m2 Final   GFR, Estimated  Date Value Ref Range Status  05/25/2021 >60 >60 mL/min Final    Comment:    (NOTE) Calculated using the CKD-EPI Creatinine Equation (2021)    eGFR  Date Value Ref Range Status  04/11/2023 70 > OR = 60 mL/min/1.46m2 Final         Failed - Valid encounter within last 6 months    Recent Outpatient Visits           2 weeks ago Dizziness   [place] [facility] Austine Lefort, MD   6 months ago Need for shingles vaccine   [place] [facility] Austine Lefort, MD   10 months ago Need for shingles vaccine   [place] [facility] Austine Lefort, MD   11 months ago Obesity, unspecified classification, unspecified obesity type, unspecified whether serious comorbidity present   [place] [facility] Austine Lefort, MD   1 year ago Hematospermia   [place] [facility] Pickard, Cisco Crest, MD        Future Appointments             In 4 months Elta Halter, MD [facility]            Passed - Patient is not pregnant      Passed - Last BP in normal range    BP Readings from Last 1 Encounters:  01/31/24 126/86

## 2024-02-19 NOTE — Telephone Encounter (Signed)
 Copied from CRM [id number]. Topic: Clinical - Prescription Issue >> Feb 19, 2024  2:50 PM Darren Baker wrote: Reason for CRM: Patient called. States pharmacy requested medication losartan -hydrochlorothiazide (HYZAAR) 50-12.5 MG tablet but has not received a response. Advised him to call and check status. Patient only has 4 left. Thank You

## 2024-02-22 ENCOUNTER — Ambulatory Visit: Payer: Self-pay

## 2024-02-22 ENCOUNTER — Encounter: Payer: Self-pay | Admitting: Family Medicine

## 2024-02-22 ENCOUNTER — Ambulatory Visit (INDEPENDENT_AMBULATORY_CARE_PROVIDER_SITE_OTHER): Admitting: Family Medicine

## 2024-02-22 VITALS — BP 140/88 | HR 71 | Temp 98.2°F | Ht 71.0 in | Wt 269.0 lb

## 2024-02-22 DIAGNOSIS — J029 Acute pharyngitis, unspecified: Secondary | ICD-10-CM

## 2024-02-22 DIAGNOSIS — J209 Acute bronchitis, unspecified: Secondary | ICD-10-CM | POA: Diagnosis not present

## 2024-02-22 MED ORDER — ALBUTEROL SULFATE HFA 108 (90 BASE) MCG/ACT IN AERS: 1.0000 | INHALATION_SPRAY | Freq: Four times a day (QID) | RESPIRATORY_TRACT | 0 refills | Status: DC | PRN

## 2024-02-22 MED ORDER — AZITHROMYCIN 250 MG PO TABS: ORAL_TABLET | ORAL | 0 refills | Status: AC

## 2024-02-22 MED ORDER — HYDROCOD POLI-CHLORPHE POLI ER 10-8 MG/5ML PO SUER: 5.0000 mL | Freq: Two times a day (BID) | ORAL | 0 refills | Status: DC | PRN

## 2024-02-22 NOTE — Progress Notes (Signed)
 Patient Office Visit  Assessment & Plan:  Acute bronchitis, unspecified organism -     Hydrocod Poli-Chlorphe Poli ER; Take 5 mLs by mouth every 12 (twelve) hours as needed for cough.  Dispense: 120 mL; Refill: 0 -     Azithromycin ; Take 2 tablets on day 1, then 1 tablet daily on days 2 through 5  Dispense: 6 tablet; Refill: 0 -     Albuterol Sulfate HFA; Inhale 1-2 puffs into the lungs every 6 (six) hours as needed for wheezing or shortness of breath.  Dispense: 1 each; Refill: 0  Sore throat   Assessment and Plan    Cough with wheezing Intermittent cough with wheezing for two weeks, worse at night and morning. Possible bronchitis or post-viral cough. Previous adverse reactions to Augmentin  noted. - Prescribed Z-Pak for potential bacterial infection. - Prescribed albuterol inhaler for wheezing. - Prescribed Tessalon Perles for cough suppression. - Prescribed hydrocodone  cough syrup for severe nighttime cough.          No follow-ups on file.   Subjective:     Patient ID: Darren Baker, male    DOB: Dec 15, 1970  Age: 53 y.o. MRN: [id number]  Chief Complaint  Patient presents with   Sore Throat    Started 2 weeks ago. Now pt has developed a cough since Wednesday.     Sore Throat    Discussed the use of AI scribe software for clinical note transcription with the patient, who gave verbal consent to proceed.  History of Present Illness        Darren Baker is a 53 year old male who presents with a persistent cough and respiratory symptoms.  His symptoms began on May 11th after exposure to a person who was coughing at his niece's graduation. He developed a sore throat while traveling to [place]  and initially managed his symptoms with salt water gargles, Sudafed, and Mucinex. By May 14th, his symptoms had improved, and he felt well enough to travel to [place].  On May 21st, he experienced a resurgence of symptoms, including a severe cough that is  particularly bothersome at night and in the morning. The cough is described as constant and is sometimes accompanied by yellowish sputum and occasional wheezing, worsening in the evening. Over-the-counter medications every six hours have provided some relief. No history of smoking or prior episodes of bronchitis. Nasal congestion and drainage have been managed with Sudafed.  He has a history of dizziness for which he was previously prescribed meclizine . He mentions a history of COVID-19 infection twice, which he believes has contributed to occasional wheezing sounds that he did not experience prior to the infections. He has received all COVID-19 vaccinations and boosters.  He has been losing weight intentionally, having lost about 30 pounds during [place], but has gained some weight back since Easter. 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 He is concerned about his symptoms persisting as he has an upcoming event and does not want to spread illness to others. Physical Exam HEENT: Throat with mild drainage and slight swelling of the uvula. Nasal passages not too clogged. CHEST: Lungs without wheezing on auscultation. Assessment & Plan Cough with wheezing Intermittent cough with wheezing for two weeks, worse at night and morning. Possible bronchitis or post-viral cough. Previous adverse reactions to Augmentin  noted. - Prescribed Z-Pak for potential bacterial infection. - Prescribed albuterol inhaler for wheezing. - Prescribed Tessalon Perles for cough suppression. - Prescribed hydrocodone  cough syrup for  severe nighttime cough.   The 10-year ASCVD risk score (Arnett DK, et al., 2019) is: 6.3%  Past Medical History:  Diagnosis Date   DDD (degenerative disc disease), lumbar    Dysplastic nevus 02/24/2016   L chest - mild    Dysplastic nevus 08/11/2021   Left xyphoid - moderate   Hypertension    Low back pain    Migraine    Obesity    Post concussion syndrome     History reviewed. No pertinent surgical history. Social History   Tobacco Use   Smoking status: Never   Smokeless tobacco: Never  Vaping Use   Vaping status: Never Used  Substance Use Topics   Alcohol  use: Yes    Comment: Rare   Drug use: No   Family History  Problem Relation Age of Onset   Diabetes Father    Heart disease Father    Hyperlipidemia Father    Hypertension Father    Allergies  Allergen Reactions   Augmentin  [Amoxicillin -Pot Clavulanate] Nausea And Vomiting    ROS    Objective:    BP (!) 140/88   Pulse 71   Temp 98.2 F (36.8 C)   Ht 5\' 11"  (1.803 m)   Wt 269 lb (122 kg)   SpO2 99%   BMI 37.52 kg/m  BP Readings from Last 3 Encounters:  02/22/24 (!) 140/88  01/31/24 126/86  08/14/23 138/86   Wt Readings from Last 3 Encounters:  02/22/24 269 lb (122 kg)  01/31/24 266 lb (120.7 kg)  08/14/23 288 lb (130.6 kg)    Physical Exam Vitals and nursing note reviewed.  Constitutional:      General: He is not in acute distress.    Appearance: Normal appearance.  HENT:     Head: Normocephalic.     Right Ear: Tympanic membrane, ear canal and external ear normal.     Left Ear: Tympanic membrane, ear canal and external ear normal.     Nose: Congestion present.     Right Sinus: No maxillary sinus tenderness.     Left Sinus: No maxillary sinus tenderness.     Mouth/Throat:     Pharynx: No oropharyngeal exudate or posterior oropharyngeal erythema.  Eyes:     Extraocular Movements: Extraocular movements intact.     Pupils: Pupils are equal, round, and reactive to light.  Cardiovascular:     Rate and Rhythm: Normal rate and regular rhythm.     Heart sounds: Normal heart sounds.  Pulmonary:     Effort: Pulmonary effort is normal.     Breath sounds: Normal breath sounds. No wheezing or rhonchi.  Neurological:     General: No focal deficit present.     Mental Status: He is alert.      No results found for any visits on 02/22/24.

## 2024-02-22 NOTE — Telephone Encounter (Signed)
  Chief Complaint: near constant dry cough Symptoms: above Frequency: since weds Pertinent Negatives: Patient denies fever Disposition: [] ED /[] Urgent Care (no appt availability in office) / [x] Appointment(In office/virtual)/ []  [place] [facility]/ [] Home Care/ [] Refused Recommended Disposition /[] [facility]/ []  Follow-up with PCP Additional Notes: Pt had URI that seemed to resolve. Now pt has near constant dry cough. Appt his morning in office.    Copied from CRM [id number]. Topic: Clinical - Red Word Triage >> Feb 22, 2024  8:20 AM Elle L wrote: Red Word that prompted transfer to Nurse Triage: The patient states he started having throat pain and congestion that lasted 2-3 days and was treated with over the counter medication. However, now he has a worsening cough. Reason for Disposition  [1] Continuous (nonstop) coughing interferes with work or school AND [2] no improvement using cough treatment per Care Advice  Answer Assessment - Initial Assessment Questions 1. ONSET: "When did the cough begin?"      Weds night 2. SEVERITY: "How bad is the cough today?"      Near constant 3. SPUTUM: "Describe the color of your sputum" (none, dry cough; clear, white, yellow, green)     Yellow - not often 4. HEMOPTYSIS: "Are you coughing up any blood?" If so ask: "How much?" (flecks, streaks, tablespoons, etc.)     no 5. DIFFICULTY BREATHING: "Are you having difficulty breathing?" If Yes, ask: "How bad is it?" (e.g., mild, moderate, severe)    - MILD: No SOB at rest, mild SOB with walking, speaks normally in sentences, can lie down, no retractions, pulse < 100.    - MODERATE: SOB at rest, SOB with minimal exertion and prefers to sit, cannot lie down flat, speaks in phrases, mild retractions, audible wheezing, pulse 100-120.    - SEVERE: Very SOB at rest, speaks in single words, struggling to breathe, sitting hunched forward, retractions, pulse > 120      no 6. FEVER: "Do you have a  fever?" If Yes, ask: "What is your temperature, how was it measured, and when did it start?"     no 7. CARDIAC HISTORY: "Do you have any history of heart disease?" (e.g., heart attack, congestive heart failure)      no 8. LUNG HISTORY: "Do you have any history of lung disease?"  (e.g., pulmonary embolus, asthma, emphysema)     no 10. OTHER SYMPTOMS: "Do you have any other symptoms?" (e.g., runny nose, wheezing, chest pain)       no  Protocols used: Cough - Acute Non-Productive-A-AH

## 2024-03-04 ENCOUNTER — Telehealth: Payer: Self-pay

## 2024-03-04 NOTE — Telephone Encounter (Signed)
 Copied from CRM [id number]. Topic: Clinical - Medical Advice >> Mar 04, 2024  8:14 AM Essie A wrote: Reason for CRM: Pain in right lower abdomen, intensifies when bending over. Started 03/02/24. Wants to see Dr. Cheril Cork, not a nurse.  He wants to know if Dr. Cheril Cork or Dr. Ferna How could fit him in today.  Please return his call at [phone].

## 2024-03-11 ENCOUNTER — Other Ambulatory Visit: Payer: Self-pay | Admitting: Family Medicine

## 2024-03-11 ENCOUNTER — Ambulatory Visit: Admitting: Family Medicine

## 2024-03-11 ENCOUNTER — Encounter: Payer: Self-pay | Admitting: Family Medicine

## 2024-03-11 VITALS — BP 128/88 | HR 68 | Temp 97.9°F | Ht 71.0 in | Wt 276.0 lb

## 2024-03-11 DIAGNOSIS — J209 Acute bronchitis, unspecified: Secondary | ICD-10-CM

## 2024-03-11 DIAGNOSIS — R0982 Postnasal drip: Secondary | ICD-10-CM | POA: Diagnosis not present

## 2024-03-11 DIAGNOSIS — K5900 Constipation, unspecified: Secondary | ICD-10-CM

## 2024-03-11 MED ORDER — PREDNISONE 10 MG (21) PO TBPK: ORAL_TABLET | ORAL | 0 refills | Status: DC

## 2024-03-11 MED ORDER — BENZONATATE 100 MG PO CAPS: 100.0000 mg | ORAL_CAPSULE | Freq: Three times a day (TID) | ORAL | 0 refills | Status: AC | PRN

## 2024-03-11 NOTE — Progress Notes (Signed)
 Patient Office Visit  Assessment & Plan:  Acute bronchitis, unspecified organism -     predniSONE ; Use as directed.  Dispense: 21 each; Refill: 0 -     Benzonatate; Take 1 capsule (100 mg total) by mouth 3 (three) times daily as needed.  Dispense: 30 capsule; Refill: 0  Postnasal drip -     predniSONE ; Use as directed.  Dispense: 21 each; Refill: 0  Constipation, unspecified constipation type   Assessment and Plan    Chronic Cough with Wheezing Persistent cough and wheezing with partial response to azithromycin  and cough medicine. Possible post-COVID-19 sequelae. Wheezing present, pneumonia unlikely. Postnasal drip may contribute. Prednisone  suggested to help with current symptoms - Prescribe prednisone  taper. - Prescribe Tessalon Perles for daytime cough. - Advise continued use of albuterol  inhaler. - Consider chest x-ray if symptoms persist or worsen. - Advise use of Benadryl  at night for postnasal drip. - Recommend Mucinex to help break up mucus.  Constipation Abdominal pain due to constipation with moderate stool burden. Symptoms improved with stool softeners and Miralax. - Continue use of stool softeners and Miralax as needed.  Chronic Back Pain with Bulging Discs Chronic back pain due to bulging discs, exacerbated after discontinuing meloxicam . - Consider resuming meloxicam  if appropriate. - Monitor back pain and consider further interventions if necessary.          Return if symptoms worsen or fail to improve.   Subjective:     Patient ID: Darren Baker, male    DOB: 1971-03-21  Age: 53 y.o. MRN: [id number]  Chief Complaint  Patient presents with   Cough    X 1 month.     Cough   Discussed the use of AI scribe software for clinical note transcription with the patient, who gave verbal consent to proceed.  History of Present Illness         Darren Baker is a 53 year old male who presents with persistent cough and wheezing and postnasal drip.  Also reviewed recent Urgent Care visit at [facility]  He has a persistent cough that has not improved despite taking azithromycin  (Z-Pak) and cough medicine. He started using an inhaler yesterday. The cough is deep rather than in the throat, and he has not been coughing up any yellow sputum since starting the Z-Pak. His nose is mostly open, but his voice is not quite right, and he frequently needs to clear his throat, especially at night, although nothing comes up. No throat pain, but he experiences shortness of breath and wheezing.  He experiences wheezing, which prompted him to use the inhaler. He has used the inhaler twice, with the first use at 6 AM today. The wheezing is more pronounced at night and in the morning, and he can sometimes hear a whistling sound when he breathes out. No throat pain, but he does experience shortness of breath and wheezing. He has been using Mucinex OTC  He mentions a previous episode of abdominal pain that was suspected to be related to his gallbladder. He visited the [place] [facility], where x-rays and blood work were performed. The x-ray showed a moderate stool burden, and he was advised to take Miralax and stool softeners, which resolved the pain. He takes three stool softeners daily to maintain regular bowel movements, as he would otherwise only have a bowel movement once a week. He tried Linzess , which was ineffective.  He has a history of back pain due to two bulging discs and notes increased pain since  stopping meloxicam . He had an epidural steroid injection in early May for his back pain. He recalls being on prednisone  as a child for allergies, which led to weight gain.  He mentions having COVID-19 twice, which he believes has contributed to occasional wheezing that he did not experience before. He has received all COVID-19 vaccinations and boosters. He notes that the sensation to cough is more pronounced in the afternoon and evening. Physical Exam HEENT:  Throat normal, no abnormalities, postnasal drip CHEST: Wheezing present, resolved after examination. Results LABS Alk Phos: 107 (03/04/2024)  RADIOLOGY Abdominal X-ray: nonobstructive bowel gas pattern, moderate stool burden (03/04/2024) Assessment & Plan Chronic Cough with Wheezing Persistent cough and wheezing with partial response to azithromycin  and cough medicine. Possible post-COVID-19 sequelae. Wheezing present, pneumonia unlikely. Postnasal drip may contribute. Prednisone  suggested to help with current symptoms - Prescribe prednisone  taper to take with food - Prescribe Tessalon Perles for daytime cough. - Advise continued use of albuterol  inhaler. - Consider chest x-ray if symptoms persist or worsen. - Advise use of Benadryl  at night for postnasal drip. - Recommend Mucinex to help break up mucus.  Constipation Abdominal pain due to constipation with moderate stool burden. Symptoms improved with stool softeners and Miralax. High fiber diet recommended - Continue use of stool softeners and Miralax as needed.  Chronic Back Pain with Bulging Discs Chronic back pain due to bulging discs, exacerbated after discontinuing meloxicam . - Consider resuming meloxicam  if appropriate. - Monitor back pain and consider further interventions if necessary.    The 10-year ASCVD risk score (Arnett DK, et al., 2019) is: 5.4%  Past Medical History:  Diagnosis Date   DDD (degenerative disc disease), lumbar    Dysplastic nevus 02/24/2016   L chest - mild    Dysplastic nevus 08/11/2021   Left xyphoid - moderate   Hypertension    Low back pain    Migraine    Obesity    Post concussion syndrome    History reviewed. No pertinent surgical history. Social History   Tobacco Use   Smoking status: Never   Smokeless tobacco: Never  Vaping Use   Vaping status: Never Used  Substance Use Topics   Alcohol  use: Yes    Comment: Rare   Drug use: No   Family History  Problem Relation Age of  Onset   Diabetes Father    Heart disease Father    Hyperlipidemia Father    Hypertension Father    Allergies  Allergen Reactions   Augmentin  [Amoxicillin -Pot Clavulanate] Nausea And Vomiting    Review of Systems  Respiratory:  Positive for cough.       Objective:    BP 128/88   Pulse 68   Temp 97.9 F (36.6 C)   Ht 5\' 11"  (1.803 m)   Wt 276 lb (125.2 kg)   SpO2 99%   BMI 38.49 kg/m  BP Readings from Last 3 Encounters:  03/11/24 128/88  02/22/24 (!) 140/88  01/31/24 126/86   Wt Readings from Last 3 Encounters:  03/11/24 276 lb (125.2 kg)  02/22/24 269 lb (122 kg)  01/31/24 266 lb (120.7 kg)    Physical Exam Vitals and nursing note reviewed.  Constitutional:      Appearance: Normal appearance.  HENT:     Head: Normocephalic.     Right Ear: Tympanic membrane and ear canal normal.     Left Ear: Tympanic membrane and ear canal normal.     Mouth/Throat:     Pharynx: No oropharyngeal exudate  or posterior oropharyngeal erythema.     Comments: Postnasal drip Eyes:     Extraocular Movements: Extraocular movements intact.     Pupils: Pupils are equal, round, and reactive to light.  Cardiovascular:     Rate and Rhythm: Normal rate and regular rhythm.     Heart sounds: Normal heart sounds.  Pulmonary:     Effort: Pulmonary effort is normal.     Breath sounds: Wheezing present. No rhonchi.     Comments: Post expiratory wheezes noted upper lung fields.  Musculoskeletal:     Right lower leg: No edema.     Left lower leg: No edema.  Neurological:     General: No focal deficit present.     Mental Status: He is alert and oriented to person, place, and time.  Psychiatric:        Mood and Affect: Mood normal.        Behavior: Behavior normal.        Thought Content: Thought content normal.        Judgment: Judgment normal.      No results found for any visits on 03/11/24.

## 2024-03-18 ENCOUNTER — Ambulatory Visit: Payer: Self-pay

## 2024-03-18 ENCOUNTER — Ambulatory Visit (HOSPITAL_COMMUNITY)
Admission: RE | Admit: 2024-03-18 | Discharge: 2024-03-18 | Disposition: A | Discharge status: Home / Self Care | Source: Ambulatory Visit | Attending: Family Medicine | Admitting: Family Medicine

## 2024-03-18 ENCOUNTER — Other Ambulatory Visit: Payer: Self-pay

## 2024-03-18 DIAGNOSIS — J209 Acute bronchitis, unspecified: Secondary | ICD-10-CM | POA: Diagnosis present

## 2024-03-18 NOTE — Telephone Encounter (Signed)
 FYI Only or Action Required?: Action required by provider  Patient was last seen in primary care on 03/11/2024 by Amadeo June, MD. Called Nurse Triage reporting URI. Symptoms began about a month ago. Interventions attempted: Prescription medications: prednisone . Symptoms are: unchanged.  Triage Disposition: See PCP When Office is Open (Within 3 Days)  Patient/caregiver understands and will follow disposition?: FYI Only or   Copied from CRM [id number]. Topic: Clinical - Red Word Triage >> Mar 18, 2024 10:14 AM Ivette P wrote: [facility] that prompted transfer to Nurse Triage: down in lungs, was told if it was still like this and schedule a chest x-ray   wheezing in the chest, wakes up at night. been on prednazone. cleared the head Reason for Disposition  Cough has been present for > 3 weeks  Answer Assessment - Initial Assessment Questions 1. ONSET: When did the cough begin?      About a month 2. SEVERITY: How bad is the cough today?      moderate 3. SPUTUM: Describe the color of your sputum (none, dry cough; clear, white, yellow, green)     Greenish/yellow 4. HEMOPTYSIS: Are you coughing up any blood? If so ask: How much? (flecks, streaks, tablespoons, etc.)     denies 5. DIFFICULTY BREATHING: Are you having difficulty breathing? If Yes, ask: How bad is it? (e.g., mild, moderate, severe)    - MILD: No SOB at rest, mild SOB with walking, speaks normally in sentences, can lie down, no retractions, pulse < 100.    - MODERATE: SOB at rest, SOB with minimal exertion and prefers to sit, cannot lie down flat, speaks in phrases, mild retractions, audible wheezing, pulse 100-120.    - SEVERE: Very SOB at rest, speaks in single words, struggling to breathe, sitting hunched forward, retractions, pulse > 120      denies 6. FEVER: Do you have a fever? If Yes, ask: What is your temperature, how was it measured, and when did it start?     denies 7. CARDIAC HISTORY: Do you have any  history of heart disease? (e.g., heart attack, congestive heart failure)      denies 8. LUNG HISTORY: Do you have any history of lung disease?  (e.g., pulmonary embolus, asthma, emphysema)     denies 9. PE RISK FACTORS: Do you have a history of blood clots? (or: recent major surgery, recent prolonged travel, bedridden)     denies 10. OTHER SYMPTOMS: Do you have any other symptoms? (e.g., runny nose, wheezing, chest pain)       Wheezing when coughing or at noc 12. TRAVEL: Have you traveled out of the country in the last month? (e.g., travel history, exposures)       denies  Pt was told to call back if the s/s do not improve, pt would need an XR.  Protocols used: Cough - Acute Productive-A-AH

## 2024-03-20 ENCOUNTER — Telehealth: Payer: Self-pay

## 2024-03-20 ENCOUNTER — Other Ambulatory Visit: Payer: Self-pay | Admitting: Family Medicine

## 2024-03-20 DIAGNOSIS — J209 Acute bronchitis, unspecified: Secondary | ICD-10-CM

## 2024-03-20 NOTE — Telephone Encounter (Signed)
 Copied from CRM [id number]. Topic: Clinical - Lab/Test Results >> Mar 20, 2024 11:50 AM Sophia H wrote: Reason for CRM: Patient would like a call back regarding Xray results - would prefer Dr. Ferna How take a look at them and give him a call to go over. Patient states he is still feeling pretty bad, if not worse. [phone]

## 2024-03-21 ENCOUNTER — Other Ambulatory Visit: Payer: Self-pay

## 2024-03-21 ENCOUNTER — Ambulatory Visit: Payer: Self-pay | Admitting: Family Medicine

## 2024-03-21 MED ORDER — LEVOFLOXACIN 500 MG PO TABS
500.0000 mg | ORAL_TABLET | Freq: Every day | ORAL | 0 refills | Status: AC
Start: 2024-03-21 — End: 2024-03-28

## 2024-03-24 ENCOUNTER — Encounter: Payer: Self-pay | Admitting: Family Medicine

## 2024-03-24 ENCOUNTER — Ambulatory Visit: Admitting: Family Medicine

## 2024-03-24 VITALS — BP 130/84 | HR 89 | Temp 98.1°F | Ht 71.0 in | Wt 271.0 lb

## 2024-03-24 DIAGNOSIS — J189 Pneumonia, unspecified organism: Secondary | ICD-10-CM | POA: Diagnosis not present

## 2024-03-24 DIAGNOSIS — R9389 Abnormal findings on diagnostic imaging of other specified body structures: Secondary | ICD-10-CM | POA: Diagnosis not present

## 2024-03-24 MED ORDER — HYDROCOD POLI-CHLORPHE POLI ER 10-8 MG/5ML PO SUER: 5.0000 mL | Freq: Two times a day (BID) | ORAL | 0 refills | Status: AC | PRN

## 2024-03-24 MED ORDER — DOXYCYCLINE HYCLATE 100 MG PO TABS: 100.0000 mg | ORAL_TABLET | Freq: Two times a day (BID) | ORAL | 0 refills | Status: AC

## 2024-03-24 NOTE — Progress Notes (Signed)
 Patient Office Visit  Assessment & Plan:  Pneumonia of left upper lobe due to infectious organism -     Doxycycline Hyclate; Take 1 tablet (100 mg total) by mouth 2 (two) times daily for 7 days.  Dispense: 14 tablet; Refill: 0 -     Hydrocod Poli-Chlorphe Poli ER; Take 5 mLs by mouth every 12 (twelve) hours as needed for cough.  Dispense: 120 mL; Refill: 0  Abnormal chest x-ray -     Doxycycline Hyclate; Take 1 tablet (100 mg total) by mouth 2 (two) times daily for 7 days.  Dispense: 14 tablet; Refill: 0   Assessment and Plan    Pneumonia seen on recent chest x-ray Recurrent pneumonia with recent exacerbation. Chest x-ray showed small focal opacity in left lower lobe. Allergy to Augmentin  noted. Prednisone  avoided due to interaction risks with Levaquin . - Continue Levaquin  for 4 more days. - Prescribe doxycycline as backup antibiotic. - Avoid Augmentin  due to previous GI distress. - Consider CT scan post-antibiotics if symptoms persist. we can do this next week - Provide cough syrup for symptomatic relief. - Send message next week to reassess CT scan need.  Allergy to Augmentin  Gastrointestinal distress with Augmentin , tolerates penicillin. - Avoid prescribing Augmentin .          No follow-ups on file.   Subjective:    Patient ID: Darren Baker, male    DOB: Mar 07, 1971  Age: 53 y.o. MRN: [id number]  Chief Complaint  Patient presents with   Pneumonia   Headache   Cough   Fatigue    HPI Discussed the use of AI scribe software for clinical note transcription with the patient, who gave verbal consent to proceed.  History of Present Illness         Darren Baker is a 53 year old male who presents with persistent respiratory symptoms and concerns about a recent chest x-ray. Chest x-ray was done last week.   His respiratory symptoms began on May 11th, coinciding with Mother's Day, and have fluctuated in severity since then. Initially, there was some  improvement, but symptoms worsened again by May 23rd and June 10th. Over the past week, his symptoms have been particularly severe, prompting him to seek further evaluation. He has persistent coughing, wheezing, and 'goggling' sounds during sleep, which have been disruptive to his rest. He also experiences headaches, ear popping, and clamminess. No fever or chills, but he feels clammy to the touch.  He underwent a chest x-ray last Tuesday due to worsening symptoms and is concerned about the radiologist's note mentioning a small focal opacity in the left lower lobe. He is frustrated with the delay in obtaining the radiologist's report, which was not available until late Thursday.  He has been taking Levaquin  once daily for his symptoms and is concerned about whether this is sufficient, especially since his wife was prescribed two antibiotics and prednisone  for similar symptoms. He has a history of adverse reactions to Augmentin , including nausea, vomiting, and cramping, which limits his antibiotic options. He uses an albuterol  inhaler twice daily, though he is uncertain about its effectiveness in alleviating his wheezing. He has previously been on prednisone , which he feels helped his symptoms, but he is cautious about its side effects.  His wife, who works in radiology, has also been experiencing respiratory symptoms after close contact with him, and she was diagnosed with double pneumonia at urgent care. She was prescribed cefdinir , doxycycline, and prednisone .  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 He does not smoke and has no significant exposure to other respiratory irritants. IMPRESSION: reviewed today 1. Small focal opacity in the left upper lobe. This may reflect chronic scarring, but was not included on the field of view from the prior exam so chronicity is unclear. It could reflect a small area of pneumonia. An ill-defined nodule is not excluded.  Consider follow-up chest CT for further assessment. Electronically Signed   By: Alm Parkins M.D.   On: 03/20/2024 09:27 Physical Exam HEENT: Right ear with popping sensation. CHEST: Lungs with abnormal breath sounds. Results RADIOLOGY Chest x-ray: Small focal opacity in the left lower lobe, possible scarring, ill-defined nodule not excluded (03/18/2024) Assessment & Plan Pneumonia seen on recent chest x-ray Recurrent pneumonia with recent exacerbation. Chest x-ray showed small focal opacity in left lower lobe. Allergy to Augmentin  noted. Prednisone  avoided due to interaction risks with Levaquin . - Continue Levaquin  for 4 more days. - Prescribe doxycycline as backup antibiotic. - Avoid Augmentin  due to previous GI distress. - Consider CT scan post-antibiotics if symptoms persist. we can do this next week - Provide cough syrup for symptomatic relief if necessary - Send message next week to reassess CT scan need. Will determine if he needs CT scan with or without contrast.   Allergy to Augmentin  Gastrointestinal distress with Augmentin , tolerates penicillin. - Avoid prescribing Augmentin .   The 10-year ASCVD risk score (Arnett DK, et al., 2019) is: 5.5%  Past Medical History:  Diagnosis Date   DDD (degenerative disc disease), lumbar    Dysplastic nevus 02/24/2016   L chest - mild    Dysplastic nevus 08/11/2021   Left xyphoid - moderate   Hypertension    Low back pain    Migraine    Obesity    Post concussion syndrome    History reviewed. No pertinent surgical history. Social History   Tobacco Use   Smoking status: Never   Smokeless tobacco: Never  Vaping Use   Vaping status: Never Used  Substance Use Topics   Alcohol  use: Yes    Comment: Rare   Drug use: No   Family History  Problem Relation Age of Onset   Diabetes Father    Heart disease Father    Hyperlipidemia Father    Hypertension Father    Allergies  Allergen Reactions   Amoxicillin -Pot Clavulanate  Nausea And Vomiting and Other (See Comments)    Did not feel right, had it recently    ROS    Objective:    BP 130/84   Pulse 89   Temp 98.1 F (36.7 C) (Temporal)   Ht 5' 11 (1.803 m)   Wt 271 lb (122.9 kg)   SpO2 96%   BMI 37.80 kg/m  BP Readings from Last 3 Encounters:  03/24/24 130/84  03/11/24 128/88  02/22/24 (!) 140/88   Wt Readings from Last 3 Encounters:  03/24/24 271 lb (122.9 kg)  03/11/24 276 lb (125.2 kg)  02/22/24 269 lb (122 kg)    Physical Exam Vitals and nursing note reviewed.  Constitutional:      General: He is not in acute distress.    Appearance: Normal appearance.  HENT:     Head: Normocephalic.     Right Ear: Tympanic membrane, ear canal and external ear normal.     Left Ear: Tympanic membrane, ear canal and external ear normal.     Mouth/Throat:     Comments: Postnasal drip  Eyes:     Extraocular  Movements: Extraocular movements intact.     Pupils: Pupils are equal, round, and reactive to light.    Cardiovascular:     Rate and Rhythm: Normal rate and regular rhythm.     Heart sounds: Normal heart sounds.  Pulmonary:     Effort: Pulmonary effort is normal. No respiratory distress.     Breath sounds: Normal breath sounds. No wheezing or rhonchi.   Neurological:     General: No focal deficit present.     Mental Status: He is alert and oriented to person, place, and time.   Psychiatric:        Mood and Affect: Mood normal.        Behavior: Behavior normal.      No results found for any visits on 03/24/24.

## 2024-03-28 ENCOUNTER — Ambulatory Visit: Payer: Self-pay

## 2024-03-28 NOTE — Telephone Encounter (Signed)
 FYI Only or Action Required?: Action required by provider: medication refill request, clinical question for provider, and update on patient condition.  Patient was last seen in primary care on 03/24/2024 by Aletha Bene, MD. Called Nurse Triage reporting Pneumonia follow up. Symptoms began several weeks ago. Interventions attempted: Prescription medications: antibiotics and cough medication and Rest, hydration, or home remedies. Symptoms are: gradually improving.  Triage Disposition: See PCP When Office is Open (Within 3 Days)  Patient/caregiver understands and will follow disposition?: No, wishes to speak with PCP   Copied from CRM [id number]. Topic: Clinical - Medication Question >> Mar 28, 2024  8:29 AM Montie POUR wrote: Reason for CRM:  Attn Dr. Bene Aletha: His pneumonia has improved but he is still coughing up yellow stuff and wants to know if he should get a refill of levofloxacin  (LEVAQUIN ) 500 MG tablet. This medication has been helping. Please call Dennie at [phone] to discuss Reason for Disposition  [1] Completed antibiotic treatment AND [2] breathing not back to normal  Answer Assessment - Initial Assessment Questions 1. SYMPTOM: What's the main symptom you're concerned about? (e.g., breathing difficulty, fever, weakness)     Productive cough, wheeze.  2. ONSET: When did the  cough  start?     May  3. BETTER-SAME-WORSE: Are you getting better, staying the same, or getting worse compared to the day you were discharged?     Cough has improved the past few days  4. BREATHING DIFFICULTY: Are you having any difficulty breathing? If Yes, ask: How bad is it?  (e.g., none, mild, moderate, severe)   - MILD: No SOB at rest, mild SOB with walking, speaks normally in sentences, can lie down, no retractions, pulse < 100.    - MODERATE: SOB at rest, SOB with minimal exertion and prefers to sit, cannot lie down flat, speaks in phrases, mild retractions, audible wheezing, pulse  100-120.    - SEVERE: Very SOB at rest, speaks in single words, struggling to breathe, sitting hunched forward, retractions, pulse > 120      Intermittent wheezing, mostly at night.  5. FEVER: Do you have a fever? If Yes, ask: What is your temperature, how was it measured, and when did it start?     Denies  6. SPUTUM: Describe the color of your sputum (clear, white, yellow, green, blood-tinged)     Yellow  7. DIAGNOSIS CONFIRMATION: When was the pneumonia diagnosed? By whom?     Yes-xray 8. ANTIBIOTIC: Are you taking an antibiotic?  If Yes, ask: Which one? When was it started?     Just finished Levaquin  yesterday, still on doxy until Sunday 9. OTHER TREATMENT: Are you receiving any other treatment for the pneumonia? (e.g., albuterol  nebulizer, oxygen) If Yes, ask: How often? and Does it help?     Finished prednisone . Cough medicine. 10. HOSPITAL ADMISSION: Were you hospitalized for this pneumonia? If Yes, ask: When were you discharged home from the hospital?       No  Additional info:  1) evaluated on Monday Dr. Aletha, discussed possible CT scan. Will this be ordered? 2) Since levaquin  seemed to be the only thing working he is wondering if he should take another round of levaquin ?  3) Patient would like call back today from Dr. Aletha (she has been following pna) or clinical team for plan going forward.  Protocols used: Pneumonia Follow-up Call-A-AH

## 2024-03-31 ENCOUNTER — Other Ambulatory Visit: Payer: Self-pay

## 2024-03-31 DIAGNOSIS — R9389 Abnormal findings on diagnostic imaging of other specified body structures: Secondary | ICD-10-CM

## 2024-03-31 DIAGNOSIS — J189 Pneumonia, unspecified organism: Secondary | ICD-10-CM

## 2024-04-08 ENCOUNTER — Ambulatory Visit: Admitting: Family Medicine

## 2024-04-09 ENCOUNTER — Ambulatory Visit: Admitting: Family Medicine

## 2024-04-09 NOTE — Telephone Encounter (Signed)
 Pt. Message sent with scheduling info. [phone] to [facility].

## 2024-04-10 ENCOUNTER — Ambulatory Visit (HOSPITAL_BASED_OUTPATIENT_CLINIC_OR_DEPARTMENT_OTHER)

## 2024-04-10 ENCOUNTER — Other Ambulatory Visit

## 2024-04-10 DIAGNOSIS — J189 Pneumonia, unspecified organism: Secondary | ICD-10-CM

## 2024-04-10 DIAGNOSIS — R9389 Abnormal findings on diagnostic imaging of other specified body structures: Secondary | ICD-10-CM | POA: Diagnosis not present

## 2024-04-10 MED ORDER — IOHEXOL 300 MG/ML  SOLN
100.0000 mL | Freq: Once | INTRAMUSCULAR | Status: AC | PRN
  Administered 2024-04-10: 75 mL via INTRAVENOUS

## 2024-04-14 ENCOUNTER — Ambulatory Visit: Payer: Self-pay | Admitting: Family Medicine

## 2024-04-16 ENCOUNTER — Ambulatory Visit: Admitting: Family Medicine

## 2024-04-16 ENCOUNTER — Encounter: Payer: Self-pay | Admitting: Family Medicine

## 2024-04-16 VITALS — BP 142/88 | HR 81 | Temp 98.1°F | Ht 71.0 in | Wt 278.0 lb

## 2024-04-16 DIAGNOSIS — Z8616 Personal history of COVID-19: Secondary | ICD-10-CM | POA: Diagnosis not present

## 2024-04-16 DIAGNOSIS — R9389 Abnormal findings on diagnostic imaging of other specified body structures: Secondary | ICD-10-CM | POA: Diagnosis not present

## 2024-04-16 DIAGNOSIS — J189 Pneumonia, unspecified organism: Secondary | ICD-10-CM | POA: Diagnosis not present

## 2024-04-16 DIAGNOSIS — H6992 Unspecified Eustachian tube disorder, left ear: Secondary | ICD-10-CM | POA: Diagnosis not present

## 2024-04-16 MED ORDER — FLUTICASONE PROPIONATE 50 MCG/ACT NA SUSP: 2.0000 | Freq: Every day | NASAL | 11 refills | Status: AC

## 2024-04-16 NOTE — Progress Notes (Unsigned)
 Patient Office Visit  Assessment & Plan:  Pneumonia of left upper lobe due to infectious organism  Abnormal CT of the chest  History of COVID-19  Other orders -     Fluticasone  Propionate; Place 2 sprays into both nostrils daily.  Dispense: 1 g; Refill: 11   Assessment and Plan    Persistent Cough with Biapical Scarring, follow up pneumonia Persistent cough with abnormalities seen on CT scan chest-  biapical scarring and pulmonary nodules. Regular exposure to Roundup noted. Lung cancer not a concern. Pulmonary specialist evaluation recommended. patient will discuss this with Dr. Duanne in a couple of weeks.  - Refer to pulmonary specialist for evaluation and possible pulmonary function tests. - Consider high-resolution CT scan if recommended by specialist.  Ear Congestion with Fluid/ETD Dull tympanic membrane with fluid, no infection. Suspected Eustachian tube dysfunction related to nasal congestion. - Prescribe nasal spray (Flonase  or Nasonex ) to relieve congestion.  Fatigue Persistent fatigue post-illness, recovery from pneumonia and previous COVID-19 history. - Consider further evaluation if fatigue persists.  Weight Gain 12-pound weight gain over two months, attributed to medication and reduced activity due to fatigue.  Follow-up Follow-up scheduled, considering pulmonary specialist for respiratory symptoms. - Follow up on July 29 for physical examination. - Discuss pulmonary specialist options and schedule appointment if desired.          No follow-ups on file.   Subjective:    Patient ID: CAIRO AGOSTINELLI, male    DOB: 1971/01/03  Age: 53 y.o. MRN: [id number]  Chief Complaint  Patient presents with   Follow-up    Pt is following up regarding his continuing sx of coughing, and tightness in his chest.     HPI Discussed the use of AI scribe software for clinical note transcription with the patient, who gave verbal consent to proceed.  History of Present  Illness        KERIN CECCHI is a 53 year old male who presents with persistent respiratory symptoms and concerns about CT scan findings.  He has been experiencing a persistent cough for about two months, which worsens at night and is accompanied by a rattling or cracking noise in his voice. He also reports deep congestion and night sweats severe enough to require changing his shirt. There is an urge to cough that is difficult to suppress. No significant wheezing but notes a rattling noise in his voice. No history of allergies.  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 He has been using Roundup consistently since the 1980s. His wife is concerned about this exposure, and he notes that a part-time worker refuses to use it due to health concerns.  He has experienced weight gain of twelve pounds over the past two months while taking medications. He continues to feel unusually tired, lacking the energy to leave the house or engage in usual activities.  He has a history of diverticulitis, for which he underwent a CT scan in September 2023. He also has a cyst on the liver, which was noted on imaging.  He has a history of COVID-19 infection , which he had twice, and he received all the recommended vaccines and boosters. He notes that his recovery from infections has been more challenging since having COVID-19.  He uses albuterol  inhaler and has previously been treated with a Z-Pak, cough medicine, cough pills, and prednisone  for his respiratory symptoms.  He has not used nasal spray for congestion but has experienced fluid in his left ear, which he describes as wetness upon waking. Physical Exam HEENT: Throat with some drainage, uvula slightly elongated. Left ear with dull tympanic membrane, some fluid, not red or bulging. CHEST: Lungs clear. Results RADIOLOGY Chest CT: Biapical scarring  and small triangular nodules along the right major fissure, stable from September 2023. No discrete finding to correlate with CXR Abdominal CT: Hepatic cyst, no mention of triangular nodules. (06/02/2022) Assessment & Plan Persistent Cough with Biapical Scarring, follow up pneumonia Persistent cough with abnormalities seen on CT scan chest-  biapical scarring and pulmonary nodules. Regular exposure to Roundup noted. Lung cancer not a concern. Pulmonary specialist evaluation recommended. patient will discuss this with Dr. Duanne in a couple of weeks.  - Refer to pulmonary specialist for evaluation and possible pulmonary function tests. - Consider high-resolution CT scan if recommended by specialist.  Ear Congestion with Fluid/ETD Dull tympanic membrane with fluid, no infection. Suspected Eustachian tube dysfunction related to nasal congestion. - Prescribe nasal spray (Flonase  or Nasonex ) to relieve congestion.  Fatigue Persistent fatigue post-illness, recovery from pneumonia and previous COVID-19 history. - Consider further evaluation if fatigue persists.  Weight Gain 12-pound weight gain over two months, attributed to medication and reduced activity due to fatigue.  Follow-up Follow-up scheduled, considering pulmonary specialist for respiratory symptoms. - Follow up on July 29 for physical examination. - Discuss pulmonary specialist options and schedule appointment if desired.    The 10-year ASCVD risk score (Arnett DK, et al., 2019) is: 6.4%  Past Medical History:  Diagnosis Date   DDD (degenerative disc disease), lumbar    Dysplastic nevus 02/24/2016   L chest - mild    Dysplastic nevus 08/11/2021   Left xyphoid - moderate   Hypertension    Low back pain    Migraine    Obesity    Post concussion syndrome    History reviewed. No pertinent surgical history. Social History   Tobacco Use   Smoking status: Never   Smokeless tobacco: Never  Vaping Use   Vaping status:  Never Used  Substance Use Topics   Alcohol  use: Yes    Comment: Rare   Drug use: No   Family History  Problem Relation Age of Onset   Diabetes Father    Heart disease Father    Hyperlipidemia Father    Hypertension Father    Allergies  Allergen Reactions   Amoxicillin -Pot Clavulanate Nausea And Vomiting and Other (See Comments)    Did not feel right, had it recently    ROS    Objective:    BP (!) 142/88   Pulse 81   Temp 98.1 F (36.7 C)   Ht 5' 11 (1.803 m)   Wt 278 lb (126.1 kg)   SpO2 98%   BMI 38.77 kg/m  BP Readings from Last 3 Encounters:  04/16/24 (!) 142/88  03/24/24 130/84  03/11/24 128/88   Wt Readings from Last 3 Encounters:  04/16/24 278 lb (126.1 kg)  03/24/24 271 lb (122.9 kg)  03/11/24 276 lb (125.2 kg)    Physical Exam Vitals and nursing note reviewed.  Constitutional:      Appearance: Normal appearance.  HENT:     Head: Normocephalic.     Right Ear: Tympanic membrane, ear canal and external ear normal.     Left Ear: Tympanic membrane, ear canal and external ear normal.  Eyes:     Extraocular Movements: Extraocular movements  intact.     Conjunctiva/sclera: Conjunctivae normal.     Pupils: Pupils are equal, round, and reactive to light.  Cardiovascular:     Rate and Rhythm: Normal rate and regular rhythm.     Heart sounds: Normal heart sounds.  Pulmonary:     Effort: Pulmonary effort is normal.     Breath sounds: Normal breath sounds. No wheezing or rhonchi.  Musculoskeletal:     Right lower leg: No edema.     Left lower leg: No edema.  Neurological:     General: No focal deficit present.     Mental Status: He is alert and oriented to person, place, and time.  Psychiatric:        Mood and Affect: Mood normal.        Behavior: Behavior normal.        Thought Content: Thought content normal.        Judgment: Judgment normal.      No results found for any visits on 04/16/24.  {Labs (Optional):23779}

## 2024-04-22 ENCOUNTER — Other Ambulatory Visit

## 2024-04-22 DIAGNOSIS — E669 Obesity, unspecified: Secondary | ICD-10-CM

## 2024-04-22 DIAGNOSIS — Z125 Encounter for screening for malignant neoplasm of prostate: Secondary | ICD-10-CM

## 2024-04-22 DIAGNOSIS — I10 Essential (primary) hypertension: Secondary | ICD-10-CM

## 2024-04-22 DIAGNOSIS — Z13220 Encounter for screening for lipoid disorders: Secondary | ICD-10-CM

## 2024-04-22 LAB — LIPID PANEL
Cholesterol: 151 mg/dL (ref <200)
HDL: 53 mg/dL (ref >=40)
LDL Cholesterol (Calc): 79 mg/dL
Non-HDL Cholesterol (Calc): 98 mg/dL (ref <130)
Total CHOL/HDL Ratio: 2.8 (calc) (ref <5.0)
Triglycerides: 105 mg/dL (ref <150)

## 2024-04-22 LAB — COMPLETE METABOLIC PANEL WITHOUT GFR
AG Ratio: 1.9 (calc) (ref 1.0–2.5)
ALT: 16 U/L (ref 9–46)
AST: 18 U/L (ref 10–35)
Albumin: 4.1 g/dL (ref 3.6–5.1)
Alkaline phosphatase (APISO): 76 U/L (ref 35–144)
BUN: 16 mg/dL (ref 7–25)
CO2: 28 mmol/L (ref 20–32)
Calcium: 9.4 mg/dL (ref 8.6–10.3)
Chloride: 103 mmol/L (ref 98–110)
Creat: 1.1 mg/dL (ref 0.70–1.30)
Globulin: 2.2 g/dL (ref 1.9–3.7)
Glucose, Bld: 94 mg/dL (ref 65–99)
Potassium: 4.2 mmol/L (ref 3.5–5.3)
Sodium: 137 mmol/L (ref 135–146)
Total Bilirubin: 0.3 mg/dL (ref 0.2–1.2)
Total Protein: 6.3 g/dL (ref 6.1–8.1)

## 2024-04-22 LAB — CBC WITH DIFFERENTIAL/PLATELET
Absolute Lymphocytes: 2131 {cells}/uL (ref 850–3900)
Absolute Monocytes: 718 {cells}/uL (ref 200–950)
Basophils Absolute: 59 {cells}/uL (ref 0–200)
Basophils Relative: 0.8 %
Eosinophils Absolute: 303 {cells}/uL (ref 15–500)
Eosinophils Relative: 4.1 %
HCT: 41.6 % (ref 38.5–50.0)
Hemoglobin: 13.7 g/dL (ref 13.2–17.1)
MCH: 29.6 pg (ref 27.0–33.0)
MCHC: 32.9 g/dL (ref 32.0–36.0)
MCV: 89.8 fL (ref 80.0–100.0)
MPV: 10.4 fL (ref 7.5–12.5)
Monocytes Relative: 9.7 %
Neutro Abs: 4188 {cells}/uL (ref 1500–7800)
Neutrophils Relative %: 56.6 %
Platelets: 271 Thousand/uL (ref 140–400)
RBC: 4.63 Million/uL (ref 4.20–5.80)
RDW: 12.8 % (ref 11.0–15.0)
Total Lymphocyte: 28.8 %
WBC: 7.4 Thousand/uL (ref 3.8–10.8)

## 2024-04-22 LAB — PSA: PSA: 0.86 ng/mL (ref <=4.00)

## 2024-04-23 ENCOUNTER — Ambulatory Visit: Payer: Self-pay | Admitting: Family Medicine

## 2024-04-29 ENCOUNTER — Encounter: Payer: Self-pay | Admitting: Family Medicine

## 2024-04-29 ENCOUNTER — Ambulatory Visit: Admitting: Family Medicine

## 2024-04-29 VITALS — BP 144/86 | HR 86 | Temp 98.7°F | Ht 71.0 in | Wt 278.0 lb

## 2024-04-29 DIAGNOSIS — F419 Anxiety disorder, unspecified: Secondary | ICD-10-CM | POA: Diagnosis not present

## 2024-04-29 DIAGNOSIS — R053 Chronic cough: Secondary | ICD-10-CM

## 2024-04-29 DIAGNOSIS — Z0001 Encounter for general adult medical examination with abnormal findings: Secondary | ICD-10-CM | POA: Diagnosis not present

## 2024-04-29 DIAGNOSIS — Z0000 Encounter for general adult medical examination without abnormal findings: Secondary | ICD-10-CM

## 2024-04-29 MED ORDER — BUSPIRONE HCL 7.5 MG PO TABS: 7.5000 mg | ORAL_TABLET | Freq: Two times a day (BID) | ORAL | 3 refills | Status: DC

## 2024-04-29 MED ORDER — PANTOPRAZOLE SODIUM 40 MG PO TBEC: 40.0000 mg | DELAYED_RELEASE_TABLET | Freq: Every day | ORAL | 3 refills | Status: DC

## 2024-04-29 MED ORDER — TRAMADOL HCL 50 MG PO TABS: 50.0000 mg | ORAL_TABLET | Freq: Three times a day (TID) | ORAL | 0 refills | Status: AC | PRN

## 2024-04-29 NOTE — Progress Notes (Signed)
 Subjective:    Patient ID: Darren Baker, male    DOB: 1970/11/17, 53 y.o.   MRN: [id number]  HPI Patient is here today for a complete physical exam.  Cologuard was negative in 10/23.  Due again in 2026.  PSA is WNL.   Patient originally became sick in May after he was exposed to virus concerning.  He was treated with a Z-Pak.  He was then treated with albuterol  and prednisone .  Chest x-ray showed possible left upper lobe pneumonia and he was treated with Levaquin .  He was also given doxycycline .  He continues to have a cough now more than 6 weeks after his original infection however he has no fever, shortness of breath, chest pain.  There is a tickling cough like sensation in his throat that will not go away.  He denies any sinus drainage.  He denies any hemoptysis.  He denies any sinus pain.  He denies any reflux.  Most recent lab work is listed below Lab on 04/22/2024  Component Date Value Ref Range Status   WBC 04/22/2024 7.4  3.8 - 10.8 Thousand/uL Final   RBC 04/22/2024 4.63  4.20 - 5.80 Million/uL Final   Hemoglobin 04/22/2024 13.7  13.2 - 17.1 g/dL Final   HCT 92/77/7974 41.6  38.5 - 50.0 % Final   MCV 04/22/2024 89.8  80.0 - 100.0 fL Final   MCH 04/22/2024 29.6  27.0 - 33.0 pg Final   MCHC 04/22/2024 32.9  32.0 - 36.0 g/dL Final   Comment: For adults, a slight decrease in the calculated MCHC value (in the range of 30 to 32 g/dL) is most likely not clinically significant; however, it should be interpreted with caution in correlation with other red cell parameters and the patient's clinical condition.    RDW 04/22/2024 12.8  11.0 - 15.0 % Final   Platelets 04/22/2024 271  140 - 400 Thousand/uL Final   MPV 04/22/2024 10.4  7.5 - 12.5 fL Final   Neutro Abs 04/22/2024 4,188  1,500 - 7,800 cells/uL Final   Absolute Lymphocytes 04/22/2024 2,131  850 - 3,900 cells/uL Final   Absolute Monocytes 04/22/2024 718  200 - 950 cells/uL Final   Eosinophils Absolute 04/22/2024 303  15 - 500  cells/uL Final   Basophils Absolute 04/22/2024 59  0 - 200 cells/uL Final   Neutrophils Relative % 04/22/2024 56.6  % Final   Total Lymphocyte 04/22/2024 28.8  % Final   Monocytes Relative 04/22/2024 9.7  % Final   Eosinophils Relative 04/22/2024 4.1  % Final   Basophils Relative 04/22/2024 0.8  % Final   Glucose, Bld 04/22/2024 94  65 - 99 mg/dL Final   Comment: .            Fasting reference interval .    BUN 04/22/2024 16  7 - 25 mg/dL Final   Creat 92/77/7974 1.10  0.70 - 1.30 mg/dL Final   BUN/Creatinine Ratio 04/22/2024 SEE NOTE:  6 - 22 (calc) Final   Comment:    Not Reported: BUN and Creatinine are within    reference range. .    Sodium 04/22/2024 137  135 - 146 mmol/L Final   Potassium 04/22/2024 4.2  3.5 - 5.3 mmol/L Final   Chloride 04/22/2024 103  98 - 110 mmol/L Final   CO2 04/22/2024 28  20 - 32 mmol/L Final   Calcium 04/22/2024 9.4  8.6 - 10.3 mg/dL Final   Total Protein 92/77/7974 6.3  6.1 - 8.1 g/dL Final  Albumin 04/22/2024 4.1  3.6 - 5.1 g/dL Final   Globulin 92/77/7974 2.2  1.9 - 3.7 g/dL (calc) Final   AG Ratio 04/22/2024 1.9  1.0 - 2.5 (calc) Final   Total Bilirubin 04/22/2024 0.3  0.2 - 1.2 mg/dL Final   Alkaline phosphatase (APISO) 04/22/2024 76  35 - 144 U/L Final   AST 04/22/2024 18  10 - 35 U/L Final   ALT 04/22/2024 16  9 - 46 U/L Final   Cholesterol 04/22/2024 151  <200 mg/dL Final   HDL 92/77/7974 53  > OR = 40 mg/dL Final   Triglycerides 92/77/7974 105  <150 mg/dL Final   LDL Cholesterol (Calc) 04/22/2024 79  mg/dL (calc) Final   Comment: Reference range: <100 . Desirable range <100 mg/dL for primary prevention;   <70 mg/dL for patients with CHD or diabetic patients  with > or = 2 CHD risk factors. SABRA LDL-C is now calculated using the Martin-Hopkins  calculation, which is a validated novel method providing  better accuracy than the Friedewald equation in the  estimation of LDL-C.  Gladis APPLETHWAITE et al. SANDREA. 7986;689(80): 2061-2068   (http://education.[facility].com/faq/FAQ164)    Total CHOL/HDL Ratio 04/22/2024 2.8  <4.9 (calc) Final   Non-HDL Cholesterol (Calc) 04/22/2024 98  <130 mg/dL (calc) Final   Comment: For patients with diabetes plus 1 major ASCVD risk  factor, treating to a non-HDL-C goal of <100 mg/dL  (LDL-C of <29 mg/dL) is considered a therapeutic  option.    PSA 04/22/2024 0.86  < OR = 4.00 ng/mL Final   Comment: The total PSA value from this assay system is  standardized against the WHO standard. The test  result will be approximately 20% lower when compared  to the equimolar-standardized total PSA (Beckman  Coulter). Comparison of serial PSA results should be  interpreted with this fact in mind. . This test was performed using the Siemens  chemiluminescent method. Values obtained from  different assay methods cannot be used interchangeably. PSA levels, regardless of value, should not be interpreted as absolute evidence of the presence or absence of disease.    Immunization History  Administered Date(s) Administered   Hep A / Hep B 05/16/2013   Hepatitis A 06/21/2012, 07/19/2012   Hepatitis A, Adult 06/21/2012, 07/19/2012   Hepatitis B 06/21/2012, 07/19/2012, 05/16/2013   Hepatitis B, ADULT 06/21/2012, 07/19/2012, 05/16/2013   Influenza Inj Mdck Quad Pf 06/15/2018   Influenza Inj Mdck Quad With Preservative 06/15/2018   Influenza, High Dose Seasonal PF 10/29/2016   Influenza, Mdck, Trivalent,PF 6+ MOS(egg free) 08/10/2023   Influenza,inj,Quad PF,6+ Mos 07/04/2013, 08/06/2014, 07/16/2015, 06/10/2017, 06/15/2018, 06/20/2019, 07/25/2021   Influenza,inj,Quad PF,6-35 Mos 06/20/2019   Td 05/04/2009   Tdap 05/04/2009, 12/31/2020   Zoster Recombinant(Shingrix ) 04/19/2023, 08/14/2023     Past Medical History:  Diagnosis Date   DDD (degenerative disc disease), lumbar    Dysplastic nevus 02/24/2016   L chest - mild    Dysplastic nevus 08/11/2021   Left xyphoid - moderate    Hypertension    Low back pain    Migraine    Obesity    Post concussion syndrome    Current Outpatient Medications on File Prior to Visit  Medication Sig Dispense Refill   acetaminophen  (TYLENOL ) 500 MG tablet Take 1,000 mg by mouth every 6 (six) hours as needed for moderate pain.     albuterol  (VENTOLIN  HFA) 108 (90 Base) MCG/ACT inhaler INHALE 1-2 PUFFS BY MOUTH EVERY 6 HOURS AS NEEDED FOR WHEEZE OR SHORTNESS OF  BREATH 18 each 1   ALPRAZolam  (XANAX ) 0.5 MG tablet TAKE 1 TABLET (0.5 MG TOTAL) BY MOUTH 3 (THREE) TIMES DAILY AS NEEDED FOR ANXIETY. 90 tablet 0   benzonatate  (TESSALON  PERLES) 100 MG capsule Take 1 capsule (100 mg total) by mouth 3 (three) times daily as needed. 30 capsule 0   chlorpheniramine-HYDROcodone  (TUSSIONEX) 10-8 MG/5ML Take 5 mLs by mouth every 12 (twelve) hours as needed for cough. 120 mL 0   eletriptan (RELPAX) 40 MG tablet Take 40 mg by mouth daily as needed for migraine. One tablet by mouth at onset of headache. May repeat in 2 hours if headache persists or recurs.     fluticasone  (FLONASE ) 50 MCG/ACT nasal spray Place 2 sprays into both nostrils daily. 1 g 11   HYDROcodone -acetaminophen  (NORCO/VICODIN) 5-325 MG tablet Take 1 tablet by mouth every 6 (six) hours as needed for moderate pain. 90 tablet 0   linaclotide  (LINZESS ) 145 MCG CAPS capsule Take 1 capsule (145 mcg total) by mouth daily before breakfast. 90 capsule 3   losartan -hydrochlorothiazide (HYZAAR) 50-12.5 MG tablet TAKE 1 TABLET BY MOUTH EVERY DAY 90 tablet 0   meclizine  (ANTIVERT ) 25 MG tablet Take 1 tablet (25 mg total) by mouth 3 (three) times daily as needed. 30 tablet 0   Roflumilast  (ZORYVE ) 0.3 % CREA Apply 1 application  topically daily. qd to aa rash scalp, and groin prn flares 60 g 11   sildenafil  (VIAGRA ) 100 MG tablet Take 0.5-1 tablets (50-100 mg total) by mouth daily as needed for erectile dysfunction. 5 tablet 11   tiZANidine (ZANAFLEX) 4 MG tablet Take 4-8 mg by mouth at bedtime as needed.      valACYclovir  (VALTREX ) 1000 MG tablet Take 1 tablet (1,000 mg total) by mouth as directed. Take 2 po with first symptoms of fever blister then 2 po 12 hours later 30 tablet 11   verapamil (CALAN-SR) 240 MG CR tablet Take 240 mg by mouth daily.  3   No current facility-administered medications on file prior to visit.   Allergies  Allergen Reactions   Amoxicillin -Pot Clavulanate Nausea And Vomiting and Other (See Comments)    Did not feel right, had it recently   Social History   Socioeconomic History   Marital status: Married    Spouse name: Not on file   Number of children: Not on file   Years of education: Not on file   Highest education level: Not on file  Occupational History   Not on file  Tobacco Use   Smoking status: Never   Smokeless tobacco: Never  Vaping Use   Vaping status: Never Used  Substance and Sexual Activity   Alcohol  use: Yes    Comment: Rare   Drug use: No   Sexual activity: Not on file    Comment: Married, 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  Other Topics Concern   Not on file  Social History Narrative   Not on file   Social Drivers of Health   Financial Resource Strain: Low Risk  (02/21/2023)   Received from [facility]   Overall Financial Resource Strain (CARDIA)    Difficulty of Paying Living Expenses: Not very hard  Food Insecurity: No Food Insecurity (02/21/2023)   Received from [facility]   Hunger Vital Sign    Within the past 12 months, you worried that your food would run out before you got the money to buy more.: Never true    Within the past 12 months, the food you bought  just didn't last and you didn't have money to get more.: Never true  Transportation Needs: No Transportation Needs (02/21/2023)   Received from [facility]   PRAPARE - Transportation    Lack of Transportation (Medical): No    Lack of Transportation (Non-Medical): No  Physical Activity: Unknown (10/22/2022)   Received from [facility]   Exercise Vital Sign    On  average, how many days per week do you engage in moderate to strenuous exercise (like a brisk walk)?: Patient declined    Minutes of Exercise per Session: Not on file  Stress: Patient Declined (10/22/2022)   Received from [facility] - Occupational Stress Questionnaire    Feeling of Stress : Patient declined  Social Connections: Unknown (02/02/2022)   Received from [facility]   Social Network    Social Network: Not on file  Intimate Partner Violence: Not At Risk (02/05/2024)   Received from [facility]   HITS    Over the last 12 months how often did your partner physically hurt you?: Never    Over the last 12 months how often did your partner insult you or talk down to you?: Never    Over the last 12 months how often did your partner threaten you with physical harm?: Never    Over the last 12 months how often did your partner scream or curse at you?: Never   Family History  Problem Relation Age of Onset   Diabetes Father    Heart disease Father    Hyperlipidemia Father    Hypertension Father      Review of Systems  All other systems reviewed and are negative.      Objective:   Physical Exam Vitals reviewed.  Constitutional:      General: He is not in acute distress.    Appearance: He is well-developed. He is not diaphoretic.  HENT:     Head: Normocephalic and atraumatic.     Right Ear: External ear normal.     Left Ear: External ear normal.     Nose: Nose normal.     Mouth/Throat:     Pharynx: No oropharyngeal exudate.  Eyes:     General: No scleral icterus.       Right eye: No discharge.        Left eye: No discharge.     Conjunctiva/sclera: Conjunctivae normal.     Pupils: Pupils are equal, round, and reactive to light.  Neck:     Thyroid: No thyromegaly.     Vascular: No JVD.     Trachea: No tracheal deviation.  Cardiovascular:     Rate and Rhythm: Normal rate and regular rhythm.     Heart sounds: Normal heart  sounds. No murmur heard.    No friction rub. No gallop.  Pulmonary:     Effort: Pulmonary effort is normal. No respiratory distress.     Breath sounds: Normal breath sounds. No stridor. No wheezing or rales.  Chest:     Chest wall: No tenderness.  Abdominal:     General: Bowel sounds are normal. There is no distension.     Palpations: Abdomen is soft. There is no mass.     Tenderness: There is no abdominal tenderness. There is no guarding or rebound.     Hernia: No hernia is present.  Genitourinary:    Penis: Normal.      Rectum: Normal.  Musculoskeletal:  General: No tenderness or deformity.     Cervical back: Normal range of motion and neck supple.  Lymphadenopathy:     Cervical: No cervical adenopathy.  Skin:    General: Skin is warm.     Coloration: Skin is not pale.     Findings: No rash.  Neurological:     Mental Status: He is alert and oriented to person, place, and time.     Cranial Nerves: No cranial nerve deficit.     Sensory: No sensory deficit.     Motor: No abnormal muscle tone.     Coordination: Coordination normal.           Assessment & Plan:  General medical exam  Chronic cough  Anxiety Patient's exam today is normal.  I believe that he has upper airway cough syndrome possibly triggered by laryngal esophageal reflux.  I recommended starting pantoprazole  40 mg daily, elevating head of the bed 2 to 3 inches, and using tramadol  50 mg every 8 hours to suppress the cough.  Recheck in 2 weeks.  Add BuSpar  7 point milligram nightly.  Micro lab work is normal.  PSA is up-to-date.  Cologuard is up-to-date.

## 2024-05-02 ENCOUNTER — Other Ambulatory Visit: Payer: Self-pay | Admitting: Family Medicine

## 2024-05-02 DIAGNOSIS — R053 Chronic cough: Secondary | ICD-10-CM

## 2024-05-05 ENCOUNTER — Other Ambulatory Visit: Payer: Self-pay | Admitting: Family Medicine

## 2024-05-05 DIAGNOSIS — J209 Acute bronchitis, unspecified: Secondary | ICD-10-CM

## 2024-05-19 ENCOUNTER — Other Ambulatory Visit: Payer: Self-pay | Admitting: Family Medicine

## 2024-05-19 DIAGNOSIS — I10 Essential (primary) hypertension: Secondary | ICD-10-CM

## 2024-05-19 NOTE — Telephone Encounter (Signed)
 Prescription Request  05/19/2024  LOV: 04/29/2024  What is the name of the medication or equipment?   losartan -hydrochlorothiazide (HYZAAR) 50-12.5 MG tablet  **90 day script requested**  Have you contacted your pharmacy to request a refill? Yes   Which pharmacy would you like this sent to?  [id number] [address], [place] - [address] [place] [address] Phone: [phone] Fax: [phone]    Patient notified that their request is being sent to the clinical staff for review and that they should receive a response within 2 business days.   Please advise pharmacist.

## 2024-05-21 ENCOUNTER — Other Ambulatory Visit: Payer: Self-pay | Admitting: Family Medicine

## 2024-05-21 MED ORDER — LOSARTAN POTASSIUM-HCTZ 50-12.5 MG PO TABS
1.0000 | ORAL_TABLET | Freq: Every day | ORAL | 0 refills | Status: DC
Start: 2024-05-21 — End: 2024-08-19

## 2024-05-21 NOTE — Telephone Encounter (Signed)
 Requested Prescriptions  Pending Prescriptions Disp Refills   losartan -hydrochlorothiazide (HYZAAR) 50-12.5 MG tablet 90 tablet 0    Sig: Take 1 tablet by mouth daily.     Cardiovascular: ARB + Diuretic Combos Failed - 05/21/2024 12:37 PM      Failed - eGFR is 10 or above and within 180 days    GFR, Est African American  Date Value Ref Range Status  12/31/2020 78 > OR = 60 mL/min/1.10m2 Final   GFR, Est Non African American  Date Value Ref Range Status  12/31/2020 67 > OR = 60 mL/min/1.45m2 Final   GFR, Estimated  Date Value Ref Range Status  05/25/2021 >60 >60 mL/min Final    Comment:    (NOTE) Calculated using the CKD-EPI Creatinine Equation (2021)    eGFR  Date Value Ref Range Status  04/11/2023 70 > OR = 60 mL/min/1.65m2 Final         Failed - Last BP in normal range    BP Readings from Last 1 Encounters:  04/29/24 (!) 144/86         Passed - K in normal range and within 180 days    Potassium  Date Value Ref Range Status  04/22/2024 4.2 3.5 - 5.3 mmol/L Final         Passed - Na in normal range and within 180 days    Sodium  Date Value Ref Range Status  04/22/2024 137 135 - 146 mmol/L Final         Passed - Cr in normal range and within 180 days    Creat  Date Value Ref Range Status  04/22/2024 1.10 0.70 - 1.30 mg/dL Final         Passed - Patient is not pregnant      Passed - Valid encounter within last 6 months    Recent Outpatient Visits           3 weeks ago General medical exam   [place] [facility] Duanne Butler DASEN, MD   1 month ago Pneumonia of left upper lobe due to infectious organism   [place] [facility] Aletha Bene, MD   1 month ago Pneumonia of left upper lobe due to infectious organism   [place] [facility] Aletha Bene, MD   2 months ago Acute bronchitis, unspecified organism    [facility] Aletha Bene, MD   2 months ago Acute  bronchitis, unspecified organism    [facility] Aletha Bene, MD       Future Appointments             In 1 month Hester Alm BROCKS, MD [facility]

## 2024-06-04 ENCOUNTER — Other Ambulatory Visit: Payer: Self-pay | Admitting: Family Medicine

## 2024-07-03 ENCOUNTER — Encounter: Payer: Self-pay | Admitting: Pulmonary Disease

## 2024-07-03 ENCOUNTER — Ambulatory Visit: Admitting: Pulmonary Disease

## 2024-07-03 ENCOUNTER — Other Ambulatory Visit: Payer: Self-pay | Admitting: Pulmonary Disease

## 2024-07-03 VITALS — BP 132/86 | HR 76 | Ht 71.0 in | Wt 286.0 lb

## 2024-07-03 DIAGNOSIS — J4520 Mild intermittent asthma, uncomplicated: Secondary | ICD-10-CM

## 2024-07-03 DIAGNOSIS — R059 Cough, unspecified: Secondary | ICD-10-CM

## 2024-07-03 DIAGNOSIS — R918 Other nonspecific abnormal finding of lung field: Secondary | ICD-10-CM

## 2024-07-03 DIAGNOSIS — K219 Gastro-esophageal reflux disease without esophagitis: Secondary | ICD-10-CM

## 2024-07-03 MED ORDER — FLUTICASONE-SALMETEROL 250-50 MCG/ACT IN AEPB: 1.0000 | INHALATION_SPRAY | Freq: Two times a day (BID) | RESPIRATORY_TRACT | 5 refills | Status: AC

## 2024-07-03 MED ORDER — FLUTICASONE-SALMETEROL 115-21 MCG/ACT IN AERO: 2.0000 | INHALATION_SPRAY | Freq: Two times a day (BID) | RESPIRATORY_TRACT | 12 refills | Status: AC

## 2024-07-03 NOTE — Patient Instructions (Addendum)
 For your cough and wheezing:  - start pantoprazole  40mg  daily for reflux - start advair inhaler 2 puffs twice daily as needed  We will schedule you for high resolution CT Chest scan in December to monitor the slight peripheral changes on your past lung scan  Follow up in December after CT Chest scan

## 2024-07-03 NOTE — Progress Notes (Unsigned)
 Subjective:   PATIENT ID: Darren Baker GENDER: male DOB: [birth year], MRN: [id number]   HPI Discussed the use of AI scribe software for clinical note transcription with the patient, who gave verbal consent to proceed.  History of Present Illness   Darren Baker is a 53 year old male who presents with persistent cough and respiratory symptoms following a suspected pneumonia.   He began experiencing a sore throat, congestion, and coughing in May. He had prolonged course of symptoms with cough productive with mucous, wheezing and shortness of breath. He was treated with a Zpak 7-10 days after symptoms onset without much improvement. He was later treated with doxycycline  and steroids with some improvement but return of symptoms once treatment completed. He then completed a course of levaquin . He continued to have symptoms of cough. He was placed on a PPI and tramadol  for the cough with resolution of his symptoms over recent weeks.   Chest X-ray 03/18/24 reviewed with small focal opacities of LUL. CT Chest Completed 04/10/24 with note of small triangular nodule along the right major fissure measuring 3mm, which is stable from CT 06/02/22. Upon personal review, there is mild subpleural reticulation in the dependent lung areas possible interstitial lung abnormality vs atelectasis.  He still experiences occasional wheezing and a sensation that could trigger a cough. He reports occasional shortness of breath, particularly with exertion, and has gained weight since the onset of his symptoms. He was previously prescribed albuterol  and fluticasone  nasal spray, but he does not use them frequently.     His grandfather died from pulmonary fibrosis. He is retired from [employer] examiner. He is a never smoker and no significant smoking history. [place] Recently cleaned out barns on the property exposed to dust, rodent feces etc.   Past Medical History:  Diagnosis Date   DDD (degenerative disc  disease), lumbar    Dysplastic nevus 02/24/2016   L chest - mild    Dysplastic nevus 08/11/2021   Left xyphoid - moderate   Hypertension    Low back pain    Migraine    Obesity    Post concussion syndrome      Family History  Problem Relation Age of Onset   Diabetes Father    Heart disease Father    Hyperlipidemia Father    Hypertension Father      Social History   Socioeconomic History   Marital status: Married    Spouse name: Not on file   Number of children: Not on file   Years of education: Not on file   Highest education level: Not on file  Occupational History   Not on file  Tobacco Use   Smoking status: Never   Smokeless tobacco: Never  Vaping Use   Vaping status: Never Used  Substance and Sexual Activity   Alcohol  use: Yes    Comment: Rare   Drug use: No   Sexual activity: Not on file    Comment: Married, 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  Other Topics Concern   Not on file  Social History Narrative   Not on file   Social Drivers of Health   Financial Resource Strain: Low Risk  (02/21/2023)   Received from [facility]   Overall Financial Resource Strain (CARDIA)    Difficulty of Paying Living Expenses: Not very hard  Food Insecurity: No Food Insecurity (06/09/2024)   Received from [facility]   Hunger Vital Sign    Within the past  12 months, you worried that your food would run out before you got the money to buy more.: Never true    Within the past 12 months, the food you bought just didn't last and you didn't have money to get more.: Never true  Transportation Needs: No Transportation Needs (06/09/2024)   Received from [facility] - Transportation    In the past 12 months, has lack of transportation kept you from medical appointments or from getting medications?: No    In the past 12 months, has lack of transportation kept you from meetings, work, or from getting things needed for daily living?: No  Physical Activity: Unknown (10/22/2022)    Received from [facility]   Exercise Vital Sign    On average, how many days per week do you engage in moderate to strenuous exercise (like a brisk walk)?: Patient declined    Minutes of Exercise per Session: Not on file  Stress: Patient Declined (10/22/2022)   Received from [facility] - Occupational Stress Questionnaire    Feeling of Stress : Patient declined  Social Connections: Unknown (02/02/2022)   Received from [facility]   Social Network    Social Network: Not on file  Intimate Partner Violence: Not At Risk (02/05/2024)   Received from [facility]   HITS    Over the last 12 months how often did your partner physically hurt you?: Never    Over the last 12 months how often did your partner insult you or talk down to you?: Never    Over the last 12 months how often did your partner threaten you with physical harm?: Never    Over the last 12 months how often did your partner scream or curse at you?: Never     Allergies  Allergen Reactions   Amoxicillin -Pot Clavulanate Nausea And Vomiting and Other (See Comments)    Did not feel right, had it recently     Outpatient Medications Prior to Visit  Medication Sig Dispense Refill   acetaminophen  (TYLENOL ) 500 MG tablet Take 1,000 mg by mouth every 6 (six) hours as needed for moderate pain.     albuterol  (VENTOLIN  HFA) 108 (90 Base) MCG/ACT inhaler INHALE 1-2 PUFFS BY MOUTH EVERY 6 HOURS AS NEEDED FOR WHEEZE OR SHORTNESS OF BREATH 18 each 1   ALPRAZolam  (XANAX ) 0.5 MG tablet TAKE 1 TABLET (0.5 MG TOTAL) BY MOUTH 3 (THREE) TIMES DAILY AS NEEDED FOR ANXIETY. 90 tablet 0   benzonatate  (TESSALON  PERLES) 100 MG capsule Take 1 capsule (100 mg total) by mouth 3 (three) times daily as needed. 30 capsule 0   busPIRone  (BUSPAR ) 7.5 MG tablet Take 1 tablet (7.5 mg total) by mouth 2 (two) times daily. 60 tablet 3   chlorpheniramine-HYDROcodone  (TUSSIONEX) 10-8 MG/5ML Take 5 mLs by mouth every 12  (twelve) hours as needed for cough. 120 mL 0   eletriptan (RELPAX) 40 MG tablet Take 40 mg by mouth daily as needed for migraine. One tablet by mouth at onset of headache. May repeat in 2 hours if headache persists or recurs.     fluticasone  (FLONASE ) 50 MCG/ACT nasal spray Place 2 sprays into both nostrils daily. 1 g 11   HYDROcodone -acetaminophen  (NORCO/VICODIN) 5-325 MG tablet Take 1 tablet by mouth every 6 (six) hours as needed for moderate pain. 90 tablet 0   linaclotide  (LINZESS ) 145 MCG CAPS capsule Take 1 capsule (145 mcg total) by mouth daily before breakfast. 90  capsule 3   losartan -hydrochlorothiazide (HYZAAR) 50-12.5 MG tablet Take 1 tablet by mouth daily. 90 tablet 0   meclizine  (ANTIVERT ) 25 MG tablet Take 1 tablet (25 mg total) by mouth 3 (three) times daily as needed. 30 tablet 0   pantoprazole  (PROTONIX ) 40 MG tablet Take 1 tablet (40 mg total) by mouth daily. 30 tablet 3   Roflumilast  (ZORYVE ) 0.3 % CREA Apply 1 application  topically daily. qd to aa rash scalp, and groin prn flares 60 g 11   sildenafil  (VIAGRA ) 100 MG tablet Take 0.5-1 tablets (50-100 mg total) by mouth daily as needed for erectile dysfunction. 5 tablet 11   tiZANidine (ZANAFLEX) 4 MG tablet Take 4-8 mg by mouth at bedtime as needed.     valACYclovir  (VALTREX ) 1000 MG tablet Take 1 tablet (1,000 mg total) by mouth as directed. Take 2 po with first symptoms of fever blister then 2 po 12 hours later 30 tablet 11   verapamil (CALAN-SR) 240 MG CR tablet Take 240 mg by mouth daily.  3   No facility-administered medications prior to visit.   Review of Systems  Constitutional:  Negative for chills, fever, malaise/fatigue and weight loss.  HENT:  Negative for congestion, sinus pain and sore throat.   Eyes: Negative.   Respiratory:  Positive for cough, shortness of breath and wheezing. Negative for hemoptysis and sputum production.   Cardiovascular:  Negative for chest pain, palpitations, orthopnea, claudication and  leg swelling.  Gastrointestinal:  Positive for heartburn. Negative for abdominal pain, nausea and vomiting.  Genitourinary: Negative.   Musculoskeletal:  Negative for joint pain and myalgias.  Skin:  Negative for rash.  Neurological:  Negative for weakness.  Endo/Heme/Allergies: Negative.   Psychiatric/Behavioral: Negative.      Objective:   Vitals:   07/03/24 0947  BP: 132/86  Pulse: 76  SpO2: 99%  Weight: 286 lb (129.7 kg)  Height: 5' 11 (1.803 m)   Physical Exam Constitutional:      General: He is not in acute distress.    Appearance: Normal appearance.  Eyes:     General: No scleral icterus.    Conjunctiva/sclera: Conjunctivae normal.  Cardiovascular:     Rate and Rhythm: Normal rate and regular rhythm.  Pulmonary:     Breath sounds: No wheezing, rhonchi or rales.  Musculoskeletal:     Right lower leg: No edema.     Left lower leg: No edema.  Skin:    General: Skin is warm and dry.  Neurological:     General: No focal deficit present.    CBC    Component Value Date/Time   WBC 7.4 04/22/2024 0805   RBC 4.63 04/22/2024 0805   HGB 13.7 04/22/2024 0805   HCT 41.6 04/22/2024 0805   PLT 271 04/22/2024 0805   MCV 89.8 04/22/2024 0805   MCH 29.6 04/22/2024 0805   MCHC 32.9 04/22/2024 0805   RDW 12.8 04/22/2024 0805   LYMPHSABS 1,643 04/11/2023 0913   MONOABS 952 (H) 02/08/2017 1239   EOSABS 303 04/22/2024 0805   BASOSABS 59 04/22/2024 0805      Latest Ref Rng & Units 04/22/2024    8:05 AM 04/11/2023    9:13 AM 04/27/2022   11:23 AM  BMP  Glucose 65 - 99 mg/dL 94  98  90   BUN 7 - 25 mg/dL 16  19  21    Creatinine 0.70 - 1.30 mg/dL 8.89  8.76  8.73   BUN/Creat Ratio 6 - 22 (calc) SEE  NOTE:  SEE NOTE:  NOT APPLICABLE   Sodium 135 - 146 mmol/L 137  137  140   Potassium 3.5 - 5.3 mmol/L 4.2  4.0  4.6   Chloride 98 - 110 mmol/L 103  104  105   CO2 20 - 32 mmol/L 28  24  26    Calcium 8.6 - 10.3 mg/dL 9.4  9.5  89.8     Chest imaging: CT Chest 04/10/24 1.  No discrete finding to correlate with the abnormality seen on prior radiograph. 2. Small triangular pulmonary nodules along the right major fissure measuring 3 mm are stable from CT June 02, 2022, likely reflecting intrapulmonary lymph nodes. 3. Bilateral gynecomastia.  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 Symptoms improved with tramadol  and PPI therapy. Other differentials include organizing pneumonia or early ILD.  - start wixella 250-50mcg 1 puff twice daily as needed - Advise use of inhaler scheduled during colds or infections. - Use inhaler before exposure to allergens or dust.  Interstitial Lung Abnormality Mild pulmonary scarring in lower lung zones, possibly due to past infections or environmental exposures. Discussed compressive atelectasis due to body position during CT scan. No significant concerns for hypersensitivity pneumonitis. Family history of lung disease noted, but current findings not concerning for pulmonary fibrosis. - Order high-resolution CT scan in December, prone positioning requested  Gastroesophageal reflux disease (GERD) Intermittent GERD symptoms, including heartburn and reflux, may contribute to respiratory symptoms. Symptoms improved with stomach medication. - Continue pantoprazole  daily.  65 minutes spent on this visit in total.  Dorn Chill, MD [facility] Office:  [phone]   Current Outpatient Medications:    acetaminophen  (TYLENOL ) 500 MG tablet, Take 1,000 mg by mouth every 6 (six) hours as needed for moderate pain., Disp: , Rfl:    albuterol  (VENTOLIN  HFA) 108 (90 Base) MCG/ACT inhaler, INHALE 1-2 PUFFS BY MOUTH EVERY 6 HOURS AS NEEDED FOR WHEEZE OR SHORTNESS OF BREATH, Disp: 18 each, Rfl: 1   ALPRAZolam  (XANAX ) 0.5 MG tablet, TAKE 1 TABLET (0.5 MG TOTAL) BY MOUTH 3 (THREE) TIMES DAILY AS NEEDED FOR ANXIETY., Disp: 90 tablet, Rfl: 0   benzonatate  (TESSALON  PERLES) 100 MG capsule, Take 1 capsule (100 mg total) by mouth 3 (three) times daily as needed., Disp: 30 capsule, Rfl: 0   busPIRone  (BUSPAR ) 7.5 MG tablet, Take 1 tablet (7.5 mg total) by mouth 2 (two) times daily., Disp: 60 tablet, Rfl: 3   chlorpheniramine-HYDROcodone  (TUSSIONEX) 10-8 MG/5ML, Take 5 mLs by mouth every 12 (twelve) hours as needed for cough., Disp: 120 mL, Rfl: 0   eletriptan (RELPAX) 40 MG tablet, Take 40 mg by mouth daily as needed for migraine. One tablet by mouth at onset of headache. May repeat in 2 hours if headache persists or recurs., Disp: , Rfl:    fluticasone  (FLONASE ) 50 MCG/ACT nasal spray, Place 2 sprays into both nostrils daily., Disp: 1 g, Rfl: 11   fluticasone -salmeterol (ADVAIR HFA) 115-21 MCG/ACT inhaler, Inhale  2 puffs into the lungs 2 (two) times daily., Disp: 1 each, Rfl: 12   fluticasone -salmeterol (ADVAIR) 250-50 MCG/ACT AEPB, Inhale 1 puff into the lungs every 12 (twelve) hours., Disp: 60 each, Rfl: 5   HYDROcodone -acetaminophen  (NORCO/VICODIN) 5-325 MG tablet, Take 1 tablet by mouth every 6 (six) hours as needed for moderate pain., Disp: 90 tablet, Rfl: 0   linaclotide  (LINZESS ) 145 MCG CAPS capsule, Take 1 capsule (145 mcg total) by mouth daily before breakfast., Disp: 90 capsule, Rfl: 3   losartan -hydrochlorothiazide (HYZAAR) 50-12.5 MG tablet, Take 1 tablet by mouth daily., Disp: 90 tablet, Rfl: 0   meclizine  (ANTIVERT ) 25 MG tablet, Take 1 tablet (25  mg total) by mouth 3 (three) times daily as needed., Disp: 30 tablet, Rfl: 0   pantoprazole  (PROTONIX ) 40 MG tablet, Take 1 tablet (40 mg total) by mouth daily., Disp: 30 tablet, Rfl: 3   Roflumilast  (ZORYVE ) 0.3 % CREA, Apply 1 application  topically daily. qd to aa rash scalp, and groin prn flares, Disp: 60 g, Rfl: 11   sildenafil  (VIAGRA ) 100 MG tablet, Take 0.5-1 tablets (50-100 mg total) by mouth daily as needed for erectile dysfunction., Disp: 5 tablet, Rfl: 11   tiZANidine (ZANAFLEX) 4 MG tablet, Take 4-8 mg by mouth at bedtime as needed., Disp: , Rfl:    valACYclovir  (VALTREX ) 1000 MG tablet, Take 1 tablet (1,000 mg total) by mouth as directed. Take 2 po with first symptoms of fever blister then 2 po 12 hours later, Disp: 30 tablet, Rfl: 11   verapamil (CALAN-SR) 240 MG CR tablet, Take 240 mg by mouth daily., Disp: , Rfl: 3

## 2024-07-04 ENCOUNTER — Encounter: Payer: Self-pay | Admitting: Pulmonary Disease

## 2024-07-08 ENCOUNTER — Ambulatory Visit: Admitting: Dermatology

## 2024-07-08 DIAGNOSIS — R21 Rash and other nonspecific skin eruption: Secondary | ICD-10-CM

## 2024-07-08 DIAGNOSIS — L820 Inflamed seborrheic keratosis: Secondary | ICD-10-CM

## 2024-07-08 DIAGNOSIS — B353 Tinea pedis: Secondary | ICD-10-CM

## 2024-07-08 DIAGNOSIS — D489 Neoplasm of uncertain behavior, unspecified: Secondary | ICD-10-CM

## 2024-07-08 DIAGNOSIS — Z79899 Other long term (current) drug therapy: Secondary | ICD-10-CM

## 2024-07-08 DIAGNOSIS — L570 Actinic keratosis: Secondary | ICD-10-CM

## 2024-07-08 DIAGNOSIS — L409 Psoriasis, unspecified: Secondary | ICD-10-CM | POA: Diagnosis not present

## 2024-07-08 DIAGNOSIS — W908XXA Exposure to other nonionizing radiation, initial encounter: Secondary | ICD-10-CM

## 2024-07-08 DIAGNOSIS — L821 Other seborrheic keratosis: Secondary | ICD-10-CM | POA: Diagnosis not present

## 2024-07-08 DIAGNOSIS — Z7189 Other specified counseling: Secondary | ICD-10-CM

## 2024-07-08 DIAGNOSIS — L578 Other skin changes due to chronic exposure to nonionizing radiation: Secondary | ICD-10-CM

## 2024-07-08 MED ORDER — TRIAMCINOLONE ACETONIDE 0.1 % EX OINT: TOPICAL_OINTMENT | CUTANEOUS | 6 refills | Status: AC

## 2024-07-08 NOTE — Patient Instructions (Addendum)
 Biopsy Wound Care Instructions  Leave the original bandage on for 24 hours if possible.  If the bandage becomes soaked or soiled before that time, it is OK to remove it and examine the wound.  A small amount of post-operative bleeding is normal.  If excessive bleeding occurs, remove the bandage, place gauze over the site and apply continuous pressure (no peeking) over the area for 30 minutes. If this does not work, please call our clinic as soon as possible or page your doctor if it is after hours.   Once a day, cleanse the wound with soap and water. It is fine to shower. If a thick crust develops you may use a Q-tip dipped into dilute hydrogen peroxide (mix 1:1 with water) to dissolve it.  Hydrogen peroxide can slow the healing process, so use it only as needed.    After washing, apply petroleum jelly (Vaseline) or an antibiotic ointment if your doctor prescribed one for you, followed by a bandage.    For best healing, the wound should be covered with a layer of ointment at all times. If you are not able to keep the area covered with a bandage to hold the ointment in place, this may mean re-applying the ointment several times a day.  Continue this wound care until the wound has healed and is no longer open.   Itching and mild discomfort is normal during the healing process. However, if you develop pain or severe itching, please call our office.   If you have any discomfort, you can take Tylenol  (acetaminophen ) or ibuprofen as directed on the bottle. (Please do not take these if you have an allergy to them or cannot take them for another reason).  Some redness, tenderness and white or yellow material in the wound is normal healing.  If the area becomes very sore and red, or develops a thick yellow-green material (pus), it may be infected; please notify us .    If you have stitches, return to clinic as directed to have the stitches removed. You will continue wound care for 2-3 days after the stitches  are removed.   Wound healing continues for up to one year following surgery. It is not unusual to experience pain in the scar from time to time during the interval.  If the pain becomes severe or the scar thickens, you should notify the office.    A slight amount of redness in a scar is expected for the first six months.  After six months, the redness will fade and the scar will soften and fade.  The color difference becomes less noticeable with time.  If there are any problems, return for a post-op surgery check at your earliest convenience.  To improve the appearance of the scar, you can use silicone scar gel, cream, or sheets (such as Mederma or Serica) every night for up to one year. These are available over the counter (without a prescription).  Please call our office at [phone] for any questions or concerns.   For peeling at foot  Start triamcinolone  ointment - apply peeling at bottom of left foot nightly 5 days Monday thru friday a week as needed   Topical steroids (such as triamcinolone , fluocinolone, fluocinonide , mometasone , clobetasol, halobetasol, betamethasone , hydrocortisone) can cause thinning and lightening of the skin if they are used for too long in the same area. Your physician has selected the right strength medicine for your problem and area affected on the body. Please use your medication only as directed  by your physician to prevent side effects.     Seborrheic Keratosis  What causes seborrheic keratoses? Seborrheic keratoses are harmless, common skin growths that first appear during adult life.  As time goes by, more growths appear.  Some people may develop a large number of them.  Seborrheic keratoses appear on both covered and uncovered body parts.  They are not caused by sunlight.  The tendency to develop seborrheic keratoses can be inherited.  They vary in color from skin-colored to gray, brown, or even black.  They can be either smooth or have a rough, warty  surface.   Seborrheic keratoses are superficial and look as if they were stuck on the skin.  Under the microscope this type of keratosis looks like layers upon layers of skin.  That is why at times the top layer may seem to fall off, but the rest of the growth remains and re-grows.    Treatment Seborrheic keratoses do not need to be treated, but can easily be removed in the office.  Seborrheic keratoses often cause symptoms when they rub on clothing or jewelry.  Lesions can be in the way of shaving.  If they become inflamed, they can cause itching, soreness, or burning.  Removal of a seborrheic keratosis can be accomplished by freezing, burning, or surgery. If any spot bleeds, scabs, or grows rapidly, please return to have it checked, as these can be an indication of a skin cancer.  Cryotherapy Aftercare  Wash gently with soap and water everyday.   Apply Vaseline and Band-Aid daily until healed.   Actinic keratoses are precancerous spots that appear secondary to cumulative UV radiation exposure/sun exposure over time. They are chronic with expected duration over 1 year. A portion of actinic keratoses will progress to squamous cell carcinoma of the skin. It is not possible to reliably predict which spots will progress to skin cancer and so treatment is recommended to prevent development of skin cancer.  Recommend daily broad spectrum sunscreen SPF 30+ to sun-exposed areas, reapply every 2 hours as needed.  Recommend staying in the shade or wearing long sleeves, sun glasses (UVA+UVB protection) and wide brim hats (4-inch brim around the entire circumference of the hat). Call for new or changing lesions.     Due to recent changes in healthcare laws, you may see results of your pathology and/or laboratory studies on MyChart before the doctors have had a chance to review them. We understand that in some cases there may be results that are confusing or concerning to you. Please understand that not all  results are received at the same time and often the doctors may need to interpret multiple results in order to provide you with the best plan of care or course of treatment. Therefore, we ask that you please give us  2 business days to thoroughly review all your results before contacting the office for clarification. Should we see a critical lab result, you will be contacted sooner.   If You Need Anything After Your Visit  If you have any questions or concerns for your doctor, please call our main line at [phone] and press option 4 to reach your doctor's medical assistant. If no one answers, please leave a voicemail as directed and we will return your call as soon as possible. Messages left after 4 pm will be answered the following business day.   You may also send us  a message via MyChart. We typically respond to MyChart messages within 1-2 business days.  For  prescription refills, please ask your pharmacy to contact our office. Our fax number is [phone].  If you have an urgent issue when the clinic is closed that cannot wait until the next business day, you can page your doctor at the number below.    Please note that while we do our best to be available for urgent issues outside of office hours, we are not available 24/7.   If you have an urgent issue and are unable to reach us , you may choose to seek medical care at your doctor's office, retail clinic, urgent care center, or emergency room.  If you have a medical emergency, please immediately call 911 or go to the emergency department.  Pager Numbers  - Dr. Hester: [phone]  - Dr. Jackquline: [phone]  - Dr. Claudene: [phone]   - Dr. Raymund: [phone]  In the event of inclement weather, please call our main line at [phone] for an update on the status of any delays or closures.  Dermatology Medication Tips: Please keep the boxes that topical medications come in in order to help keep track of the  instructions about where and how to use these. Pharmacies typically print the medication instructions only on the boxes and not directly on the medication tubes.   If your medication is too expensive, please contact our office at [phone] option 4 or send us  a message through MyChart.   We are unable to tell what your co-pay for medications will be in advance as this is different depending on your insurance coverage. However, we may be able to find a substitute medication at lower cost or fill out paperwork to get insurance to cover a needed medication.   If a prior authorization is required to get your medication covered by your insurance company, please allow us  1-2 business days to complete this process.  Drug prices often vary depending on where the prescription is filled and some pharmacies may offer cheaper prices.  The website www.goodrx.com contains coupons for medications through different pharmacies. The prices here do not account for what the cost may be with help from insurance (it may be cheaper with your insurance), but the website can give you the price if you did not use any insurance.  - You can print the associated coupon and take it with your prescription to the pharmacy.  - You may also stop by our office during regular business hours and pick up a GoodRx coupon card.  - If you need your prescription sent electronically to a different pharmacy, notify our office through [facility] or by phone at [phone] option 4.     Si Usted Necesita Algo Despus de Su Visita  Tambin puede enviarnos un mensaje a travs de Clinical cytogeneticist. Por lo general respondemos a los mensajes de MyChart en el transcurso de 1 a 2 das hbiles.  Para renovar recetas, por favor pida a su farmacia que se ponga en contacto con nuestra oficina. Randi lakes de fax es [place] [phone].  Si tiene un asunto urgente cuando la clnica est cerrada y que no puede esperar hasta el siguiente da hbil,  puede llamar/localizar a su doctor(a) al nmero que aparece a continuacin.   Por favor, tenga en cuenta que aunque hacemos todo lo posible para estar disponibles para asuntos urgentes fuera del horario de [place], no estamos disponibles las 24 horas del da, los 7 [address]  [address] de la [place].   Si tiene un problema urgente y no puede comunicarse con nosotros, puede optar por  buscar atencin mdica  en el consultorio de su doctor(a), en una clnica privada, en un centro de atencin urgente o en una sala de emergencias.  Si tiene Engineer, drilling, por favor llame inmediatamente al 911 o vaya a la sala de emergencias.  Nmeros de bper  - Dr. Hester: [phone]  - Dra. Jackquline: [phone]  - Dr. Claudene: [phone]  - Dra. Kitts: [phone]  En caso de inclemencias del [place], por favor llame a nuestra lnea principal al [phone] para una actualizacin sobre el estado de cualquier retraso o cierre.  Consejos para la medicacin en dermatologa: Por favor, guarde las cajas en las que vienen los medicamentos de uso tpico para ayudarle a seguir las instrucciones sobre dnde y cmo usarlos. Las farmacias generalmente imprimen las instrucciones del medicamento slo en las cajas y no directamente en los tubos del [place].   Si su medicamento es muy caro, por favor, pngase en contacto con landry rieger llamando al [phone] y presione la opcin 4 o envenos un mensaje a travs de Clinical cytogeneticist.   No podemos decirle cul ser su copago por los medicamentos por adelantado ya que esto es diferente dependiendo de la cobertura de su seguro. Sin embargo, es posible que podamos encontrar un medicamento sustituto a Audiological scientist un formulario para que el seguro cubra el medicamento que se considera necesario.   Si se requiere una autorizacin previa para que su compaa de seguros malta su medicamento, por favor permtanos de 1 a 2 das hbiles para completar este proceso.  Los precios  de los medicamentos varan con frecuencia dependiendo del Environmental consultant de dnde se surte la receta y alguna farmacias pueden ofrecer precios ms baratos.  El sitio web www.goodrx.com tiene cupones para medicamentos de Health and safety inspector. Los precios aqu no tienen en cuenta lo que podra costar con la ayuda del seguro (puede ser ms barato con su seguro), pero el sitio web puede darle el precio si no utiliz Tourist information centre manager.  - Puede imprimir el cupn correspondiente y llevarlo con su receta a la farmacia.  - Tambin puede pasar por nuestra oficina durante el horario de atencin regular y Education officer, museum una tarjeta de cupones de GoodRx.  - Si necesita que su receta se enve electrnicamente a una farmacia diferente, informe a nuestra oficina a travs de MyChart de  o por telfono llamando al [phone] y presione la opcin 4.

## 2024-07-08 NOTE — Progress Notes (Unsigned)
 Follow-Up Visit   Subjective  Darren Baker is a 53 y.o. male who presents for the following: 6 month ak follow up on face, also would psoriasis at scalp rechecked, reports some spots at top of hands, some peeling at bottom of left foot.   Hx of wart at right dorsum hand,   Hx of aks and isk  Hx psoriasis scalp using zoryve    Bottom at left foot has been using vaseline  Dark spot at left neck  Spots at tops of hands  The patient has spots, moles and lesions to be evaluated, some may be new or changing and the patient may have concern these could be cancer.  The following portions of the chart were reviewed this encounter and updated as appropriate: medications, allergies, medical history  Review of Systems:  No other skin or systemic complaints except as noted in HPI or Assessment and Plan.  Objective  Well appearing patient in no apparent distress; mood and affect are within normal limits.   A focused examination was performed of the following areas: Face, hands, scalp, left foot,   Relevant exam findings are noted in the Assessment and Plan.  face and ears x 7 (7) Erythematous thin papules/macules with gritty scale.  left neck x 1 Erythematous stuck-on, waxy papule or plaque Mid Forehead 0.6 cm pink papule    Assessment & Plan    PSORIASIS scalp Exam: pink patches scalp 5% BSA. Chronic and persistent condition with duration or expected duration over one year. Condition is symptomatic/ bothersome to patient. Not currently at goal. patient denies joint pain   Psoriasis is a chronic non-curable, but treatable genetic/hereditary disease that may have other systemic features affecting other organ systems such as joints (Psoriatic Arthritis). It is associated with an increased risk of inflammatory bowel disease, heart disease, non-alcoholic fatty liver disease, and depression.  Treatments include light and laser treatments; topical medications; and systemic  medications including oral and injectables.   Treatment Plan: Continue Zoryve  0.3% cr qd aa scalp (pt has at home)   RASH PSORIASIS VS Tinea at left plantar foot  Exam: Well-demarcated erythematous papules/plaques see photos  Chronic and persistent condition with duration or expected duration over one year. Condition is bothersome/symptomatic for patient. Currently flared. Psoriasis is a chronic non-curable, but treatable genetic/hereditary disease that may have other systemic features affecting other organ systems such as joints (Psoriatic Arthritis). It is associated with an increased risk of inflammatory bowel disease, heart disease, non-alcoholic fatty liver disease, and depression.  Treatments include light and laser treatments; topical medications; and systemic medications including oral and injectables. Treatment Plan: Discussed molecular study to rule out tinea/fungus.  Patient declines molecular study today. at left foot today  Tmc ointment apply at bed 5 days a week  Consider molecular study in future   ACTINIC DAMAGE - chronic, secondary to cumulative UV radiation exposure/sun exposure over time - diffuse scaly erythematous macules with underlying dyspigmentation - Recommend daily broad spectrum sunscreen SPF 30+ to sun-exposed areas, reapply every 2 hours as needed.  - Recommend staying in the shade or wearing long sleeves, sun glasses (UVA+UVB protection) and wide brim hats (4-inch brim around the entire circumference of the hat). - Call for new or changing lesions.  ACTINIC KERATOSIS (7) face and ears x 7 (7) Actinic keratoses are precancerous spots that appear secondary to cumulative UV radiation exposure/sun exposure over time. They are chronic with expected duration over 1 year. A portion of actinic keratoses will progress  to squamous cell carcinoma of the skin. It is not possible to reliably predict which spots will progress to skin cancer and so treatment is recommended  to prevent development of skin cancer.  Recommend daily broad spectrum sunscreen SPF 30+ to sun-exposed areas, reapply every 2 hours as needed.  Recommend staying in the shade or wearing long sleeves, sun glasses (UVA+UVB protection) and wide brim hats (4-inch brim around the entire circumference of the hat). Call for new or changing lesions. Destruction of lesion - face and ears x 7 (7) Complexity: simple   Destruction method: cryotherapy   Informed consent: discussed and consent obtained   Timeout:  patient name, date of birth, surgical site, and procedure verified Lesion destroyed using liquid nitrogen: Yes   Region frozen until ice ball extended beyond lesion: Yes   Outcome: patient tolerated procedure well with no complications   Post-procedure details: wound care instructions given    INFLAMED SEBORRHEIC KERATOSIS left neck x 1 Symptomatic, irritating, patient would like treated. Destruction of lesion - left neck x 1 Complexity: simple   Destruction method: cryotherapy   Informed consent: discussed and consent obtained   Timeout:  patient name, date of birth, surgical site, and procedure verified Lesion destroyed using liquid nitrogen: Yes   Region frozen until ice ball extended beyond lesion: Yes   Outcome: patient tolerated procedure well with no complications   Post-procedure details: wound care instructions given    NEOPLASM OF UNCERTAIN BEHAVIOR Mid Forehead Epidermal / dermal shaving  Lesion diameter (cm):  0.6 Informed consent: discussed and consent obtained   Timeout: patient name, date of birth, surgical site, and procedure verified   Procedure prep:  Patient was prepped and draped in usual sterile fashion Prep type:  Isopropyl alcohol  Anesthesia: the lesion was anesthetized in a standard fashion   Anesthetic:  1% lidocaine w/ epinephrine 1-100,000 buffered w/ 8.4% NaHCO3 Instrument used: flexible razor blade   Hemostasis achieved with: pressure, aluminum   chloride and electrodesiccation   Outcome: patient tolerated procedure well   Post-procedure details: sterile dressing applied and wound care instructions given   Dressing type: bandage and petrolatum    Destruction of lesion Complexity: extensive   Destruction method: electrodesiccation and curettage   Informed consent: discussed and consent obtained   Timeout:  patient name, date of birth, surgical site, and procedure verified Procedure prep:  Patient was prepped and draped in usual sterile fashion Prep type:  Isopropyl alcohol  Anesthesia: the lesion was anesthetized in a standard fashion   Anesthetic:  1% lidocaine w/ epinephrine 1-100,000 buffered w/ 8.4% NaHCO3 Curettage performed in three different directions: Yes   Electrodesiccation performed over the curetted area: Yes   Lesion length (cm):  0.6 Lesion width (cm):  0.6 Margin per side (cm):  0.2 Final wound size (cm):  1 Hemostasis achieved with:  pressure, aluminum  chloride and electrodesiccation Outcome: patient tolerated procedure well with no complications   Post-procedure details: sterile dressing applied and wound care instructions given   Dressing type: bandage and petrolatum    Specimen 1 - Surgical pathology Differential Diagnosis: r/o bcc   Check Margins: No R/o bcc vs other  PSORIASIS   Related Medications Roflumilast  (ZORYVE ) 0.3 % CREA Apply 1 application  topically daily. qd to aa rash scalp, and groin prn flares triamcinolone  ointment (KENALOG ) 0.1 % Apply to bottom of foot nightly 5 days a week m-f as needed for peeling TINEA PEDIS OF LEFT FOOT   Related Medications triamcinolone  ointment (KENALOG ) 0.1 % Apply  to bottom of foot nightly 5 days a week m-f as needed for peeling COUNSELING AND COORDINATION OF CARE   MEDICATION MANAGEMENT   RASH   ACTINIC SKIN DAMAGE    Return for keep follow up as scheduled .  IEleanor Blush, CMA, am acting as scribe for Alm Rhyme,  MD.   Documentation: I have reviewed the above documentation for accuracy and completeness, and I agree with the above.  Alm Rhyme, MD

## 2024-07-09 ENCOUNTER — Ambulatory Visit: Payer: Self-pay | Admitting: Dermatology

## 2024-07-09 ENCOUNTER — Encounter: Payer: Self-pay | Admitting: Dermatology

## 2024-07-09 LAB — SURGICAL PATHOLOGY

## 2024-07-09 NOTE — Telephone Encounter (Addendum)
 Called and discussed bx results with patient. He verbalized understanding and denied further questions.   ----- Message from Alm Rhyme sent at 07/09/2024  4:38 PM EDT ----- FINAL DIAGNOSIS        1. Skin, mid forehead :       SEBORRHEIC KERATOSIS, IRRITATED   Benign irritated Keratosis No further treatment needed ----- Message ----- From: Interface, Lab In [facility] [place] [facility] Sent: 07/09/2024   3:50 PM EDT To: Alm JAYSON Rhyme, MD

## 2024-07-11 ENCOUNTER — Ambulatory Visit: Admitting: Family Medicine

## 2024-07-11 ENCOUNTER — Encounter: Payer: Self-pay | Admitting: Family Medicine

## 2024-07-11 VITALS — BP 150/92 | HR 70 | Temp 97.7°F | Ht 71.0 in | Wt 287.6 lb

## 2024-07-11 DIAGNOSIS — Z23 Encounter for immunization: Secondary | ICD-10-CM | POA: Diagnosis not present

## 2024-07-11 DIAGNOSIS — R42 Dizziness and giddiness: Secondary | ICD-10-CM

## 2024-07-11 MED ORDER — MECLIZINE HCL 25 MG PO TABS: 25.0000 mg | ORAL_TABLET | Freq: Three times a day (TID) | ORAL | 0 refills | Status: AC | PRN

## 2024-07-11 MED ORDER — BUSPIRONE HCL 15 MG PO TABS: 15.0000 mg | ORAL_TABLET | Freq: Two times a day (BID) | ORAL | 5 refills | Status: DC

## 2024-07-11 MED ORDER — MELOXICAM 7.5 MG PO TABS: 7.5000 mg | ORAL_TABLET | Freq: Two times a day (BID) | ORAL | 3 refills | Status: DC

## 2024-07-11 NOTE — Progress Notes (Signed)
 Subjective:    Patient ID: Darren Baker, male    DOB: Feb 28, 1971, 53 y.o.   MRN: [id number]  Over the last 2 weeks, the patient has been working on a mobile home.  He has been disconnecting the plumbing, the electrical, etc.  He has been crawling on the ground and rolling over and straining to get to various locations under the house.  Last night, he had vertigo.  He states that he was laying on his left side and the window was twitching in his field of vision.  When he rolled over to the right side the room started to spin.  He has a history of vertigo in May.  However yesterday he was also feeling dizzy and lightheaded like he could pass out whenever he stood up.  His blood pressure was low with a systolic blood pressure less than 110.  He was also taking tizanidine for back pain.  Past Medical History:  Diagnosis Date   DDD (degenerative disc disease), lumbar    Dysplastic nevus 02/24/2016   L chest - mild    Dysplastic nevus 08/11/2021   Left xyphoid - moderate   Hypertension    Low back pain    Migraine    Obesity    Post concussion syndrome    Current Outpatient Medications on File Prior to Visit  Medication Sig Dispense Refill   acetaminophen  (TYLENOL ) 500 MG tablet Take 1,000 mg by mouth every 6 (six) hours as needed for moderate pain.     albuterol  (VENTOLIN  HFA) 108 (90 Base) MCG/ACT inhaler INHALE 1-2 PUFFS BY MOUTH EVERY 6 HOURS AS NEEDED FOR WHEEZE OR SHORTNESS OF BREATH 18 each 1   ALPRAZolam  (XANAX ) 0.5 MG tablet TAKE 1 TABLET (0.5 MG TOTAL) BY MOUTH 3 (THREE) TIMES DAILY AS NEEDED FOR ANXIETY. 90 tablet 0   eletriptan (RELPAX) 40 MG tablet Take 40 mg by mouth daily as needed for migraine. One tablet by mouth at onset of headache. May repeat in 2 hours if headache persists or recurs.     fluticasone  (FLONASE ) 50 MCG/ACT nasal spray Place 2 sprays into both nostrils daily. 1 g 11   fluticasone -salmeterol (ADVAIR HFA) 115-21 MCG/ACT inhaler Inhale 2 puffs into the lungs  2 (two) times daily. 1 each 12   fluticasone -salmeterol (ADVAIR) 250-50 MCG/ACT AEPB Inhale 1 puff into the lungs every 12 (twelve) hours. 60 each 5   HYDROcodone -acetaminophen  (NORCO/VICODIN) 5-325 MG tablet Take 1 tablet by mouth every 6 (six) hours as needed for moderate pain. 90 tablet 0   linaclotide  (LINZESS ) 145 MCG CAPS capsule Take 1 capsule (145 mcg total) by mouth daily before breakfast. 90 capsule 3   losartan -hydrochlorothiazide (HYZAAR) 50-12.5 MG tablet Take 1 tablet by mouth daily. 90 tablet 0   pantoprazole  (PROTONIX ) 40 MG tablet Take 1 tablet (40 mg total) by mouth daily. 30 tablet 3   Roflumilast  (ZORYVE ) 0.3 % CREA Apply 1 application  topically daily. qd to aa rash scalp, and groin prn flares 60 g 11   sildenafil  (VIAGRA ) 100 MG tablet Take 0.5-1 tablets (50-100 mg total) by mouth daily as needed for erectile dysfunction. 5 tablet 11   tiZANidine (ZANAFLEX) 4 MG tablet Take 4-8 mg by mouth at bedtime as needed.     triamcinolone  ointment (KENALOG ) 0.1 % Apply to bottom of foot nightly 5 days a week m-f as needed for peeling 30 g 6   valACYclovir  (VALTREX ) 1000 MG tablet Take 1 tablet (1,000 mg total) by mouth  as directed. Take 2 po with first symptoms of fever blister then 2 po 12 hours later 30 tablet 11   verapamil (CALAN-SR) 240 MG CR tablet Take 240 mg by mouth daily.  3   benzonatate  (TESSALON  PERLES) 100 MG capsule Take 1 capsule (100 mg total) by mouth 3 (three) times daily as needed. (Patient not taking: Reported on 07/11/2024) 30 capsule 0   chlorpheniramine-HYDROcodone  (TUSSIONEX) 10-8 MG/5ML Take 5 mLs by mouth every 12 (twelve) hours as needed for cough. (Patient not taking: Reported on 07/11/2024) 120 mL 0   No current facility-administered medications on file prior to visit.   Allergies  Allergen Reactions   Amoxicillin -Pot Clavulanate Nausea And Vomiting and Other (See Comments)    Did not feel right, had it recently   Social History   Socioeconomic  History   Marital status: Married    Spouse name: Not on file   Number of children: Not on file   Years of education: Not on file   Highest education level: Not on file  Occupational History   Not on file  Tobacco Use   Smoking status: Never   Smokeless tobacco: Never  Vaping Use   Vaping status: Never Used  Substance and Sexual Activity   Alcohol  use: Yes    Comment: Rare   Drug use: No   Sexual activity: Not on file    Comment: Married, 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  Other Topics Concern   Not on file  Social History Narrative   Not on file   Social Drivers of Health   Financial Resource Strain: Low Risk  (02/21/2023)   Received from [facility]   Overall Financial Resource Strain (CARDIA)    Difficulty of Paying Living Expenses: Not very hard  Food Insecurity: No Food Insecurity (06/09/2024)   Received from [facility]   Hunger Vital Sign    Within the past 12 months, you worried that your food would run out before you got the money to buy more.: Never true    Within the past 12 months, the food you bought just didn't last and you didn't have money to get more.: Never true  Transportation Needs: No Transportation Needs (06/09/2024)   Received from [facility] - Transportation    In the past 12 months, has lack of transportation kept you from medical appointments or from getting medications?: No    In the past 12 months, has lack of transportation kept you from meetings, work, or from getting things needed for daily living?: No  Physical Activity: Unknown (10/22/2022)   Received from [facility]   Exercise Vital Sign    On average, how many days per week do you engage in moderate to strenuous exercise (like a brisk walk)?: Patient declined    Minutes of Exercise per Session: Not on file  Stress: Patient Declined (10/22/2022)   Received from [facility] of Occupational Health - Occupational Stress Questionnaire    Feeling of Stress :  Patient declined  Social Connections: Unknown (02/02/2022)   Received from [facility]   Social Network    Social Network: Not on file  Intimate Partner Violence: Not At Risk (02/05/2024)   Received from [facility]   HITS    Over the last 12 months how often did your partner physically hurt you?: Never    Over the last 12 months how often did your partner insult you or talk down to you?: Never  Over the last 12 months how often did your partner threaten you with physical harm?: Never    Over the last 12 months how often did your partner scream or curse at you?: Never   Family History  Problem Relation Age of Onset   Diabetes Father    Heart disease Father    Hyperlipidemia Father    Hypertension Father      Review of Systems  All other systems reviewed and are negative.      Objective:   Physical Exam Vitals reviewed.  Constitutional:      General: He is not in acute distress.    Appearance: He is well-developed. He is not diaphoretic.  HENT:     Head: Normocephalic and atraumatic.     Right Ear: External ear normal.     Left Ear: External ear normal.     Nose: Nose normal.  Eyes:     General:        Right eye: No discharge.        Left eye: No discharge.     Conjunctiva/sclera: Conjunctivae normal.  Neck:     Thyroid: No thyromegaly.     Vascular: No JVD.     Trachea: No tracheal deviation.  Cardiovascular:     Rate and Rhythm: Normal rate and regular rhythm.     Heart sounds: Normal heart sounds. No murmur heard.    No friction rub. No gallop.  Pulmonary:     Effort: Pulmonary effort is normal. No respiratory distress.     Breath sounds: Normal breath sounds. No stridor. No wheezing or rales.  Chest:     Chest wall: No tenderness.  Abdominal:     General: Bowel sounds are normal. There is no distension.     Palpations: Abdomen is soft. There is no mass.     Tenderness: There is no abdominal tenderness. There is no guarding or rebound.  Musculoskeletal:         General: No tenderness or deformity.     Cervical back: Normal range of motion and neck supple.  Lymphadenopathy:     Cervical: No cervical adenopathy.  Neurological:     Mental Status: He is alert and oriented to person, place, and time.     Cranial Nerves: No cranial nerve deficit.     Sensory: No sensory deficit.     Motor: No abnormal muscle tone.     Coordination: Coordination normal.           Assessment & Plan:  Flu vaccine need - Plan: Flu vaccine trivalent PF, 6mos and older(Flulaval,Afluria,Fluarix,Fluzone)  Dizziness His dizziness is multifactorial.  He certainly has vertigo.  He can use meclizine  25 mg every 8 hours as needed for vertigo.  We discussed the natural history and causes of vertigo.  He also sounds like he has been working strenuously which could have led to dehydration which could also be causing hypotension.  I recommended reducing his verapamil to 120 mg to raise his blood pressure slightly.  He also could have had some dizziness and disequilibrium due to taking a muscle relaxer on top of all of the other factors.  Recommended he avoid the tizanidine and using meloxicam  7.5 mg twice daily as needed for back pain

## 2024-07-25 ENCOUNTER — Other Ambulatory Visit: Payer: Self-pay | Admitting: Family Medicine

## 2024-07-25 ENCOUNTER — Telehealth: Payer: Self-pay

## 2024-07-25 ENCOUNTER — Ambulatory Visit

## 2024-07-25 MED ORDER — AZITHROMYCIN 250 MG PO TABS: ORAL_TABLET | ORAL | 0 refills | Status: AC

## 2024-07-25 NOTE — Telephone Encounter (Signed)
 Pt c/o chest congestion with yellowish brown mucous for the last week. Pt states he feels like what he did when he got sick in May of this year. I advised him to do Mucinex over the weekend. Pt asks if an antibiotic can be sent in for him also? Pt has an appt scheduled for Monday. Thanks.

## 2024-07-28 ENCOUNTER — Ambulatory Visit: Admitting: Family Medicine

## 2024-07-28 ENCOUNTER — Encounter: Payer: Self-pay | Admitting: Family Medicine

## 2024-07-28 VITALS — BP 142/78 | HR 84 | Temp 98.4°F | Ht 71.0 in | Wt 287.6 lb

## 2024-07-28 DIAGNOSIS — J40 Bronchitis, not specified as acute or chronic: Secondary | ICD-10-CM | POA: Diagnosis not present

## 2024-07-28 MED ORDER — PREDNISONE 20 MG PO TABS: ORAL_TABLET | ORAL | 0 refills | Status: AC

## 2024-07-28 NOTE — Progress Notes (Signed)
 Subjective:    Patient ID: Darren Baker, male    DOB: Aug 21, 1971, 53 y.o.   MRN: [id number] Starting last week, the patient began coughing severely.  His cough is productive green and yellow-brown mucus.  He is audibly wheezing at night and having shortness of breath.  He started a Z-Pak over the weekend.  Today on exam, I do not appreciate any crackles but he does have rhonchorous breath sounds and expiratory wheezing.  He denies any pleurisy or hemoptysis. Past Medical History:  Diagnosis Date   DDD (degenerative disc disease), lumbar    Dysplastic nevus 02/24/2016   L chest - mild    Dysplastic nevus 08/11/2021   Left xyphoid - moderate   Hypertension    Low back pain    Migraine    Obesity    Post concussion syndrome    Current Outpatient Medications on File Prior to Visit  Medication Sig Dispense Refill   acetaminophen  (TYLENOL ) 500 MG tablet Take 1,000 mg by mouth every 6 (six) hours as needed for moderate pain.     albuterol  (VENTOLIN  HFA) 108 (90 Base) MCG/ACT inhaler INHALE 1-2 PUFFS BY MOUTH EVERY 6 HOURS AS NEEDED FOR WHEEZE OR SHORTNESS OF BREATH 18 each 1   ALPRAZolam  (XANAX ) 0.5 MG tablet TAKE 1 TABLET (0.5 MG TOTAL) BY MOUTH 3 (THREE) TIMES DAILY AS NEEDED FOR ANXIETY. 90 tablet 0   azithromycin  (ZITHROMAX ) 250 MG tablet 2 tabs poqday1, 1 tab poqday 2-5 6 tablet 0   benzonatate  (TESSALON  PERLES) 100 MG capsule Take 1 capsule (100 mg total) by mouth 3 (three) times daily as needed. (Patient not taking: Reported on 07/11/2024) 30 capsule 0   busPIRone  (BUSPAR ) 15 MG tablet Take 1 tablet (15 mg total) by mouth 2 (two) times daily. 60 tablet 5   chlorpheniramine-HYDROcodone  (TUSSIONEX) 10-8 MG/5ML Take 5 mLs by mouth every 12 (twelve) hours as needed for cough. (Patient not taking: Reported on 07/11/2024) 120 mL 0   eletriptan (RELPAX) 40 MG tablet Take 40 mg by mouth daily as needed for migraine. One tablet by mouth at onset of headache. May repeat in 2 hours if headache  persists or recurs.     fluticasone  (FLONASE ) 50 MCG/ACT nasal spray Place 2 sprays into both nostrils daily. 1 g 11   fluticasone -salmeterol (ADVAIR HFA) 115-21 MCG/ACT inhaler Inhale 2 puffs into the lungs 2 (two) times daily. 1 each 12   fluticasone -salmeterol (ADVAIR) 250-50 MCG/ACT AEPB Inhale 1 puff into the lungs every 12 (twelve) hours. 60 each 5   HYDROcodone -acetaminophen  (NORCO/VICODIN) 5-325 MG tablet Take 1 tablet by mouth every 6 (six) hours as needed for moderate pain. 90 tablet 0   linaclotide  (LINZESS ) 145 MCG CAPS capsule Take 1 capsule (145 mcg total) by mouth daily before breakfast. 90 capsule 3   losartan -hydrochlorothiazide (HYZAAR) 50-12.5 MG tablet Take 1 tablet by mouth daily. 90 tablet 0   meclizine  (ANTIVERT ) 25 MG tablet Take 1 tablet (25 mg total) by mouth 3 (three) times daily as needed. 30 tablet 0   meloxicam  (MOBIC ) 7.5 MG tablet Take 1 tablet (7.5 mg total) by mouth in the morning and at bedtime. 60 tablet 3   pantoprazole  (PROTONIX ) 40 MG tablet TAKE 1 TABLET BY MOUTH EVERY DAY 90 tablet 1   Roflumilast  (ZORYVE ) 0.3 % CREA Apply 1 application  topically daily. qd to aa rash scalp, and groin prn flares 60 g 11   sildenafil  (VIAGRA ) 100 MG tablet Take 0.5-1 tablets (50-100 mg total)  by mouth daily as needed for erectile dysfunction. 5 tablet 11   tiZANidine (ZANAFLEX) 4 MG tablet Take 4-8 mg by mouth at bedtime as needed.     triamcinolone  ointment (KENALOG ) 0.1 % Apply to bottom of foot nightly 5 days a week m-f as needed for peeling 30 g 6   valACYclovir  (VALTREX ) 1000 MG tablet Take 1 tablet (1,000 mg total) by mouth as directed. Take 2 po with first symptoms of fever blister then 2 po 12 hours later 30 tablet 11   verapamil (CALAN-SR) 240 MG CR tablet Take 240 mg by mouth daily.  3   No current facility-administered medications on file prior to visit.   Allergies  Allergen Reactions   Amoxicillin -Pot Clavulanate Nausea And Vomiting and Other (See Comments)     Did not feel right, had it recently   Social History   Socioeconomic History   Marital status: Married    Spouse name: Not on file   Number of children: Not on file   Years of education: Not on file   Highest education level: Not on file  Occupational History   Not on file  Tobacco Use   Smoking status: Never   Smokeless tobacco: Never  Vaping Use   Vaping status: Never Used  Substance and Sexual Activity   Alcohol  use: Yes    Comment: Rare   Drug use: No   Sexual activity: Not on file    Comment: Married, works for loss adjuster, chartered.  Other Topics Concern   Not on file  Social History Narrative   Not on file   Social Drivers of Health   Financial Resource Strain: Low Risk  (02/21/2023)   Received from [facility]   Overall Financial Resource Strain (CARDIA)    Difficulty of Paying Living Expenses: Not very hard  Food Insecurity: No Food Insecurity (06/09/2024)   Received from [facility]   Hunger Vital Sign    Within the past 12 months, you worried that your food would run out before you got the money to buy more.: Never true    Within the past 12 months, the food you bought just didn't last and you didn't have money to get more.: Never true  Transportation Needs: No Transportation Needs (06/09/2024)   Received from [facility] - Transportation    In the past 12 months, has lack of transportation kept you from medical appointments or from getting medications?: No    In the past 12 months, has lack of transportation kept you from meetings, work, or from getting things needed for daily living?: No  Physical Activity: Unknown (10/22/2022)   Received from [facility]   Exercise Vital Sign    On average, how many days per week do you engage in moderate to strenuous exercise (like a brisk walk)?: Patient declined    Minutes of Exercise per Session: Not on file  Stress: Patient Declined (10/22/2022)   Received from [facility]  Occupational Health - Occupational Stress Questionnaire    Feeling of Stress : Patient declined  Social Connections: Unknown (02/02/2022)   Received from [facility]   Social Network    Social Network: Not on file  Intimate Partner Violence: Not At Risk (02/05/2024)   Received from [facility]   HITS    Over the last 12 months how often did your partner physically hurt you?: Never    Over the last 12 months how often did your partner  insult you or talk down to you?: Never    Over the last 12 months how often did your partner threaten you with physical harm?: Never    Over the last 12 months how often did your partner scream or curse at you?: Never   Family History  Problem Relation Age of Onset   Diabetes Father    Heart disease Father    Hyperlipidemia Father    Hypertension Father      Review of Systems  Gastrointestinal:  Positive for abdominal pain.  All other systems reviewed and are negative.      Objective:   Physical Exam Vitals reviewed.  Constitutional:      General: He is not in acute distress.    Appearance: He is well-developed. He is not diaphoretic.  HENT:     Head: Normocephalic and atraumatic.     Right Ear: External ear normal.     Left Ear: External ear normal.     Nose: Nose normal.  Eyes:     General:        Right eye: No discharge.        Left eye: No discharge.     Conjunctiva/sclera: Conjunctivae normal.  Neck:     Thyroid: No thyromegaly.     Vascular: No JVD.     Trachea: No tracheal deviation.  Cardiovascular:     Rate and Rhythm: Normal rate and regular rhythm.     Heart sounds: Normal heart sounds. No murmur heard.    No friction rub. No gallop.  Pulmonary:     Effort: Pulmonary effort is normal. No respiratory distress.     Breath sounds: No stridor. Wheezing and rhonchi present. No rales.  Chest:     Chest wall: No tenderness.  Abdominal:     General: Bowel sounds are normal. There is no distension.     Palpations: Abdomen is  soft. There is no mass.     Tenderness: There is no abdominal tenderness. There is no guarding or rebound.  Musculoskeletal:        General: No tenderness or deformity.     Cervical back: Normal range of motion and neck supple.  Lymphadenopathy:     Cervical: No cervical adenopathy.  Neurological:     Mental Status: He is alert and oriented to person, place, and time.     Cranial Nerves: No cranial nerve deficit.     Sensory: No sensory deficit.     Motor: No abnormal muscle tone.     Coordination: Coordination normal.           Assessment & Plan:  Bronchitis I believe the patient has bronchitis, use albuterol  2 puffs every 6 hours as needed for wheezing.  Begin prednisone  taper pack for wheezing and congestion.  Complete Z-Pak for purulent sputum.  This is the second time this has happened to the patient.  I believe that he is developing underlying asthma/COPD.  He may benefit from using Advair or another preventative medication on a daily basis if this becomes recurrent and frequent

## 2024-08-02 ENCOUNTER — Other Ambulatory Visit: Payer: Self-pay | Admitting: Family Medicine

## 2024-08-17 ENCOUNTER — Other Ambulatory Visit: Payer: Self-pay | Admitting: Family Medicine

## 2024-08-17 DIAGNOSIS — I10 Essential (primary) hypertension: Secondary | ICD-10-CM

## 2024-08-19 NOTE — Telephone Encounter (Signed)
 Requested Prescriptions  Pending Prescriptions Disp Refills   losartan -hydrochlorothiazide (HYZAAR) 50-12.5 MG tablet [Pharmacy Med Name: LOSARTAN -HCTZ 50-12.5 MG TAB] 90 tablet 0    Sig: TAKE 1 TABLET BY MOUTH EVERY DAY     Cardiovascular: ARB + Diuretic Combos Failed - 08/19/2024  1:50 PM      Failed - eGFR is 10 or above and within 180 days    GFR, Est African American  Date Value Ref Range Status  12/31/2020 78 > OR = 60 mL/min/1.3m2 Final   GFR, Est Non African American  Date Value Ref Range Status  12/31/2020 67 > OR = 60 mL/min/1.39m2 Final   GFR, Estimated  Date Value Ref Range Status  05/25/2021 >60 >60 mL/min Final    Comment:    (NOTE) Calculated using the CKD-EPI Creatinine Equation (2021)    eGFR  Date Value Ref Range Status  04/11/2023 70 > OR = 60 mL/min/1.31m2 Final         Failed - Last BP in normal range    BP Readings from Last 1 Encounters:  07/28/24 (!) 142/78         Passed - K in normal range and within 180 days    Potassium  Date Value Ref Range Status  04/22/2024 4.2 3.5 - 5.3 mmol/L Final         Passed - Na in normal range and within 180 days    Sodium  Date Value Ref Range Status  04/22/2024 137 135 - 146 mmol/L Final         Passed - Cr in normal range and within 180 days    Creat  Date Value Ref Range Status  04/22/2024 1.10 0.70 - 1.30 mg/dL Final         Passed - Patient is not pregnant      Passed - Valid encounter within last 6 months    Recent Outpatient Visits           3 weeks ago Bronchitis   [place] [facility] Pickard, Butler DASEN, MD   1 month ago Flu vaccine need   [place] [facility] Duanne Butler DASEN, MD   3 months ago General medical exam   [place] [facility] Duanne Butler DASEN, MD   4 months ago Pneumonia of left upper lobe due to infectious organism   [place] [facility] Aletha Bene, MD   4 months ago Pneumonia of left  upper lobe due to infectious organism   [place] [facility] Aletha Bene, MD       Future Appointments             In 3 months Hester Alm BROCKS, MD [facility]
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
## 2024-09-04 ENCOUNTER — Other Ambulatory Visit

## 2024-09-04 DIAGNOSIS — R918 Other nonspecific abnormal finding of lung field: Secondary | ICD-10-CM

## 2024-09-05 ENCOUNTER — Other Ambulatory Visit: Payer: Self-pay | Admitting: Family Medicine

## 2024-09-08 NOTE — Telephone Encounter (Signed)
 Requested medication (s) are due for refill today: yes  Requested medication (s) are on the active medication list: yes  Last refill:  06/05/24  Future visit scheduled: {Yes  Notes to clinic:  Unable to refill per protocol, cannot delegate.      Requested Prescriptions  Pending Prescriptions Disp Refills   ALPRAZolam  (XANAX ) 0.5 MG tablet [Pharmacy Med Name: ALPRAZOLAM  0.5 MG TABLET] 90 tablet 0    Sig: TAKE 1 TABLET (0.5 MG TOTAL) BY MOUTH 3 (THREE) TIMES DAILY AS NEEDED FOR ANXIETY.     Not Delegated - Psychiatry: Anxiolytics/Hypnotics 2 Failed - 09/08/2024  3:39 PM      Failed - This refill cannot be delegated      Failed - Urine Drug Screen completed in last 360 days      Passed - Patient is not pregnant      Passed - Valid encounter within last 6 months    Recent Outpatient Visits           1 month ago Bronchitis   [place] [facility] Pickard, Butler DASEN, MD   1 month ago Flu vaccine need   [facility] [facility] Duanne Butler DASEN, MD   4 months ago General medical exam   [place] [facility] Duanne Butler DASEN, MD   4 months ago Pneumonia of left upper lobe due to infectious organism   [place] [facility] Aletha Bene, MD   5 months ago Pneumonia of left upper lobe due to infectious organism   [place] [facility] Aletha Bene, MD       Future Appointments             In 2 months Hester Alm BROCKS, MD [facility]

## 2024-09-11 ENCOUNTER — Ambulatory Visit: Admitting: Pulmonary Disease

## 2024-09-11 ENCOUNTER — Encounter: Payer: Self-pay | Admitting: Pulmonary Disease

## 2024-09-11 VITALS — BP 126/82 | HR 88 | Ht 71.0 in | Wt 294.0 lb

## 2024-09-11 DIAGNOSIS — J4520 Mild intermittent asthma, uncomplicated: Secondary | ICD-10-CM | POA: Diagnosis not present

## 2024-09-11 DIAGNOSIS — E669 Obesity, unspecified: Secondary | ICD-10-CM | POA: Diagnosis not present

## 2024-09-11 NOTE — Progress Notes (Signed)
 Established Patient Pulmonology Office Visit   Subjective:  Patient ID: Darren Baker, male    DOB: 1970-12-11  MRN: [id number]  CC:  Chief Complaint  Patient presents with   Medical Management of Chronic Issues    Pt states last month SOB Rx pred     Discussed the use of AI scribe software for clinical note transcription with the patient, who gave verbal consent to proceed.  History of Present Illness Darren Baker is a 53 year old male with a history of post-infectious reactive airways disease who returns for follow up.  He has a chronic cough that persists despite treatment. He was initially seen on July 03, 2024, for on going cough after recent upper respiratory infection with possible interstitial lung abnormality noted on CT Chest scan. He was treated for post-infectious reactive airways disease with Wixela 250-50mcg one puff twice daily as needed and Albuterol  as needed. He continues pantoprazole  for GERD.  In late October he was treated for bronchitis by his primary team with prednisone  and Zpak. He showed pictures of purulent sputum production at that time. He is feeling better currently. He reports significant weight gain with prednisone  use this year. He has Wixela and Albuterol  but is reluctant to use them regularly.  He reports persistent but intermittent urge to cough every couple of days, with occasional wheezing since his COVID infection. He notes weight gain which he associates with multiple prednisone  courses over his lifetime and is worried about long-term steroid use.        ROS   Current Medications[1]      Objective:  BP 126/82   Pulse 88   Ht 5' 11 (1.803 m) Comment: per pt  Wt 294 lb (133.4 kg)   SpO2 98%   BMI 41.00 kg/m   Wt Readings from Last 3 Encounters:  09/11/24 294 lb (133.4 kg)  07/28/24 287 lb 9.6 oz (130.5 kg)  07/11/24 287 lb 9.6 oz (130.5 kg)   BMI Readings from Last 3 Encounters:  09/11/24 41.00 kg/m  07/28/24 40.11  kg/m  07/11/24 40.11 kg/m    Physical Exam Constitutional:      General: He is not in acute distress.    Appearance: Normal appearance. He is obese.  Eyes:     General: No scleral icterus.    Conjunctiva/sclera: Conjunctivae normal.  Cardiovascular:     Rate and Rhythm: Normal rate and regular rhythm.  Pulmonary:     Breath sounds: No wheezing, rhonchi or rales.  Musculoskeletal:     Right lower leg: No edema.     Left lower leg: No edema.  Skin:    General: Skin is warm and dry.  Neurological:     General: No focal deficit present.      Diagnostic Review:  Last CBC Lab Results  Component Value Date   WBC 7.4 04/22/2024   HGB 13.7 04/22/2024   HCT 41.6 04/22/2024   MCV 89.8 04/22/2024   MCH 29.6 04/22/2024   RDW 12.8 04/22/2024   PLT 271 04/22/2024   Last metabolic panel Lab Results  Component Value Date   GLUCOSE 94 04/22/2024   NA 137 04/22/2024   K 4.2 04/22/2024   CL 103 04/22/2024   CO2 28 04/22/2024   BUN 16 04/22/2024   CREATININE 1.10 04/22/2024   EGFR 70 04/11/2023   CALCIUM 9.4 04/22/2024   PROT 6.3 04/22/2024   ALBUMIN 4.1 05/25/2021   BILITOT 0.3 04/22/2024   ALKPHOS 69 05/25/2021  AST 18 04/22/2024   ALT 16 04/22/2024   ANIONGAP 9 05/25/2021    CT Chest 09/04/24 HRCT FINDINGS AND LUNGS AND PLEURA: No pneumothorax. No pleural effusion. The central airways are clear. No acute consolidative airspace disease. No lung masses or significant pulmonary nodules. No significant regions of air trapping or evidence of tracheobronchomalacia on the expiration sequence. No significant persistent regions of subpleural reticulation, ground glass opacity, traction bronchiectasis, architectural distortion or frank honeycombing.     Assessment & Plan:   Assessment & Plan Mild intermittent reactive airway disease without complication  Orders:   Spirometry with graph; Future   Assessment and Plan Assessment & Plan Mild intermittent reactive  airway disease Recent exacerbation managed with prednisone  and azithromycin . CT shows no interstitial lung disease, improved atelectasis. Discussed inhaled corticosteroid (Wixela) to reduce systemic steroid use. Explained benefits of Wixela and risks of systemic steroids. Addressed concerns about medication dependence. - Ordered spirometry pre- and post-albuterol  to evaluate airway response. - Use Wixela as needed until spirometry completion. - Initiate Wixela 250-50 mcg, one puff BID post-spirometry. - Use albuterol  PRN for acute symptoms, two puffs Q4-6H. - Follow-up in three months to evaluate maintenance therapy response.  Obesity - recommend weight loss    Return in about 3 months (around 12/10/2024) for f/u visit Dr. Kara.   Dorn KATHEE Kara, MD     [1]  Current Outpatient Medications:    acetaminophen  (TYLENOL ) 500 MG tablet, Take 1,000 mg by mouth every 6 (six) hours as needed for moderate pain., Disp: , Rfl:    albuterol  (VENTOLIN  HFA) 108 (90 Base) MCG/ACT inhaler, INHALE 1-2 PUFFS BY MOUTH EVERY 6 HOURS AS NEEDED FOR WHEEZE OR SHORTNESS OF BREATH, Disp: 18 each, Rfl: 1   ALPRAZolam  (XANAX ) 0.5 MG tablet, TAKE 1 TABLET (0.5 MG TOTAL) BY MOUTH 3 (THREE) TIMES DAILY AS NEEDED FOR ANXIETY., Disp: 90 tablet, Rfl: 0   azithromycin  (ZITHROMAX ) 250 MG tablet, 2 tabs poqday1, 1 tab poqday 2-5, Disp: 6 tablet, Rfl: 0   benzonatate  (TESSALON  PERLES) 100 MG capsule, Take 1 capsule (100 mg total) by mouth 3 (three) times daily as needed., Disp: 30 capsule, Rfl: 0   busPIRone  (BUSPAR ) 15 MG tablet, TAKE 1 TABLET BY MOUTH 2 TIMES DAILY., Disp: 180 tablet, Rfl: 2   chlorpheniramine-HYDROcodone  (TUSSIONEX) 10-8 MG/5ML, Take 5 mLs by mouth every 12 (twelve) hours as needed for cough., Disp: 120 mL, Rfl: 0   eletriptan (RELPAX) 40 MG tablet, Take 40 mg by mouth daily as needed for migraine. One tablet by mouth at onset of headache. May repeat in 2 hours if headache persists or recurs., Disp: ,  Rfl:    fluticasone  (FLONASE ) 50 MCG/ACT nasal spray, Place 2 sprays into both nostrils daily., Disp: 1 g, Rfl: 11   fluticasone -salmeterol (ADVAIR HFA) 115-21 MCG/ACT inhaler, Inhale 2 puffs into the lungs 2 (two) times daily., Disp: 1 each, Rfl: 12   fluticasone -salmeterol (ADVAIR) 250-50 MCG/ACT AEPB, Inhale 1 puff into the lungs every 12 (twelve) hours., Disp: 60 each, Rfl: 5   HYDROcodone -acetaminophen  (NORCO/VICODIN) 5-325 MG tablet, Take 1 tablet by mouth every 6 (six) hours as needed for moderate pain., Disp: 90 tablet, Rfl: 0   linaclotide  (LINZESS ) 145 MCG CAPS capsule, Take 1 capsule (145 mcg total) by mouth daily before breakfast., Disp: 90 capsule, Rfl: 3   losartan -hydrochlorothiazide (HYZAAR) 50-12.5 MG tablet, TAKE 1 TABLET BY MOUTH EVERY DAY, Disp: 90 tablet, Rfl: 0   meclizine  (ANTIVERT ) 25 MG tablet, Take 1 tablet (25  mg total) by mouth 3 (three) times daily as needed., Disp: 30 tablet, Rfl: 0   meloxicam  (MOBIC ) 7.5 MG tablet, Take 1 tablet (7.5 mg total) by mouth in the morning and at bedtime., Disp: 60 tablet, Rfl: 3   pantoprazole  (PROTONIX ) 40 MG tablet, TAKE 1 TABLET BY MOUTH EVERY DAY, Disp: 90 tablet, Rfl: 1   predniSONE  (DELTASONE ) 20 MG tablet, 3 tabs poqday 1-2, 2 tabs poqday 3-4, 1 tab poqday 5-6, Disp: 12 tablet, Rfl: 0   Roflumilast  (ZORYVE ) 0.3 % CREA, Apply 1 application  topically daily. qd to aa rash scalp, and groin prn flares, Disp: 60 g, Rfl: 11   sildenafil  (VIAGRA ) 100 MG tablet, Take 0.5-1 tablets (50-100 mg total) by mouth daily as needed for erectile dysfunction., Disp: 5 tablet, Rfl: 11   tiZANidine (ZANAFLEX) 4 MG tablet, Take 4-8 mg by mouth at bedtime as needed., Disp: , Rfl:    triamcinolone  ointment (KENALOG ) 0.1 %, Apply to bottom of foot nightly 5 days a week m-f as needed for peeling, Disp: 30 g, Rfl: 6   valACYclovir  (VALTREX ) 1000 MG tablet, Take 1 tablet (1,000 mg total) by mouth as directed. Take 2 po with first symptoms of fever blister then 2  po 12 hours later, Disp: 30 tablet, Rfl: 11   verapamil (CALAN-SR) 240 MG CR tablet, Take 240 mg by mouth daily., Disp: , Rfl: 3

## 2024-09-11 NOTE — Patient Instructions (Addendum)
 Continue Wixella inhaler 1 puff twice daily after you take your breathing test  Use albuterol  inhaler 1-2 puffs every 4-6 hours as needed  Schedule spirometry test at the front desk   Your CT Chest scan is reassuring and does not show interstitial lung disease  Follow up in 3 months, call sooner if needed

## 2024-10-07 ENCOUNTER — Encounter: Payer: Self-pay | Admitting: Family Medicine

## 2024-10-07 ENCOUNTER — Ambulatory Visit: Admitting: Family Medicine

## 2024-10-07 VITALS — BP 144/98 | HR 81 | Temp 98.3°F | Ht 71.0 in | Wt 290.0 lb

## 2024-10-07 DIAGNOSIS — I10 Essential (primary) hypertension: Secondary | ICD-10-CM

## 2024-10-07 DIAGNOSIS — R5383 Other fatigue: Secondary | ICD-10-CM | POA: Diagnosis not present

## 2024-10-07 LAB — TESTOSTERONE TOTAL,FREE,BIO, MALES
Albumin: 4.7 g/dL (ref 3.6–5.1)
Sex Hormone Binding: 34 nmol/L (ref 10–50)
Testosterone, Bioavailable: 109.3 ng/dL — ABNORMAL LOW (ref 110.0–575.0)
Testosterone, Free: 51 pg/mL (ref 46.0–224.0)
Testosterone: 404 ng/dL (ref 250–827)

## 2024-10-07 LAB — COMPREHENSIVE METABOLIC PANEL WITH GFR
AG Ratio: 2 (calc) (ref 1.0–2.5)
ALT: 24 U/L (ref 9–46)
AST: 30 U/L (ref 10–35)
Albumin: 4.7 g/dL (ref 3.6–5.1)
Alkaline phosphatase (APISO): 84 U/L (ref 35–144)
BUN: 22 mg/dL (ref 7–25)
CO2: 27 mmol/L (ref 20–32)
Calcium: 9.9 mg/dL (ref 8.6–10.3)
Chloride: 102 mmol/L (ref 98–110)
Creat: 1.18 mg/dL (ref 0.70–1.30)
Globulin: 2.3 g/dL (ref 1.9–3.7)
Glucose, Bld: 89 mg/dL (ref 65–99)
Potassium: 4.6 mmol/L (ref 3.5–5.3)
Sodium: 137 mmol/L (ref 135–146)
Total Bilirubin: 0.6 mg/dL (ref 0.2–1.2)
Total Protein: 7 g/dL (ref 6.1–8.1)
eGFR: 73 mL/min/1.73m2

## 2024-10-07 LAB — TSH: TSH: 1.56 m[IU]/L (ref 0.40–4.50)

## 2024-10-07 MED ORDER — LOSARTAN POTASSIUM-HCTZ 50-12.5 MG PO TABS: 2.0000 | ORAL_TABLET | Freq: Every day | ORAL | 3 refills | Status: AC

## 2024-10-07 MED ORDER — BUSPIRONE HCL 30 MG PO TABS: 30.0000 mg | ORAL_TABLET | Freq: Two times a day (BID) | ORAL | 1 refills | Status: DC

## 2024-10-07 NOTE — Progress Notes (Signed)
 "  Subjective:    Patient ID: RENEE BEALE, male    DOB: 24-Nov-1970, 54 y.o.   MRN: [id number] Patient's blood pressure has recently been significantly elevated.  Systolic blood pressures are averaging between 140 and 160.  Diastolic blood pressures are 90-100.  He denies any chest pain or shortness of breath.  He attributes this to weight gain.  His weight has gradually increased to 290 pounds.  His BMI is 40.  Today in our clinic his blood pressure is 144/98.  This is despite taking verapamil which he takes for chronic headaches as well as losartan  hydrochlorothiazide 50/12.5 daily that he takes for hypertension.  He does report significant fatigue.  He is concerned that he may have hypogonadism.  He believes the fatigue is making it harder for him to exercise and lose weight. Past Medical History:  Diagnosis Date   DDD (degenerative disc disease), lumbar    Dysplastic nevus 02/24/2016   L chest - mild    Dysplastic nevus 08/11/2021   Left xyphoid - moderate   Hypertension    Low back pain    Migraine    Obesity    Post concussion syndrome    Current Outpatient Medications on File Prior to Visit  Medication Sig Dispense Refill   acetaminophen  (TYLENOL ) 500 MG tablet Take 1,000 mg by mouth every 6 (six) hours as needed for moderate pain.     albuterol  (VENTOLIN  HFA) 108 (90 Base) MCG/ACT inhaler INHALE 1-2 PUFFS BY MOUTH EVERY 6 HOURS AS NEEDED FOR WHEEZE OR SHORTNESS OF BREATH 18 each 1   ALPRAZolam  (XANAX ) 0.5 MG tablet TAKE 1 TABLET (0.5 MG TOTAL) BY MOUTH 3 (THREE) TIMES DAILY AS NEEDED FOR ANXIETY. 90 tablet 0   azithromycin  (ZITHROMAX ) 250 MG tablet 2 tabs poqday1, 1 tab poqday 2-5 6 tablet 0   benzonatate  (TESSALON  PERLES) 100 MG capsule Take 1 capsule (100 mg total) by mouth 3 (three) times daily as needed. 30 capsule 0   chlorpheniramine-HYDROcodone  (TUSSIONEX) 10-8 MG/5ML Take 5 mLs by mouth every 12 (twelve) hours as needed for cough. 120 mL 0   eletriptan (RELPAX) 40 MG  tablet Take 40 mg by mouth daily as needed for migraine. One tablet by mouth at onset of headache. May repeat in 2 hours if headache persists or recurs.     fluticasone  (FLONASE ) 50 MCG/ACT nasal spray Place 2 sprays into both nostrils daily. 1 g 11   fluticasone -salmeterol (ADVAIR HFA) 115-21 MCG/ACT inhaler Inhale 2 puffs into the lungs 2 (two) times daily. 1 each 12   fluticasone -salmeterol (ADVAIR) 250-50 MCG/ACT AEPB Inhale 1 puff into the lungs every 12 (twelve) hours. 60 each 5   HYDROcodone -acetaminophen  (NORCO/VICODIN) 5-325 MG tablet Take 1 tablet by mouth every 6 (six) hours as needed for moderate pain. 90 tablet 0   linaclotide  (LINZESS ) 145 MCG CAPS capsule Take 1 capsule (145 mcg total) by mouth daily before breakfast. 90 capsule 3   meclizine  (ANTIVERT ) 25 MG tablet Take 1 tablet (25 mg total) by mouth 3 (three) times daily as needed. 30 tablet 0   meloxicam  (MOBIC ) 7.5 MG tablet Take 1 tablet (7.5 mg total) by mouth in the morning and at bedtime. 60 tablet 3   pantoprazole  (PROTONIX ) 40 MG tablet TAKE 1 TABLET BY MOUTH EVERY DAY 90 tablet 1   predniSONE  (DELTASONE ) 20 MG tablet 3 tabs poqday 1-2, 2 tabs poqday 3-4, 1 tab poqday 5-6 12 tablet 0   Roflumilast  (ZORYVE ) 0.3 % CREA Apply 1  application  topically daily. qd to aa rash scalp, and groin prn flares 60 g 11   sildenafil  (VIAGRA ) 100 MG tablet Take 0.5-1 tablets (50-100 mg total) by mouth daily as needed for erectile dysfunction. 5 tablet 11   tiZANidine (ZANAFLEX) 4 MG tablet Take 4-8 mg by mouth at bedtime as needed.     triamcinolone  ointment (KENALOG ) 0.1 % Apply to bottom of foot nightly 5 days a week m-f as needed for peeling 30 g 6   valACYclovir  (VALTREX ) 1000 MG tablet Take 1 tablet (1,000 mg total) by mouth as directed. Take 2 po with first symptoms of fever blister then 2 po 12 hours later 30 tablet 11   verapamil (CALAN-SR) 240 MG CR tablet Take 240 mg by mouth daily.  3   No current facility-administered medications  on file prior to visit.   Allergies  Allergen Reactions   Amoxicillin -Pot Clavulanate Nausea And Vomiting and Other (See Comments)    Did not feel right, had it recently   Social History   Socioeconomic History   Marital status: Married    Spouse name: Not on file   Number of children: Not on file   Years of education: Not on file   Highest education level: Not on file  Occupational History   Not on file  Tobacco Use   Smoking status: Never   Smokeless tobacco: Never  Vaping Use   Vaping status: Never Used  Substance and Sexual Activity   Alcohol  use: Yes    Comment: Rare   Drug use: No   Sexual activity: Not on file    Comment: Married, works for loss adjuster, [employer].  Other Topics Concern   Not on file  Social History Narrative   Not on file   Social Drivers of Health   Tobacco Use: Low Risk (10/07/2024)   Patient History    Smoking Tobacco Use: Never    Smokeless Tobacco Use: Never    Passive Exposure: Not on file  Financial Resource Strain: Low Risk (02/21/2023)   Received from [facility]   Overall Financial Resource Strain (CARDIA)    Difficulty of Paying Living Expenses: Not very hard  Food Insecurity: No Food Insecurity (06/09/2024)   Received from [facility]   Epic    Within the past 12 months, you worried that your food would run out before you got the money to buy more.: Never true    Within the past 12 months, the food you bought just didn't last and you didn't have money to get more.: Never true  Transportation Needs: No Transportation Needs (06/09/2024)   Received from [facility]    In the past 12 months, has lack of transportation kept you from medical appointments or from getting medications?: No    In the past 12 months, has lack of transportation kept you from meetings, work, or from getting things needed for daily living?: No  Physical Activity: Unknown (10/22/2022)   Received from [facility]   Exercise Vital Sign    On average, how many  days per week do you engage in moderate to strenuous exercise (like a brisk walk)?: Patient declined    Minutes of Exercise per Session: Not on file  Stress: Patient Declined (10/22/2022)   Received from [facility] - Occupational Stress Questionnaire    Feeling of Stress : Patient declined  Social Connections: Not on file  Intimate Partner Violence: Not At Risk (02/05/2024)  Received from [facility]   HITS    Over the last 12 months how often did your partner physically hurt you?: Never    Over the last 12 months how often did your partner insult you or talk down to you?: Never    Over the last 12 months how often did your partner threaten you with physical harm?: Never    Over the last 12 months how often did your partner scream or curse at you?: Never  Depression (PHQ2-9): Low Risk (10/07/2024)   Depression (PHQ2-9)    PHQ-2 Score: 0  Alcohol  Screen: Not on file  Housing: Unknown (06/09/2024)   Received from [facility]    In the last 12 months, was there a time when you were not able to pay the mortgage or rent on time?: No    Number of Times Moved in the Last Year: Not on file    At any time in the past 12 months, were you homeless or living in a shelter (including now)?: No  Utilities: Not At Risk (06/09/2024)   Received from [facility]    In the past 12 months has the electric, gas, oil, or water company threatened to shut off services in your home?: No  Health Literacy: Not on file   Family History  Problem Relation Age of Onset   Diabetes Father    Heart disease Father    Hyperlipidemia Father    Hypertension Father      Review of Systems  Gastrointestinal:  Positive for abdominal pain.  All other systems reviewed and are negative.      Objective:   Physical Exam Vitals reviewed.  Constitutional:      General: He is not in acute distress.    Appearance: He is well-developed. He is not diaphoretic.   HENT:     Head: Normocephalic and atraumatic.     Right Ear: External ear normal.     Left Ear: External ear normal.     Nose: Nose normal.  Eyes:     General:        Right eye: No discharge.        Left eye: No discharge.     Conjunctiva/sclera: Conjunctivae normal.  Neck:     Thyroid: No thyromegaly.     Vascular: No JVD.     Trachea: No tracheal deviation.  Cardiovascular:     Rate and Rhythm: Normal rate and regular rhythm.     Heart sounds: Normal heart sounds. No murmur heard.    No friction rub. No gallop.  Pulmonary:     Effort: Pulmonary effort is normal. No respiratory distress.     Breath sounds: No stridor. No wheezing, rhonchi or rales.  Chest:     Chest wall: No tenderness.  Abdominal:     General: Bowel sounds are normal. There is no distension.     Palpations: Abdomen is soft. There is no mass.     Tenderness: There is no abdominal tenderness. There is no guarding or rebound.  Musculoskeletal:        General: No tenderness or deformity.     Cervical back: Normal range of motion and neck supple.  Lymphadenopathy:     Cervical: No cervical adenopathy.  Neurological:     Mental Status: He is alert and oriented to person, place, and time.     Cranial Nerves: No cranial nerve deficit.     Sensory: No sensory deficit.  Motor: No abnormal muscle tone.     Coordination: Coordination normal.           Assessment & Plan:  Fatigue, unspecified type - Plan: Comprehensive metabolic panel with GFR, TSH, Testosterone  Total,Free,Bio, Males  Benign essential HTN - Plan: losartan -hydrochlorothiazide (HYZAAR) 50-12.5 MG tablet Blood pressure is elevated today.  Increase losartan /hydrochlorothiazide 50/12.5 to 2 tablets daily.  Recheck blood pressure in 2 weeks.  Given his fatigue I will check a CMP, a TSH, and a testosterone  level.  If there is an elevated blood sugar I plan to add a hemoglobin A1c.  If his hemoglobin A1c is elevated he would benefit from a GLP-1  agonist such as Mounjaro.  Patient does not want to increase BuSpar  to 30 mg twice daily for anxiety as he continues to deal with daily anxiety which he thinks is adding to his hypertension "

## 2024-10-09 ENCOUNTER — Ambulatory Visit: Payer: Self-pay | Admitting: Family Medicine

## 2024-10-25 ENCOUNTER — Other Ambulatory Visit: Payer: Self-pay | Admitting: Family Medicine

## 2024-10-27 NOTE — Telephone Encounter (Signed)
 Requested Prescriptions  Pending Prescriptions Disp Refills   meloxicam  (MOBIC ) 7.5 MG tablet [Pharmacy Med Name: MELOXICAM  7.5 MG TABLET] 60 tablet 3    Sig: TAKE 1 TABLET (7.5 MG TOTAL) BY MOUTH IN THE MORNING AND AT BEDTIME.     Analgesics:  COX2 Inhibitors Failed - 10/27/2024 11:41 AM      Failed - Manual Review: Labs are only required if the patient has taken medication for more than 8 weeks.      Passed - HGB in normal range and within 360 days    Hemoglobin  Date Value Ref Range Status  04/22/2024 13.7 13.2 - 17.1 g/dL Final         Passed - Cr in normal range and within 360 days    Creat  Date Value Ref Range Status  10/07/2024 1.18 0.70 - 1.30 mg/dL Final         Passed - HCT in normal range and within 360 days    HCT  Date Value Ref Range Status  04/22/2024 41.6 38.5 - 50.0 % Final         Passed - AST in normal range and within 360 days    AST  Date Value Ref Range Status  10/07/2024 30 10 - 35 U/L Final         Passed - ALT in normal range and within 360 days    ALT  Date Value Ref Range Status  10/07/2024 24 9 - 46 U/L Final         Passed - eGFR is 30 or above and within 360 days    GFR, Est African American  Date Value Ref Range Status  12/31/2020 78 > OR = 60 mL/min/1.37m2 Final   GFR, Est Non African American  Date Value Ref Range Status  12/31/2020 67 > OR = 60 mL/min/1.36m2 Final   GFR, Estimated  Date Value Ref Range Status  05/25/2021 >60 >60 mL/min Final    Comment:    (NOTE) Calculated using the CKD-EPI Creatinine Equation (2021)    eGFR  Date Value Ref Range Status  10/07/2024 73 > OR = 60 mL/min/1.37m2 Final         Passed - Patient is not pregnant      Passed - Valid encounter within last 12 months    Recent Outpatient Visits           2 weeks ago Fatigue, unspecified type   [place] [facility] Pickard, Butler DASEN, MD   3 months ago Bronchitis   [place] [facility] Duanne, Butler DASEN,  MD   3 months ago Flu vaccine need   [place] [facility] Duanne Butler DASEN, MD   6 months ago General medical exam   [place] [facility] Duanne Butler DASEN, MD   6 months ago Pneumonia of left upper lobe due to infectious organism   [place] [facility] Aletha Bene, MD       Future Appointments             In 1 month Hester Alm BROCKS, MD [facility]

## 2024-10-29 ENCOUNTER — Other Ambulatory Visit: Payer: Self-pay | Admitting: Family Medicine

## 2024-10-31 ENCOUNTER — Encounter: Payer: Self-pay | Admitting: Family Medicine

## 2024-10-31 ENCOUNTER — Ambulatory Visit: Admitting: Family Medicine

## 2024-10-31 VITALS — BP 136/88 | HR 89 | Temp 98.5°F | Ht 71.0 in | Wt 296.0 lb

## 2024-10-31 DIAGNOSIS — I10 Essential (primary) hypertension: Secondary | ICD-10-CM | POA: Diagnosis not present

## 2024-10-31 DIAGNOSIS — Z23 Encounter for immunization: Secondary | ICD-10-CM | POA: Diagnosis not present

## 2024-10-31 DIAGNOSIS — E66813 Obesity, class 3: Secondary | ICD-10-CM | POA: Diagnosis not present

## 2024-10-31 DIAGNOSIS — Z6841 Body Mass Index (BMI) 40.0 and over, adult: Secondary | ICD-10-CM | POA: Diagnosis not present

## 2024-10-31 MED ORDER — WEGOVY 1.5 MG PO TABS: 1.5000 mg | ORAL_TABLET | Freq: Every day | ORAL | 1 refills | Status: AC

## 2024-10-31 NOTE — Addendum Note (Signed)
 Addended by: ANGELENA RONAL BRADLEY K on: 10/31/2024 02:12 PM   Modules accepted: Orders

## 2024-10-31 NOTE — Progress Notes (Signed)
 "  Subjective:    Patient ID: Darren Baker, male    DOB: [birth year], 54 y.o.   MRN: [id number]  Patient is here today to recheck his blood pressure.  His blood pressure at home has been as high as 150/96.  He is currently taking losartan  hydrochlorothiazide 50/12.5, 2 tablets daily.  The majority of his blood pressures however are better than this.  Unfortunately, the patient's weight continues to be an issue for him.  He weighs 296 pounds today.  His BMI is 41.3.  He reports uncontrolled appetite.  He states that he feels like he is eating because he is anxious.  He has not seen benefit from BuSpar .  We initially increase the BuSpar  gradually up to 30 mg twice daily however he felt the medication was ineffective so he started weaning himself down and is currently on 15 mg twice daily.  He denies any chest pain.  He denies any shortness of breath.  He denies any dyspnea on exertion.  He is interested in options for weight loss.  Past Medical History:  Diagnosis Date   DDD (degenerative disc disease), lumbar    Dysplastic nevus 02/24/2016   L chest - mild    Dysplastic nevus 08/11/2021   Left xyphoid - moderate   Hypertension    Low back pain    Migraine    Obesity    Post concussion syndrome    Current Outpatient Medications on File Prior to Visit  Medication Sig Dispense Refill   acetaminophen  (TYLENOL ) 500 MG tablet Take 1,000 mg by mouth every 6 (six) hours as needed for moderate pain.     albuterol  (VENTOLIN  HFA) 108 (90 Base) MCG/ACT inhaler INHALE 1-2 PUFFS BY MOUTH EVERY 6 HOURS AS NEEDED FOR WHEEZE OR SHORTNESS OF BREATH 18 each 1   ALPRAZolam  (XANAX ) 0.5 MG tablet TAKE 1 TABLET (0.5 MG TOTAL) BY MOUTH 3 (THREE) TIMES DAILY AS NEEDED FOR ANXIETY. 90 tablet 0   azithromycin  (ZITHROMAX ) 250 MG tablet 2 tabs poqday1, 1 tab poqday 2-5 6 tablet 0   busPIRone  (BUSPAR ) 30 MG tablet TAKE 1 TABLET (30 MG TOTAL) BY MOUTH IN THE MORNING AND AT BEDTIME. 180 tablet 1    chlorpheniramine-HYDROcodone  (TUSSIONEX) 10-8 MG/5ML Take 5 mLs by mouth every 12 (twelve) hours as needed for cough. 120 mL 0   eletriptan (RELPAX) 40 MG tablet Take 40 mg by mouth daily as needed for migraine. One tablet by mouth at onset of headache. May repeat in 2 hours if headache persists or recurs.     fluticasone  (FLONASE ) 50 MCG/ACT nasal spray Place 2 sprays into both nostrils daily. 1 g 11   fluticasone -salmeterol (ADVAIR HFA) 115-21 MCG/ACT inhaler Inhale 2 puffs into the lungs 2 (two) times daily. 1 each 12   fluticasone -salmeterol (ADVAIR) 250-50 MCG/ACT AEPB Inhale 1 puff into the lungs every 12 (twelve) hours. 60 each 5   HYDROcodone -acetaminophen  (NORCO/VICODIN) 5-325 MG tablet Take 1 tablet by mouth every 6 (six) hours as needed for moderate pain. 90 tablet 0   linaclotide  (LINZESS ) 145 MCG CAPS capsule Take 1 capsule (145 mcg total) by mouth daily before breakfast. 90 capsule 3   losartan -hydrochlorothiazide (HYZAAR) 50-12.5 MG tablet Take 2 tablets by mouth daily. 180 tablet 3   meclizine  (ANTIVERT ) 25 MG tablet Take 1 tablet (25 mg total) by mouth 3 (three) times daily as needed. 30 tablet 0   meloxicam  (MOBIC ) 7.5 MG tablet TAKE 1 TABLET (7.5 MG TOTAL) BY MOUTH IN THE MORNING  AND AT BEDTIME. 60 tablet 3   pantoprazole  (PROTONIX ) 40 MG tablet TAKE 1 TABLET BY MOUTH EVERY DAY 90 tablet 1   Roflumilast  (ZORYVE ) 0.3 % CREA Apply 1 application  topically daily. qd to aa rash scalp, and groin prn flares 60 g 11   sildenafil  (VIAGRA ) 100 MG tablet Take 0.5-1 tablets (50-100 mg total) by mouth daily as needed for erectile dysfunction. 5 tablet 11   tiZANidine (ZANAFLEX) 4 MG tablet Take 4-8 mg by mouth at bedtime as needed.     triamcinolone  ointment (KENALOG ) 0.1 % Apply to bottom of foot nightly 5 days a week m-f as needed for peeling 30 g 6   valACYclovir  (VALTREX ) 1000 MG tablet Take 1 tablet (1,000 mg total) by mouth as directed. Take 2 po with first symptoms of fever blister then  2 po 12 hours later 30 tablet 11   verapamil (CALAN-SR) 240 MG CR tablet Take 240 mg by mouth daily.  3   benzonatate  (TESSALON  PERLES) 100 MG capsule Take 1 capsule (100 mg total) by mouth 3 (three) times daily as needed. (Patient not taking: Reported on 10/31/2024) 30 capsule 0   predniSONE  (DELTASONE ) 20 MG tablet 3 tabs poqday 1-2, 2 tabs poqday 3-4, 1 tab poqday 5-6 (Patient not taking: Reported on 10/31/2024) 12 tablet 0   No current facility-administered medications on file prior to visit.   Allergies  Allergen Reactions   Amoxicillin -Pot Clavulanate Nausea And Vomiting and Other (See Comments)    Did not feel right, had it recently   Social History   Socioeconomic History   Marital status: Married    Spouse name: Not on file   Number of children: Not on file   Years of education: Not on file   Highest education level: Not on file  Occupational History   Not on file  Tobacco Use   Smoking status: Never   Smokeless tobacco: Never  Vaping Use   Vaping status: Never Used  Substance and Sexual Activity   Alcohol  use: Yes    Comment: Rare   Drug use: No   Sexual activity: Not on file    Comment: Married, works for loss adjuster, [employer].  Other Topics Concern   Not on file  Social History Narrative   Not on file   Social Drivers of Health   Tobacco Use: Low Risk (10/31/2024)   Patient History    Smoking Tobacco Use: Never    Smokeless Tobacco Use: Never    Passive Exposure: Not on file  Financial Resource Strain: Low Risk (02/21/2023)   Received from [facility]   Overall Financial Resource Strain (CARDIA)    Difficulty of Paying Living Expenses: Not very hard  Food Insecurity: No Food Insecurity (06/09/2024)   Received from [facility]   Epic    Within the past 12 months, you worried that your food would run out before you got the money to buy more.: Never true    Within the past 12 months, the food you bought just didn't last and you didn't have money to get more.:  Never true  Transportation Needs: No Transportation Needs (06/09/2024)   Received from [facility]    In the past 12 months, has lack of transportation kept you from medical appointments or from getting medications?: No    In the past 12 months, has lack of transportation kept you from meetings, work, or from getting things needed for daily living?: No  Physical Activity: Unknown (10/22/2022)  Received from [facility]   Exercise Vital Sign    On average, how many days per week do you engage in moderate to strenuous exercise (like a brisk walk)?: Patient declined    Minutes of Exercise per Session: Not on file  Stress: Patient Declined (10/22/2022)   Received from [facility] - Occupational Stress Questionnaire    Feeling of Stress : Patient declined  Social Connections: Not on file  Intimate Partner Violence: Not At Risk (02/05/2024)   Received from [facility]   HITS    Over the last 12 months how often did your partner physically hurt you?: Never    Over the last 12 months how often did your partner insult you or talk down to you?: Never    Over the last 12 months how often did your partner threaten you with physical harm?: Never    Over the last 12 months how often did your partner scream or curse at you?: Never  Depression (PHQ2-9): Low Risk (10/07/2024)   Depression (PHQ2-9)    PHQ-2 Score: 0  Alcohol  Screen: Not on file  Housing: Unknown (06/09/2024)   Received from [facility]    In the last 12 months, was there a time when you were not able to pay the mortgage or rent on time?: No    Number of Times Moved in the Last Year: Not on file    At any time in the past 12 months, were you homeless or living in a shelter (including now)?: No  Utilities: Not At Risk (06/09/2024)   Received from [facility]    In the past 12 months has the electric, gas, oil, or water company threatened to shut off services in your  home?: No  Health Literacy: Not on file   Family History  Problem Relation Age of Onset   Diabetes Father    Heart disease Father    Hyperlipidemia Father    Hypertension Father      Review of Systems  Gastrointestinal:  Positive for abdominal pain.  All other systems reviewed and are negative.      Objective:   Physical Exam Vitals reviewed.  Constitutional:      General: He is not in acute distress.    Appearance: He is well-developed. He is not diaphoretic.  HENT:     Head: Normocephalic and atraumatic.     Right Ear: External ear normal.     Left Ear: External ear normal.     Nose: Nose normal.  Eyes:     General:        Right eye: No discharge.        Left eye: No discharge.     Conjunctiva/sclera: Conjunctivae normal.  Neck:     Thyroid: No thyromegaly.     Vascular: No JVD.     Trachea: No tracheal deviation.  Cardiovascular:     Rate and Rhythm: Normal rate and regular rhythm.     Heart sounds: Normal heart sounds. No murmur heard.    No friction rub. No gallop.  Pulmonary:     Effort: Pulmonary effort is normal. No respiratory distress.     Breath sounds: No stridor. No wheezing, rhonchi or rales.  Chest:     Chest wall: No tenderness.  Abdominal:     General: Bowel sounds are normal. There is no distension.     Palpations: Abdomen is soft. There is no  mass.     Tenderness: There is no abdominal tenderness. There is no guarding or rebound.  Musculoskeletal:        General: No tenderness or deformity.     Cervical back: Normal range of motion and neck supple.  Lymphadenopathy:     Cervical: No cervical adenopathy.  Neurological:     Mental Status: He is alert and oriented to person, place, and time.     Cranial Nerves: No cranial nerve deficit.     Sensory: No sensory deficit.     Motor: No abnormal muscle tone.     Coordination: Coordination normal.           Assessment & Plan:  Benign essential HTN  BMI 40.0-44.9, adult  (HCC) Patient's blood pressure is borderline today.  Together we have decided to split up his losartan  hydrochlorothiazide and take it twice a day to try to distribute the medicine more evenly throughout the day to see if this would afford better control.  He is already on verapamil to help with headaches.  The next step would be to add a beta-blocker to lower his blood pressure such as Bystolic.  I want to avoid this because of fatigue.  We checked his TSH and his testosterone  level at his last visit and these were normal although his testosterone  level was borderline low.  Therefore I believe we can help lower his blood pressure if we can help him lose weight.  We have discussed this and he would like to try to lose weight.  Will start the patient on Wegovy  1.5 mg p.o. daily.  Will increase as tolerated on a monthly basis up to the maximum tolerated dose.  Hopefully over the next 3 to 4 months if the patient can lose 15 pounds, we will see his blood pressure improved with his weight.    Patient does not feel that BuSpar  is helpful.  Therefore I recommended reducing BuSpar  to 7.5 mg twice daily and then discontinuing the medication altogether 1 week after doing that.  He continues to use Xanax  but he takes less than a pill a day on average.  90 pills lasted more than 3 months.  He seems to think that this helps his anxiety is much as anything that we have tried. "

## 2024-12-02 ENCOUNTER — Ambulatory Visit: Admitting: Dermatology

## 2025-04-30 ENCOUNTER — Other Ambulatory Visit

## 2025-05-05 ENCOUNTER — Encounter: Admitting: Family Medicine
